# Patient Record
Sex: Female | Born: 1935 | Race: White | Hispanic: No | Marital: Married | Smoking: Never smoker
Health system: Southern US, Community
[De-identification: ages and names within clinical notes are randomized; demographics above are authoritative.]

## PROBLEM LIST (undated history)

## (undated) DIAGNOSIS — M545 Low back pain, unspecified: Secondary | ICD-10-CM

## (undated) DIAGNOSIS — I10 Essential (primary) hypertension: Secondary | ICD-10-CM

## (undated) DIAGNOSIS — I2510 Atherosclerotic heart disease of native coronary artery without angina pectoris: Secondary | ICD-10-CM

## (undated) DIAGNOSIS — E669 Obesity, unspecified: Secondary | ICD-10-CM

## (undated) HISTORY — DX: Low back pain, unspecified: M54.50

## (undated) HISTORY — DX: Obesity, unspecified: E66.9

## (undated) HISTORY — PX: CORONARY STENT PLACEMENT: SHX1402

## (undated) HISTORY — DX: Essential (primary) hypertension: I10

## (undated) HISTORY — DX: Atherosclerotic heart disease of native coronary artery without angina pectoris: I25.10

## (undated) HISTORY — PX: APPENDECTOMY: SHX54

## (undated) HISTORY — DX: Low back pain: M54.5

---

## 2001-08-25 ENCOUNTER — Other Ambulatory Visit: Admission: RE | Admit: 2001-08-25 | Discharge: 2001-08-25 | Payer: Self-pay | Admitting: Internal Medicine

## 2004-09-05 ENCOUNTER — Other Ambulatory Visit: Admission: RE | Admit: 2004-09-05 | Discharge: 2004-09-05 | Payer: Self-pay | Admitting: Internal Medicine

## 2007-09-08 ENCOUNTER — Other Ambulatory Visit: Admission: RE | Admit: 2007-09-08 | Discharge: 2007-09-08 | Payer: Self-pay | Admitting: Internal Medicine

## 2009-04-09 ENCOUNTER — Encounter: Payer: Self-pay | Admitting: Cardiovascular Disease

## 2009-06-04 ENCOUNTER — Ambulatory Visit: Payer: Self-pay | Admitting: Cardiovascular Disease

## 2009-06-04 ENCOUNTER — Encounter (INDEPENDENT_AMBULATORY_CARE_PROVIDER_SITE_OTHER): Payer: Self-pay

## 2009-06-04 DIAGNOSIS — I2510 Atherosclerotic heart disease of native coronary artery without angina pectoris: Secondary | ICD-10-CM | POA: Insufficient documentation

## 2009-06-04 DIAGNOSIS — R072 Precordial pain: Secondary | ICD-10-CM | POA: Insufficient documentation

## 2009-06-04 DIAGNOSIS — I10 Essential (primary) hypertension: Secondary | ICD-10-CM | POA: Insufficient documentation

## 2009-06-05 ENCOUNTER — Inpatient Hospital Stay (HOSPITAL_COMMUNITY): Admission: RE | Admit: 2009-06-05 | Discharge: 2009-06-11 | Payer: Self-pay | Admitting: Cardiovascular Disease

## 2009-06-05 ENCOUNTER — Ambulatory Visit: Payer: Self-pay | Admitting: Cardiovascular Disease

## 2009-06-06 ENCOUNTER — Encounter: Payer: Self-pay | Admitting: Cardiovascular Disease

## 2009-06-10 ENCOUNTER — Encounter: Payer: Self-pay | Admitting: Cardiovascular Disease

## 2009-06-18 ENCOUNTER — Encounter: Payer: Self-pay | Admitting: Cardiovascular Disease

## 2009-06-26 DIAGNOSIS — M109 Gout, unspecified: Secondary | ICD-10-CM | POA: Insufficient documentation

## 2009-06-26 DIAGNOSIS — E669 Obesity, unspecified: Secondary | ICD-10-CM | POA: Insufficient documentation

## 2009-06-26 DIAGNOSIS — E119 Type 2 diabetes mellitus without complications: Secondary | ICD-10-CM | POA: Insufficient documentation

## 2009-06-26 DIAGNOSIS — M545 Low back pain, unspecified: Secondary | ICD-10-CM | POA: Insufficient documentation

## 2009-07-01 ENCOUNTER — Ambulatory Visit: Payer: Self-pay | Admitting: Cardiovascular Disease

## 2009-07-15 ENCOUNTER — Encounter: Payer: Self-pay | Admitting: Cardiovascular Disease

## 2009-07-17 ENCOUNTER — Ambulatory Visit: Payer: Self-pay | Admitting: Cardiovascular Disease

## 2009-07-18 LAB — CONVERTED CEMR LAB
CO2: 27 meq/L (ref 19–32)
Chloride: 109 meq/L (ref 96–112)
Glucose, Bld: 130 mg/dL — ABNORMAL HIGH (ref 70–99)
Sodium: 144 meq/L (ref 135–145)

## 2009-08-05 ENCOUNTER — Encounter: Payer: Self-pay | Admitting: Cardiovascular Disease

## 2009-09-24 ENCOUNTER — Ambulatory Visit: Payer: Self-pay | Admitting: Cardiovascular Disease

## 2009-09-27 DIAGNOSIS — I2589 Other forms of chronic ischemic heart disease: Secondary | ICD-10-CM | POA: Insufficient documentation

## 2009-10-16 ENCOUNTER — Ambulatory Visit: Payer: Self-pay

## 2009-10-16 ENCOUNTER — Ambulatory Visit (HOSPITAL_COMMUNITY): Admission: RE | Admit: 2009-10-16 | Discharge: 2009-10-16 | Payer: Self-pay | Admitting: Cardiovascular Disease

## 2009-10-16 ENCOUNTER — Encounter: Payer: Self-pay | Admitting: Cardiovascular Disease

## 2009-10-16 ENCOUNTER — Ambulatory Visit: Payer: Self-pay | Admitting: Cardiology

## 2010-04-07 ENCOUNTER — Ambulatory Visit: Payer: Self-pay | Admitting: Cardiovascular Disease

## 2010-04-07 DIAGNOSIS — E785 Hyperlipidemia, unspecified: Secondary | ICD-10-CM | POA: Insufficient documentation

## 2010-08-14 NOTE — Miscellaneous (Signed)
Summary: [facility]  [facility]   Imported By: Roderic Ovens 08/09/2009 10:53:30  _____________________________________________________________________  External Attachment:    Type:   Image     Comment:   External Document

## 2010-08-14 NOTE — Miscellaneous (Signed)
Summary: [facility]  [facility]   Imported By: Kassie Mends 08/26/2009 08:32:42  _____________________________________________________________________  External Attachment:    Type:   Image     Comment:   External Document

## 2010-08-14 NOTE — Letter (Signed)
Summary: [facility]  [facility]   Imported By: Marylou Mccoy 07/31/2009 18:01:44  _____________________________________________________________________  External Attachment:    Type:   Image     Comment:   External Document

## 2010-08-14 NOTE — Assessment & Plan Note (Signed)
Summary: ROV   Visit Type:  Follow-up Primary Provider:  Nila Kelly  CC:  No complaints.  History of Present Illness: This is a 77 year-old woman with CAD presenting today for follow-up evaluation. The pt presented for outpatient eval after a prolonged episode of chest pain in November 2010. She was referred for cath, and was diagnosed with a NSTEMI. Cath showed severe LCx stenosis and she was treated with a DES. She was found to have moderate LV dysfunction with LVEF 41% by MRI at the time of her NSTEMI. Followup echo has shown normalization of LV function with an LVEF of 55%.  The patient feels well. She has lost another 13 pounds since her last visit. Remains limited by arthritis. She denies chest pain, dysnpea, or edema. No other complaints.    Current Medications (verified): 1)  Probenecid 500 Mg Tabs (Probenecid) .Marland Kitchen.. 1 Tab Once Daily 2)  Aspirin 81 Mg Tbec (Aspirin) .... Take One Tablet By Mouth Daily 3)  Carvedilol 6.25 Mg Tabs (Carvedilol) .... Take One Tablet By Mouth Twice A Day 4)  Plavix 75 Mg Tabs (Clopidogrel Bisulfate) .... Take One Tablet By Mouth Daily 5)  Lipitor 80 Mg Tabs (Atorvastatin Calcium) .... Take One Tablet By Mouth Daily. 6)  Diovan Hct 160-25 Mg Tabs (Valsartan-Hydrochlorothiazide) .... Take One Tablet By Mouth Daily 7)  Aspirin 81 Mg Tbec (Aspirin) .... Take One Tablet By Mouth Daily  Allergies: 1)  ! Allopurinol 2)  ! Sulfa  Past History:  Past medical history reviewed for relevance to current acute and chronic problems.  Past Medical History: Reviewed history from 07/01/2009 and no changes required. Current Problems:  CORONARY ATHEROSCLEROSIS NATIVE CORONARY ARTERY (ICD-414.01), NSTEMI 11/10, LCx stent HYPERTENSION, BENIGN (ICD-401.1) CHEST PAIN, PRECORDIAL (ICD-786.51) OBESITY (ICD-278.00) DIABETES MELLITUS, TYPE II (ICD-250.00) GOUT, UNSPECIFIED (ICD-274.9) LOW BACK PAIN, CHRONIC (ICD-724.2)  Review of Systems       Negative except as  per HPI   Vital Signs:  Patient profile:   77 year old female Height:      63 inches Weight:      194.50 pounds BMI:     34.58 Pulse rate:   68 / minute Pulse rhythm:   regular Resp:     18 per minute BP sitting:   130 / 78  (left arm) Cuff size:   large  Vitals Entered By: Vikki Ports (April 07, 2010 1:47 PM)  Physical Exam  General:  Pt is alert and oriented, elderly, obese, very pleasant woman, in no acute distress. HEENT: normal Neck: normal carotid upstrokes without bruits, JVP normal Lungs: CTA CV: RRR without murmur or gallop Abd: soft, NT, positive BS, no bruit, no organomegaly Ext: no clubbing, cyanosis, or edema. peripheral pulses 2+ and equal Skin: warm and dry without rash    Echocardiogram  Procedure date:  10/16/2009  Findings:      Study Conclusions            - Left ventricle: The cavity size was normal. Systolic function was       normal. The estimated ejection fraction was in the range of 50% to       55%. Wall motion was normal; there were no regional wall motion       abnormalities. Doppler parameters are consistent with abnormal       left ventricular relaxation (grade 1 diastolic dysfunction).     - Mitral valve: Mild regurgitation.     - Right ventricle: The cavity size was mildly dilated.  Systolic       function was moderately reduced.     - Right atrium: The atrium was mildly dilated.  EKG  Procedure date:  04/07/2010  Findings:      NSR, Low voltage QRS, HR 69 bpm.  Impression & Recommendations:  Problem # 1:  CORONARY ATHEROSCLEROSIS NATIVE CORONARY ARTERY (ICD-414.01) Pt stable without angina. Risk factors well-controlled. She has done a great job with weight loss, expecially considering her inability to exercise. She should continue ASA and plavix for a total of 12 months from her PCI procedure (stop date 06/08/2011).  Her updated medication list for this problem includes:    Aspirin 81 Mg Tbec (Aspirin) .Marland Kitchen... Take one  tablet by mouth daily    Carvedilol 6.25 Mg Tabs (Carvedilol) .Marland Kitchen... Take one tablet by mouth twice a day    Plavix 75 Mg Tabs (Clopidogrel bisulfate) .Marland Kitchen... Take one tablet by mouth daily  Orders: EKG w/ Interpretation (93000)  Problem # 2:  HYPERTENSION, BENIGN (ICD-401.1) Well-controlled.  Her updated medication list for this problem includes:    Aspirin 81 Mg Tbec (Aspirin) .Marland Kitchen... Take one tablet by mouth daily    Carvedilol 6.25 Mg Tabs (Carvedilol) .Marland Kitchen... Take one tablet by mouth twice a day    Diovan Hct 160-25 Mg Tabs (Valsartan-hydrochlorothiazide) .Marland Kitchen... Take one tablet by mouth daily  Orders: EKG w/ Interpretation (93000)  BP today: 130/78 Prior BP: 130/70 (09/24/2009)  Labs Reviewed: K+: 4.3 (07/17/2009) Creat: : 1.3 (07/17/2009)     Problem # 3:  HYPERLIPIDEMIA-MIXED (ICD-272.4) Lipids followed by Dr Chilton Si - he recently increased lipitor to 80 mg.  Her updated medication list for this problem includes:    Lipitor 80 Mg Tabs (Atorvastatin calcium) .Marland Kitchen... Take one tablet by mouth daily.  Patient Instructions: 1)  Your physician recommends that you continue on your current medications as directed. Please refer to the Current Medication list given to you today.  YOU CAN STOP PLAVIX June 07, 2010. 2)  Your physician wants you to follow-up in:  6 MONTHS.  You will receive a reminder letter in the mail two months in advance. If you don't receive a letter, please call our office to schedule the follow-up appointment.

## 2010-08-14 NOTE — Miscellaneous (Signed)
Summary: [facility]  [facility]   Imported By: Roderic Ovens 07/29/2009 12:25:21  _____________________________________________________________________  External Attachment:    Type:   Image     Comment:   External Document

## 2010-08-14 NOTE — Miscellaneous (Signed)
Summary: Home Health Certification/Care Plan   Home Health Certification/Care Plan   Imported By: Roderic Ovens 07/29/2009 12:27:36  _____________________________________________________________________  External Attachment:    Type:   Image     Comment:   External Document

## 2010-08-14 NOTE — Assessment & Plan Note (Signed)
Summary: 3 MONTH ROV   Visit Type:  3 months follow up Primary Provider:  Nila Nephew  CC:  No complaints.  History of Present Illness: This is a 77 year-old woman with CAD presenting today for follow-up evaluation. The pt presented for outpatient eval after a prolonged episode of chest pain in November 2010. She was referred for cath, and was diagnosed with a NSTEMI. Cath showed severe LCx stenosis and she was treated with a DES.  She was found to have moderate LV dysfunction with LVEF 41% by MRI at the time of her NSTEMI.  The pt is doing well at present. She denies chest pain, dyspnea, edema, orthopnea, or PND. She has followed a diet and has lost 18 pounds since her last visit. She's unable to exercise much because of arthritis.  Current Medications (verified): 1)  Probenecid 500 Mg Tabs (Probenecid) .Marland Kitchen.. 1 Tab Once Daily 2)  Amaryl 2 Mg Tabs (Glimepiride) .... 1/2 Tab Once Daily 3)  Aspirin Ec 325 Mg Tbec (Aspirin) .... Take One Tablet By Mouth Daily 4)  Carvedilol 6.25 Mg Tabs (Carvedilol) .... Take One Tablet By Mouth Twice A Day 5)  Plavix 75 Mg Tabs (Clopidogrel Bisulfate) .... Take One Tablet By Mouth Daily 6)  Lipitor 40 Mg Tabs (Atorvastatin Calcium) .... Take One Tablet By Mouth Daily. 7)  Tylenol Extra Strength 500 Mg Tabs (Acetaminophen) .... As Needed 8)  Diovan Hct 160-25 Mg Tabs (Valsartan-Hydrochlorothiazide) .... Take One Tablet By Mouth Daily  Allergies: 1)  ! Allopurinol 2)  ! Sulfa  Past History:  Past medical history reviewed for relevance to current acute and chronic problems.  Past Medical History: Reviewed history from 07/01/2009 and no changes required. Current Problems:  CORONARY ATHEROSCLEROSIS NATIVE CORONARY ARTERY (ICD-414.01), NSTEMI 11/10, LCx stent HYPERTENSION, BENIGN (ICD-401.1) CHEST PAIN, PRECORDIAL (ICD-786.51) OBESITY (ICD-278.00) DIABETES MELLITUS, TYPE II (ICD-250.00) GOUT, UNSPECIFIED (ICD-274.9) LOW BACK PAIN, CHRONIC  (ICD-724.2)  Review of Systems       Positive for joint pains, otherwise negative except as per HPI  Vital Signs:  Patient profile:   77 year old female Height:      63 inches Weight:      206.75 pounds BMI:     36.76 Pulse rate:   72 / minute Pulse rhythm:   regular Resp:     18 per minute BP sitting:   130 / 70  (left arm) Cuff size:   large  Vitals Entered By: Vikki Ports (September 24, 2009 10:27 AM)  Physical Exam  General:  Pt is alert and oriented, elderly, obese, very pleasant woman, in no acute distress. HEENT: normal Neck: normal carotid upstrokes without bruits, JVP normal Lungs: CTA CV: RRR without murmur or gallop Abd: soft, NT, positive BS, no bruit, no organomegaly Ext: no clubbing, cyanosis, or edema. peripheral pulses 2+ and equal Skin: warm and dry without rash    Impression & Recommendations:  Problem # 1:  CORONARY ATHEROSCLEROSIS NATIVE CORONARY ARTERY (ICD-414.01) Stable without angina. Continue current medical therapy. Encouraged continued diet and as much exercise as she can tolerate.  Her updated medication list for this problem includes:    Aspirin 81 Mg Tbec (Aspirin) .Marland Kitchen... Take one tablet by mouth daily    Carvedilol 6.25 Mg Tabs (Carvedilol) .Marland Kitchen... Take one tablet by mouth twice a day    Plavix 75 Mg Tabs (Clopidogrel bisulfate) .Marland Kitchen... Take one tablet by mouth daily  Orders: Echocardiogram (Echo)  Problem # 2:  HYPERTENSION, BENIGN (ICD-401.1) well-controlled.  Her  updated medication list for this problem includes:    Aspirin 81 Mg Tbec (Aspirin) .Marland Kitchen... Take one tablet by mouth daily    Carvedilol 6.25 Mg Tabs (Carvedilol) .Marland Kitchen... Take one tablet by mouth twice a day    Diovan Hct 160-25 Mg Tabs (Valsartan-hydrochlorothiazide) .Marland Kitchen... Take one tablet by mouth daily  Orders: Echocardiogram (Echo)  BP today: 130/70 Prior BP: 165/82 (07/01/2009)  Labs Reviewed: K+: 4.3 (07/17/2009) Creat: : 1.3 (07/17/2009)     Problem # 3:   CARDIOMYOPATHY, ISCHEMIC (ICD-414.8) LVEF about 40% at the time of her infarct. Will reassess with echo. Pt on appropriate Rx with Coreg and ARB. Her updated medication list for this problem includes:    Aspirin 81 Mg Tbec (Aspirin) .Marland Kitchen... Take one tablet by mouth daily    Carvedilol 6.25 Mg Tabs (Carvedilol) .Marland Kitchen... Take one tablet by mouth twice a day    Plavix 75 Mg Tabs (Clopidogrel bisulfate) .Marland Kitchen... Take one tablet by mouth daily    Diovan Hct 160-25 Mg Tabs (Valsartan-hydrochlorothiazide) .Marland Kitchen... Take one tablet by mouth daily  Patient Instructions: 1)  Your physician wants you to follow-up in:   6 MONTHS. You will receive a reminder letter in the mail two months in advance. If you don't receive a letter, please call our office to schedule the follow-up appointment. 2)  Your physician has requested that you have an echocardiogram.  Echocardiography is a painless test that uses sound waves to create images of your heart. It provides your doctor with information about the size and shape of your heart and how well your heart's chambers and valves are working.  This procedure takes approximately one hour. There are no restrictions for this procedure. 3)  Your physician has recommended you make the following change in your medication: DECREASE Aspirin to 81mg  once a day

## 2010-10-11 ENCOUNTER — Emergency Department (HOSPITAL_COMMUNITY): Payer: Medicare Other

## 2010-10-11 ENCOUNTER — Inpatient Hospital Stay (HOSPITAL_COMMUNITY)
Admission: EM | Admit: 2010-10-11 | Discharge: 2010-10-15 | DRG: 392 | Disposition: A | Discharge status: Home Health | Payer: Medicare Other | Attending: Internal Medicine | Admitting: Internal Medicine

## 2010-10-11 DIAGNOSIS — K2970 Gastritis, unspecified, without bleeding: Principal | ICD-10-CM | POA: Diagnosis present

## 2010-10-11 DIAGNOSIS — Z9849 Cataract extraction status, unspecified eye: Secondary | ICD-10-CM

## 2010-10-11 DIAGNOSIS — N289 Disorder of kidney and ureter, unspecified: Secondary | ICD-10-CM | POA: Diagnosis present

## 2010-10-11 DIAGNOSIS — R0789 Other chest pain: Secondary | ICD-10-CM | POA: Diagnosis present

## 2010-10-11 DIAGNOSIS — E669 Obesity, unspecified: Secondary | ICD-10-CM | POA: Diagnosis present

## 2010-10-11 DIAGNOSIS — N189 Chronic kidney disease, unspecified: Secondary | ICD-10-CM | POA: Diagnosis present

## 2010-10-11 DIAGNOSIS — D696 Thrombocytopenia, unspecified: Secondary | ICD-10-CM | POA: Diagnosis not present

## 2010-10-11 DIAGNOSIS — I129 Hypertensive chronic kidney disease with stage 1 through stage 4 chronic kidney disease, or unspecified chronic kidney disease: Secondary | ICD-10-CM | POA: Diagnosis present

## 2010-10-11 DIAGNOSIS — M545 Low back pain, unspecified: Secondary | ICD-10-CM | POA: Diagnosis present

## 2010-10-11 DIAGNOSIS — K219 Gastro-esophageal reflux disease without esophagitis: Secondary | ICD-10-CM | POA: Diagnosis present

## 2010-10-11 DIAGNOSIS — K811 Chronic cholecystitis: Secondary | ICD-10-CM | POA: Diagnosis present

## 2010-10-11 DIAGNOSIS — R072 Precordial pain: Secondary | ICD-10-CM

## 2010-10-11 DIAGNOSIS — I2510 Atherosclerotic heart disease of native coronary artery without angina pectoris: Secondary | ICD-10-CM | POA: Diagnosis present

## 2010-10-11 DIAGNOSIS — M109 Gout, unspecified: Secondary | ICD-10-CM | POA: Diagnosis present

## 2010-10-11 DIAGNOSIS — Z9861 Coronary angioplasty status: Secondary | ICD-10-CM

## 2010-10-11 DIAGNOSIS — R7309 Other abnormal glucose: Secondary | ICD-10-CM | POA: Diagnosis present

## 2010-10-11 DIAGNOSIS — D638 Anemia in other chronic diseases classified elsewhere: Secondary | ICD-10-CM | POA: Diagnosis present

## 2010-10-11 DIAGNOSIS — I252 Old myocardial infarction: Secondary | ICD-10-CM

## 2010-10-11 DIAGNOSIS — E538 Deficiency of other specified B group vitamins: Secondary | ICD-10-CM | POA: Diagnosis present

## 2010-10-11 DIAGNOSIS — G8929 Other chronic pain: Secondary | ICD-10-CM | POA: Diagnosis present

## 2010-10-11 LAB — COMPREHENSIVE METABOLIC PANEL
ALT: 17 U/L (ref 0–35)
Albumin: 3.6 g/dL (ref 3.5–5.2)
Alkaline Phosphatase: 110 U/L (ref 39–117)
BUN: 37 mg/dL — ABNORMAL HIGH (ref 6–23)
Chloride: 106 mEq/L (ref 96–112)
Glucose, Bld: 114 mg/dL — ABNORMAL HIGH (ref 70–99)
Potassium: 4.4 mEq/L (ref 3.5–5.1)
Sodium: 139 mEq/L (ref 135–145)
Total Bilirubin: 0.5 mg/dL (ref 0.3–1.2)

## 2010-10-11 LAB — CK TOTAL AND CKMB (NOT AT ARMC)
CK, MB: 1.4 ng/mL (ref 0.3–4.0)
Relative Index: INVALID (ref 0.0–2.5)
Total CK: 72 U/L (ref 7–177)

## 2010-10-11 LAB — BASIC METABOLIC PANEL
Calcium: 8.6 mg/dL (ref 8.4–10.5)
GFR calc Af Amer: 39 mL/min — ABNORMAL LOW (ref >60)
GFR calc non Af Amer: 32 mL/min — ABNORMAL LOW (ref >60)
Potassium: 4.5 mEq/L (ref 3.5–5.1)
Sodium: 143 mEq/L (ref 135–145)

## 2010-10-11 LAB — CARDIAC PANEL(CRET KIN+CKTOT+MB+TROPI)
CK, MB: 1.3 ng/mL (ref 0.3–4.0)
Total CK: 55 U/L (ref 7–177)

## 2010-10-11 LAB — CBC
HCT: 32.2 % — ABNORMAL LOW (ref 36.0–46.0)
Hemoglobin: 10.3 g/dL — ABNORMAL LOW (ref 12.0–15.0)
Platelets: 151 10*3/uL (ref 150–400)
RBC: 3.35 MIL/uL — ABNORMAL LOW (ref 3.87–5.11)
RDW: 13.2 % (ref 11.5–15.5)

## 2010-10-11 LAB — TROPONIN I: Troponin I: 0.01 ng/mL (ref 0.00–0.06)

## 2010-10-11 LAB — GLUCOSE, CAPILLARY
Glucose-Capillary: 121 mg/dL — ABNORMAL HIGH (ref 70–99)
Glucose-Capillary: 152 mg/dL — ABNORMAL HIGH (ref 70–99)

## 2010-10-12 DIAGNOSIS — I4891 Unspecified atrial fibrillation: Secondary | ICD-10-CM

## 2010-10-12 LAB — HEMOGLOBIN AND HEMATOCRIT, BLOOD: Hemoglobin: 8.5 g/dL — ABNORMAL LOW (ref 12.0–15.0)

## 2010-10-12 LAB — CBC
Hemoglobin: 8.7 g/dL — ABNORMAL LOW (ref 12.0–15.0)
MCH: 31.4 pg (ref 26.0–34.0)
Platelets: 125 10*3/uL — ABNORMAL LOW (ref 150–400)
RBC: 2.77 MIL/uL — ABNORMAL LOW (ref 3.87–5.11)
WBC: 11.2 10*3/uL — ABNORMAL HIGH (ref 4.0–10.5)

## 2010-10-12 LAB — GLUCOSE, CAPILLARY
Glucose-Capillary: 121 mg/dL — ABNORMAL HIGH (ref 70–99)
Glucose-Capillary: 90 mg/dL (ref 70–99)
Glucose-Capillary: 156 mg/dL — ABNORMAL HIGH (ref 70–99)

## 2010-10-13 ENCOUNTER — Other Ambulatory Visit: Payer: Self-pay | Admitting: Gastroenterology

## 2010-10-13 DIAGNOSIS — D649 Anemia, unspecified: Secondary | ICD-10-CM

## 2010-10-13 DIAGNOSIS — K2970 Gastritis, unspecified, without bleeding: Secondary | ICD-10-CM

## 2010-10-13 DIAGNOSIS — K2990 Gastroduodenitis, unspecified, without bleeding: Secondary | ICD-10-CM

## 2010-10-13 DIAGNOSIS — R1013 Epigastric pain: Secondary | ICD-10-CM

## 2010-10-13 DIAGNOSIS — R079 Chest pain, unspecified: Secondary | ICD-10-CM

## 2010-10-13 LAB — HEMOGLOBIN AND HEMATOCRIT, BLOOD: HCT: 24.7 % — ABNORMAL LOW (ref 36.0–46.0)

## 2010-10-13 LAB — CBC
Hemoglobin: 8 g/dL — ABNORMAL LOW (ref 12.0–15.0)
Platelets: 120 10*3/uL — ABNORMAL LOW (ref 150–400)
RBC: 2.58 MIL/uL — ABNORMAL LOW (ref 3.87–5.11)
WBC: 8.1 10*3/uL (ref 4.0–10.5)

## 2010-10-13 LAB — GLUCOSE, CAPILLARY: Glucose-Capillary: 132 mg/dL — ABNORMAL HIGH (ref 70–99)

## 2010-10-13 LAB — CARDIAC PANEL(CRET KIN+CKTOT+MB+TROPI)
CK, MB: 1.8 ng/mL (ref 0.3–4.0)
Relative Index: 1.6 (ref 0.0–2.5)
Total CK: 110 U/L (ref 7–177)
Troponin I: 0.05 ng/mL (ref 0.00–0.06)

## 2010-10-13 LAB — DIFFERENTIAL
Basophils Absolute: 0 10*3/uL (ref 0.0–0.1)
Basophils Relative: 0 % (ref 0–1)
Eosinophils Absolute: 0.2 10*3/uL (ref 0.0–0.7)
Monocytes Relative: 8 % (ref 3–12)
Neutro Abs: 6.4 10*3/uL (ref 1.7–7.7)
Neutrophils Relative %: 79 % — ABNORMAL HIGH (ref 43–77)

## 2010-10-14 ENCOUNTER — Inpatient Hospital Stay (HOSPITAL_COMMUNITY): Payer: Medicare Other

## 2010-10-14 DIAGNOSIS — K2990 Gastroduodenitis, unspecified, without bleeding: Secondary | ICD-10-CM

## 2010-10-14 DIAGNOSIS — K8000 Calculus of gallbladder with acute cholecystitis without obstruction: Secondary | ICD-10-CM

## 2010-10-14 DIAGNOSIS — K2970 Gastritis, unspecified, without bleeding: Secondary | ICD-10-CM

## 2010-10-14 LAB — CBC
Hemoglobin: 8.4 g/dL — ABNORMAL LOW (ref 12.0–15.0)
MCH: 31.1 pg (ref 26.0–34.0)
MCV: 95.9 fL (ref 78.0–100.0)
Platelets: 130 10*3/uL — ABNORMAL LOW (ref 150–400)
RBC: 2.7 MIL/uL — ABNORMAL LOW (ref 3.87–5.11)
WBC: 6.4 10*3/uL (ref 4.0–10.5)

## 2010-10-14 LAB — VITAMIN B12: Vitamin B-12: 178 pg/mL — ABNORMAL LOW (ref 211–911)

## 2010-10-14 LAB — BASIC METABOLIC PANEL
BUN: 42 mg/dL — ABNORMAL HIGH (ref 6–23)
CO2: 26 mEq/L (ref 19–32)
Chloride: 105 mEq/L (ref 96–112)
Potassium: 4.4 mEq/L (ref 3.5–5.1)

## 2010-10-14 LAB — FERRITIN: Ferritin: 264 ng/mL (ref 10–291)

## 2010-10-14 LAB — IRON AND TIBC
Iron: 32 ug/dL — ABNORMAL LOW (ref 42–135)
Saturation Ratios: 17 % — ABNORMAL LOW (ref 20–55)
UIBC: 151 ug/dL

## 2010-10-14 LAB — APTT: aPTT: 39 seconds — ABNORMAL HIGH (ref 24–37)

## 2010-10-15 DIAGNOSIS — R079 Chest pain, unspecified: Secondary | ICD-10-CM

## 2010-10-15 DIAGNOSIS — K8000 Calculus of gallbladder with acute cholecystitis without obstruction: Secondary | ICD-10-CM

## 2010-10-15 LAB — CBC
HCT: 25.5 % — ABNORMAL LOW (ref 36.0–46.0)
HCT: 26.2 % — ABNORMAL LOW (ref 36.0–46.0)
HCT: 27.1 % — ABNORMAL LOW (ref 36.0–46.0)
HCT: 28.1 % — ABNORMAL LOW (ref 36.0–46.0)
HCT: 30.1 % — ABNORMAL LOW (ref 36.0–46.0)
Hemoglobin: 10.3 g/dL — ABNORMAL LOW (ref 12.0–15.0)
Hemoglobin: 9 g/dL — ABNORMAL LOW (ref 12.0–15.0)
Hemoglobin: 9.6 g/dL — ABNORMAL LOW (ref 12.0–15.0)
MCHC: 32.5 g/dL (ref 30.0–36.0)
MCHC: 33.9 g/dL (ref 30.0–36.0)
MCHC: 34.2 g/dL (ref 30.0–36.0)
MCHC: 34.2 g/dL (ref 30.0–36.0)
MCV: 95.5 fL (ref 78.0–100.0)
MCV: 97.6 fL (ref 78.0–100.0)
MCV: 97.7 fL (ref 78.0–100.0)
MCV: 98.1 fL (ref 78.0–100.0)
MCV: 98.5 fL (ref 78.0–100.0)
MCV: 98.7 fL (ref 78.0–100.0)
Platelets: 132 10*3/uL — ABNORMAL LOW (ref 150–400)
Platelets: 168 10*3/uL (ref 150–400)
Platelets: 194 10*3/uL (ref 150–400)
Platelets: 195 10*3/uL (ref 150–400)
RBC: 2.88 MIL/uL — ABNORMAL LOW (ref 3.87–5.11)
RBC: 3.06 MIL/uL — ABNORMAL LOW (ref 3.87–5.11)
RDW: 13.6 % (ref 11.5–15.5)
RDW: 12.9 % (ref 11.5–15.5)
RDW: 13 % (ref 11.5–15.5)
RDW: 13.2 % (ref 11.5–15.5)
RDW: 13.4 % (ref 11.5–15.5)
RDW: 13.6 % (ref 11.5–15.5)
WBC: 6.9 10*3/uL (ref 4.0–10.5)
WBC: 7.1 10*3/uL (ref 4.0–10.5)
WBC: 8.2 10*3/uL (ref 4.0–10.5)

## 2010-10-15 LAB — COMPREHENSIVE METABOLIC PANEL
AST: 34 U/L (ref 0–37)
Albumin: 2.8 g/dL — ABNORMAL LOW (ref 3.5–5.2)
Alkaline Phosphatase: 77 U/L (ref 39–117)
BUN: 34 mg/dL — ABNORMAL HIGH (ref 6–23)
BUN: 38 mg/dL — ABNORMAL HIGH (ref 6–23)
CO2: 28 mEq/L (ref 19–32)
Calcium: 8.3 mg/dL — ABNORMAL LOW (ref 8.4–10.5)
Chloride: 105 mEq/L (ref 96–112)
GFR calc non Af Amer: 20 mL/min — ABNORMAL LOW (ref >60)
Glucose, Bld: 141 mg/dL — ABNORMAL HIGH (ref 70–99)
Potassium: 4.2 mEq/L (ref 3.5–5.1)
Total Bilirubin: 0.7 mg/dL (ref 0.3–1.2)
Total Protein: 5.7 g/dL — ABNORMAL LOW (ref 6.0–8.3)

## 2010-10-15 LAB — BASIC METABOLIC PANEL
BUN: 37 mg/dL — ABNORMAL HIGH (ref 6–23)
BUN: 21 mg/dL (ref 6–23)
BUN: 36 mg/dL — ABNORMAL HIGH (ref 6–23)
BUN: 38 mg/dL — ABNORMAL HIGH (ref 6–23)
BUN: 39 mg/dL — ABNORMAL HIGH (ref 6–23)
CO2: 22 mEq/L (ref 19–32)
Calcium: 8.3 mg/dL — ABNORMAL LOW (ref 8.4–10.5)
Calcium: 8.2 mg/dL — ABNORMAL LOW (ref 8.4–10.5)
Calcium: 8.4 mg/dL (ref 8.4–10.5)
Chloride: 101 mEq/L (ref 96–112)
Chloride: 104 mEq/L (ref 96–112)
Chloride: 108 mEq/L (ref 96–112)
Creatinine, Ser: 1.41 mg/dL — ABNORMAL HIGH (ref 0.4–1.2)
Creatinine, Ser: 1.43 mg/dL — ABNORMAL HIGH (ref 0.4–1.2)
Creatinine, Ser: 1.79 mg/dL — ABNORMAL HIGH (ref 0.4–1.2)
Creatinine, Ser: 2.28 mg/dL — ABNORMAL HIGH (ref 0.4–1.2)
GFR calc non Af Amer: 31 mL/min — ABNORMAL LOW (ref >60)
GFR calc non Af Amer: 21 mL/min — ABNORMAL LOW (ref >60)
GFR calc non Af Amer: 28 mL/min — ABNORMAL LOW (ref >60)
GFR calc non Af Amer: 32 mL/min — ABNORMAL LOW (ref >60)
Glucose, Bld: 142 mg/dL — ABNORMAL HIGH (ref 70–99)
Glucose, Bld: 156 mg/dL — ABNORMAL HIGH (ref 70–99)
Glucose, Bld: 163 mg/dL — ABNORMAL HIGH (ref 70–99)
Glucose, Bld: 175 mg/dL — ABNORMAL HIGH (ref 70–99)
Potassium: 5 mEq/L (ref 3.5–5.1)
Sodium: 140 mEq/L (ref 135–145)

## 2010-10-15 LAB — BRAIN NATRIURETIC PEPTIDE: Pro B Natriuretic peptide (BNP): 918 pg/mL — ABNORMAL HIGH (ref 0.0–100.0)

## 2010-10-15 LAB — APTT: aPTT: 27 seconds (ref 24–37)

## 2010-10-15 LAB — MAGNESIUM
Magnesium: 1.4 mg/dL — ABNORMAL LOW (ref 1.5–2.5)
Magnesium: 1.6 mg/dL (ref 1.5–2.5)

## 2010-10-15 LAB — GLUCOSE, CAPILLARY
Glucose-Capillary: 127 mg/dL — ABNORMAL HIGH (ref 70–99)
Glucose-Capillary: 129 mg/dL — ABNORMAL HIGH (ref 70–99)
Glucose-Capillary: 136 mg/dL — ABNORMAL HIGH (ref 70–99)
Glucose-Capillary: 141 mg/dL — ABNORMAL HIGH (ref 70–99)
Glucose-Capillary: 159 mg/dL — ABNORMAL HIGH (ref 70–99)
Glucose-Capillary: 165 mg/dL — ABNORMAL HIGH (ref 70–99)
Glucose-Capillary: 167 mg/dL — ABNORMAL HIGH (ref 70–99)
Glucose-Capillary: 95 mg/dL (ref 70–99)

## 2010-10-15 LAB — CARDIAC PANEL(CRET KIN+CKTOT+MB+TROPI)
CK, MB: 22.5 ng/mL — ABNORMAL HIGH (ref 0.3–4.0)
Relative Index: 6.2 — ABNORMAL HIGH (ref 0.0–2.5)
Relative Index: 7.9 — ABNORMAL HIGH (ref 0.0–2.5)
Relative Index: 7.9 — ABNORMAL HIGH (ref 0.0–2.5)
Total CK: 303 U/L — ABNORMAL HIGH (ref 7–177)
Troponin I: 12.63 ng/mL (ref 0.00–0.06)

## 2010-10-15 LAB — FERRITIN: Ferritin: 228 ng/mL (ref 10–291)

## 2010-10-15 LAB — DIFFERENTIAL
Basophils Absolute: 0 10*3/uL (ref 0.0–0.1)
Basophils Relative: 0 % (ref 0–1)
Eosinophils Absolute: 0.1 10*3/uL (ref 0.0–0.7)
Lymphs Abs: 1 10*3/uL (ref 0.7–4.0)
Neutrophils Relative %: 76 % (ref 43–77)

## 2010-10-15 LAB — PROTIME-INR
INR: 1.1 (ref 0.00–1.49)
Prothrombin Time: 14.1 seconds (ref 11.6–15.2)

## 2010-10-15 LAB — IRON AND TIBC: TIBC: 179 ug/dL — ABNORMAL LOW (ref 250–470)

## 2010-10-15 LAB — HEMOGLOBIN A1C
Hgb A1c MFr Bld: 6.7 % — ABNORMAL HIGH (ref 4.6–6.1)
Mean Plasma Glucose: 146 mg/dL

## 2010-10-15 LAB — TSH: TSH: 3.085 u[IU]/mL (ref 0.350–4.500)

## 2010-10-15 LAB — LIPID PANEL: VLDL: 33 mg/dL (ref 0–40)

## 2010-10-15 LAB — HEPARIN LEVEL (UNFRACTIONATED): Heparin Unfractionated: 0.37 IU/mL (ref 0.30–0.70)

## 2010-10-16 NOTE — Discharge Summary (Addendum)
NAMEJAYLIN, [name]                  ACCOUNT NO.:  [id number]  MEDICAL RECORD NO.:  [id number]           PATIENT TYPE:  I  LOCATION:  3735                         FACILITY:  [facility]  PHYSICIAN:  Jarquavious Fentress M. [place], M.D.  DATE OF BIRTH:  Mar 15, 1931  DATE OF ADMISSION:  10/11/2010 DATE OF DISCHARGE:  10/15/2010                              DISCHARGE SUMMARY   DISCHARGE DIAGNOSES: 1. Chest pain, felt secondary to gastritis by EGD October 13, 2010, with     negative pathology for Helicobacter pylori/malignancy. 2. Possible chronic cholecystitis, for referral to Surgery for     possible laparoscopic cholecystectomy. 3. Coronary artery disease, status post non-ST elevation myocardial     infarction on November 2010, status post percutaneous transluminal     coronary angioplasty/drug-eluting stent placement to the left     circumflex.     a.     Cardiac enzymes negative x2 with third set with a troponin      of 0.09 and fourth set completely negative, not felt to reflect      ischemia.     b.     Aspirin on hold, to be resumed when okay with GI. 4. Acute-on-chronic renal insufficiency with discharge creatinine of     1.61. 5. Anemia, with panel reflective of anemia of chronic disease with     possibly component of iron deficiency/B12 deficiency, for followup     with PCP. 6. Obesity. 7. Hypertension. 8. History of gout. 9. Prediabetes with possible conversion to diabetes with blood sugars     running from 90 to 180 at this admission, for outpatient followup     with PCP. 10.Chronic low back pain. 11.History of appendectomy.  HOSPITAL COURSE:  [name] is a 77 year old female with a prior history of CAD, hypertension, possible prediabetes, who presented to [facility], complaints of substernal chest pain compatible with symptoms of prior angina.  It was a steady discomfort, but not associated with any diaphoresis or shortness of breath.  She had an episode of GERD and water brash  that evening related to having a hamburger later on that day.  Her initial cardiac enzymes were negative and EKG showed sinus rhythm with low voltage without acute changes.  Her chest pain was felt somewhat atypical, possibly related to GERD symptoms.  Second set of cardiac markers came back negative and third set showed a troponin of 0.09, but this was felt not to reflect acute ischemia.  She was also found to be anemic with a hemoglobin of 8.7 and thrombocytopenia with the platelet count in the 120s to 130s.  The Lovenox that she had been initially started on for possible unstable angina was discontinued and aspirin was also discontinued.  GI was called, she was seen in consultation by [facility].  She was prescribed PPI therapy and felt she is a suitable candidate for EGD with compelling symptoms.  Rectal exam was negative by Laura Moccasin, [name].  EGD was on October 13, 2010, which showed moderate gastritis, spontaneously oozing of blood, felt to be able to explain a source of abdominal pain/chest pain.  Pathology showed ulcerated and reactive gastric antral mucosa with no evidence for H. pylori, metaplasia, dysplasia, or malignancy.  An abdominal ultrasound was also obtained, in the setting of normal LFTs, which demonstrated abnormal gallbladder with thickened wall and abundant internal sludge and small stones, suspicion for cholecystitis was elicited by the report, however, her findings were felt more consistent with chronic cholecystitis.  She was felt to benefit from a surgical referral for laparoscopic cholecystectomy.  Anemia panel was drawn which did show evidence for decreased iron, but decreased percent saturation and TIBC as well as a low B12 indicating possibly more so anemia of chronic disease.  The patient continued to do well without complaints of chest pain, nausea, vomiting, shortness of breath.  Her outpatient appointments were set up with GI and Surgery.  She  already has an appointment with Dr. Excell Seltzer.  Dr. [place] has seen and examined her today and feels she is stable for discharge.  DISCHARGE LABS:  WBC 5.7, hemoglobin 8.3, hematocrit 25.5, platelet count 132.  Sodium 142, potassium 4.1, chloride 106, CO2 27, glucose 142, BUN 37, creatinine 1.61.  LFTs were normal on October 11, 2010.  Iron 32, TIBC 183, percent saturation 17, UIBC 151, vitamin B12 178, folate 7.3, ferritin 264.  STUDIES: 1. Chest x-ray, October 11, 2010, showed low lung volumes without acute     findings. 2. Abdominal ultrasound, October 10, 2010, showed abnormal gallbladder     with thickened wall and abundant internal sludge and small  stones.     No sonographic Eulah Pont sign could be elicited.  Clinical correlation     recommended the appearance suspicious for acute cholecystitis,     renal atrophy. 3. EGD, February 13, 2011, see hospital course for summary.  DISCHARGE MEDICATIONS: 1. Ferrous sulfate 325 mg daily. 2. Nitroglycerin sublingual 0.4 mg every 5 minutes as needed up to 3     doses for chest pain. 3. Protonix 40 mg daily. 4. Atorvastatin 80 mg nightly. 5. Coreg 6.25 mg b.i.d. with meals. 6. Diovan HCT 160/25 mg daily. 7. Systane eye drops OTC 1 drop both eyes daily as needed.  Please note, the patient's sildenafil was discontinued secondary to creatinine clearance of less than 50, estimated at 30 at the time of discharge.  Aspirin was also discontinued and will be restarted at the discussion of when Gastroenterology says this is okay.  She had not been on Plavix for several months.  DISPOSITION:  [name] will be discharged in stable condition to home. She has been instructed to increase activity slowly and follow a low- sodium heart-healthy diabetic diet.  She will follow up with Dr. Magnus Ivan on October 27, 2010, at 9:30 a.m. for her surgical consult regarding possible laparoscopic cholecystectomy for chronic cholecystitis.  She will follow up with Dr. Melvia Heaps once she has recovered from surgery.  She will follow up with Dr. Excell Seltzer as previously scheduled on October 29, 2010, at 8:45 a.m.  She was also instructed to follow up with her primary care provider in 1-2 weeks to monitor and evaluation of her kidney disease as well as anemia and diabetes.  DURATION OF DISCHARGE ENCOUNTER:  Greater than 30 minutes including physician and PA time.     Ronie Spies, P.A.C.   ______________________________ Laurabeth Yip M. [place], M.D.    DD/MEDQ  D:  10/15/2010  T:  10/16/2010  Job:  [id number]  cc:   Veverly Fells. Excell Seltzer, MD Abigail Miyamoto, M.D. Barbette Hair. Arlyce Dice, MD,FACG Lenon Curt Chilton Si, M.D.  Electronically Signed by Ronie Spies  on 10/16/2010 01:31:06 PM Electronically Signed by Jamelle Noy [place] M.D. on 10/28/2010 03:14:33 PM

## 2010-10-22 ENCOUNTER — Encounter: Payer: Self-pay | Admitting: Cardiovascular Disease

## 2010-10-22 NOTE — H&P (Signed)
[name], [name]                  ACCOUNT NO.:  [id number]  MEDICAL RECORD NO.:  [id number]           PATIENT TYPE:  E  LOCATION:  [facility]                         FACILITY:  [facility]  PHYSICIAN:  Colleen Can. Deborah Chalk, M.D.DATE OF BIRTH:  [birth year]  DATE OF ADMISSION:  10/11/2010 DATE OF DISCHARGE:                             HISTORY & PHYSICAL   REASON FOR ADMISSION:  Substernal chest pain.  HISTORY:  [name] is a 77 year old white female who presented with substernal chest pain that woke her at 4:50 a.m.  It is compatible with the symptoms of previous angina.  A substernal discomfort that was steady and radiated to her back and was associated with nausea.  She did not have diaphoresis or really significant shortness of breath.  She had an episode of gastroesophageal reflux and water brash that evening before she went to bed related to having a hamburger late in the day. She does have a history of drug-eluting stent to the left circumflex in November 2010.  It was a 2.5 x 15 mm Xience stent.  She has had renal insufficiency and a history of diabetes.  She does have some decreased mobility but she has lost 30 pounds since her heart attack and has lost her glucose intolerance.  PAST MEDICAL HISTORY:  She has pre diabetes.  Her blood sugars normalized with weight loss over the last 2 years.  She has had mild hypertension, yesterday blood pressure was 140/80 at a physical exam that she had.  She did have a physical exam with Dr. Murray Hodgkins yesterday.  She does have a history of gout.  PREVIOUS SURGERIES:  Stent in the left circumflex in 2010.  ALLERGIES:  ALLOPURINOL and SULFA.  FAMILY HISTORY:  Father died at age 56 of myocardial infarction.  Mother had a stroke at age 25 and died.  She has four brothers and one sister who are alive and well.  SOCIAL HISTORY:  She is married.  She has three children.  She previously worked at a lab at [employer], there are no cigarettes  or tobacco.  REVIEW OF SYSTEMS:  Review of systems are as discussed above.  All other findings are negative.  PHYSICAL EXAMINATION:  VITAL SIGNS:  Weight is not checked.  Blood pressure is 150/70, heart rates 78. HEENT:  Negative.  Pupils equal, round, and reactive to light.  Sclerae are nonicteric.  Oropharynx is clear. NECK:  Supple without bruits. LUNGS:  Reasonably clear with few crackles at the bases. HEART:  A regular rate and rhythm without gallop.  There is no murmur. ABDOMEN:  Obese. EXTREMITIES:  Without edema. NEUROLOGIC:  She is intact.  EKG shows sinus with low-voltage  Chest x-ray is unremarkable.  OVERALL IMPRESSION: 1. Chest pain, question of recurrent angina versus gastroesophageal     reflux. 2. Obesity. 3. Hypertension. 4. History of gout. 5. Pre diabetes.  PLAN:  We will admit to rule out myocardial infarction.  If it will be negative rule out for myocardial infarction, no recurrence of chest pain, then discharge tomorrow with plans for an outpatient Myoview.  She does have  a routine followup visit with Dr. Excell Seltzer on October 29, 2010, which would make a good post Myoview followup.  Our office visit already scheduled.  I think she is at low risk for this being recurrent ischemia.     Colleen Can. Deborah Chalk, M.D.     SNT/MEDQ  D:  10/11/2010  T:  10/12/2010  Job:  [id number]  cc:   Veverly Fells. Excell Seltzer, MD  Electronically Signed by Roger Shelter M.D. on 10/22/2010 11:16:34 AM

## 2010-10-29 ENCOUNTER — Encounter: Payer: Self-pay | Admitting: Cardiovascular Disease

## 2010-10-29 ENCOUNTER — Ambulatory Visit (INDEPENDENT_AMBULATORY_CARE_PROVIDER_SITE_OTHER): Payer: Medicare Other | Admitting: Cardiovascular Disease

## 2010-10-29 VITALS — BP 138/78 | HR 68 | Resp 18 | Ht 63.0 in | Wt 190.0 lb

## 2010-10-29 DIAGNOSIS — E785 Hyperlipidemia, unspecified: Secondary | ICD-10-CM

## 2010-10-29 DIAGNOSIS — I10 Essential (primary) hypertension: Secondary | ICD-10-CM

## 2010-10-29 DIAGNOSIS — I2510 Atherosclerotic heart disease of native coronary artery without angina pectoris: Secondary | ICD-10-CM

## 2010-10-29 NOTE — Assessment & Plan Note (Signed)
Lipids are followed by Dr. Chilton Si. Goal LDL is less than 100 and ideally less than 70.

## 2010-10-29 NOTE — Assessment & Plan Note (Signed)
The patient is stable without angina. Her EKG is unremarkable. She has had normalization of LV function. She has tolerated being off of both aspirin and Plavix without recurrent ischemic events. I think she can proceed with cholecystectomy without further cardiac evaluation. She reportedly has had an EGD for investigation of anemia but still require a colonoscopy. Once this investigation is complete I think she should resume aspirin 81 mg daily. Otherwise we'll continue her current medical program without changes.

## 2010-10-29 NOTE — Patient Instructions (Signed)
Your physician recommends that you continue on your current medications as directed. Please refer to the Current Medication list given to you today.  Your physician wants you to follow-up in: 1 YEAR.  You will receive a reminder letter in the mail two months in advance. If you don't receive a letter, please call our office to schedule the follow-up appointment.  You are cleared from a cardiac standpoint for upcoming Gallbladder surgery.

## 2010-10-29 NOTE — Progress Notes (Signed)
HPI:  This is a 77 year old woman presenting for preoperative cardiovascular evaluation. She has been followed here since 2010.  The patient presented with non-ST elevation infarction and was found to have severe proximal left circumflex stenosis, treated with a drug-eluting stent platform in November 2010.  From a cardiovascular standpoint she has done well since her interventional procedure and has had no recurrent ischemia.  The patient had 2 episodes of abdominal pain radiating to the back. She's had some pain into the right chest as well. These episodes and associated with nausea, vomiting, and diarrhea. A gallbladder ultrasound showed changes consistent with chronic cholecystitis and she has been referred for laparoscopic cholecystectomy.  The patient denies chest pain or pressure with activity. She denies dyspnea, edema, orthopnea, PND, or palpitations.   Outpatient Encounter Prescriptions as of 10/29/2010  Medication Sig Dispense Refill  . atorvastatin (LIPITOR) 80 MG tablet Take 80 mg by mouth daily.        . carvedilol (COREG) 6.25 MG tablet Take 6.25 mg by mouth 2 (two) times daily with a meal.        . ferrous sulfate 325 (65 FE) MG tablet Take 325 mg by mouth daily with breakfast.        . pantoprazole (PROTONIX) 40 MG tablet Take 40 mg by mouth daily.        . valsartan-hydrochlorothiazide (DIOVAN-HCT) 160-25 MG per tablet Take 1 tablet by mouth daily.        . probenecid (BENEMID) 500 MG tablet Take 500 mg by mouth daily.        Marland Kitchen DISCONTD: aspirin 81 MG tablet Take 81 mg by mouth daily.        Marland Kitchen DISCONTD: clopidogrel (PLAVIX) 75 MG tablet Take 75 mg by mouth daily.        Marland Kitchen DISCONTD: valsartan-hydrochlorothiazide (DIOVAN-HCT) 160-25 MG per tablet Take 1 tablet by mouth daily.         Allergies  Allergen Reactions  . Allopurinol   . Sulfonamide Derivatives     Past Medical History  Diagnosis Date  . Coronary atherosclerosis of native coronary artery     NSTEMI 11/10, LCx  stent  . Hypertension   . Chest pain   . Obesity   . Diabetes mellitus     type II  . Gout   . Low back pain     chronic    ROS: Negative except as per HPI  BP 138/78  Pulse 68  Resp 18  Ht 5\' 3"  (1.6 m)  Wt 190 lb (86.183 kg)  BMI 33.66 kg/m2  PHYSICAL EXAM: Pt is alert and oriented, obese woman, in NAD HEENT: normal Neck: JVP - normal, carotids 2+= without bruits Lungs: CTA bilaterally CV: RRR without murmur or gallop Abd: soft, NT, Positive BS, no hepatomegaly Ext: no C/C/E, distal pulses intact and equal Skin: warm/dry no rash  EKG:  Normal sinus rhythm 68 beats per minute, low voltage QRS, otherwise within normal limits.  ASSESSMENT AND PLAN:

## 2010-10-29 NOTE — Assessment & Plan Note (Signed)
The patient's blood pressure is well controlled. Will continue current medical program.

## 2010-11-18 ENCOUNTER — Encounter (HOSPITAL_COMMUNITY)
Admission: RE | Admit: 2010-11-18 | Discharge: 2010-11-18 | Disposition: A | Discharge status: Home / Self Care | Payer: Medicare Other | Source: Ambulatory Visit | Attending: Surgery | Admitting: Surgery

## 2010-11-18 ENCOUNTER — Ambulatory Visit (HOSPITAL_COMMUNITY)
Admission: RE | Admit: 2010-11-18 | Discharge: 2010-11-18 | Disposition: A | Discharge status: Home / Self Care | Payer: Medicare Other | Source: Ambulatory Visit | Attending: Surgery | Admitting: Surgery

## 2010-11-18 ENCOUNTER — Other Ambulatory Visit (HOSPITAL_COMMUNITY): Payer: Self-pay | Admitting: Surgery

## 2010-11-18 DIAGNOSIS — Z01812 Encounter for preprocedural laboratory examination: Secondary | ICD-10-CM | POA: Insufficient documentation

## 2010-11-18 DIAGNOSIS — K8020 Calculus of gallbladder without cholecystitis without obstruction: Secondary | ICD-10-CM | POA: Insufficient documentation

## 2010-11-18 DIAGNOSIS — Z01810 Encounter for preprocedural cardiovascular examination: Secondary | ICD-10-CM | POA: Insufficient documentation

## 2010-11-18 DIAGNOSIS — Z01818 Encounter for other preprocedural examination: Secondary | ICD-10-CM | POA: Insufficient documentation

## 2010-11-18 LAB — CBC
MCH: 30.9 pg (ref 26.0–34.0)
MCHC: 32.3 g/dL (ref 30.0–36.0)
Platelets: 142 10*3/uL — ABNORMAL LOW (ref 150–400)
RDW: 13.9 % (ref 11.5–15.5)

## 2010-11-18 LAB — DIFFERENTIAL
Basophils Absolute: 0 10*3/uL (ref 0.0–0.1)
Basophils Relative: 0 % (ref 0–1)
Eosinophils Absolute: 0.2 10*3/uL (ref 0.0–0.7)
Monocytes Absolute: 0.5 10*3/uL (ref 0.1–1.0)
Monocytes Relative: 8 % (ref 3–12)

## 2010-11-18 LAB — COMPREHENSIVE METABOLIC PANEL
AST: 18 U/L (ref 0–37)
Albumin: 3.6 g/dL (ref 3.5–5.2)
Calcium: 8.9 mg/dL (ref 8.4–10.5)
Creatinine, Ser: 1.31 mg/dL — ABNORMAL HIGH (ref 0.4–1.2)
GFR calc Af Amer: 48 mL/min — ABNORMAL LOW (ref >60)
GFR calc non Af Amer: 39 mL/min — ABNORMAL LOW (ref >60)
Total Protein: 6.3 g/dL (ref 6.0–8.3)

## 2010-11-19 ENCOUNTER — Observation Stay (HOSPITAL_COMMUNITY)
Admission: RE | Admit: 2010-11-19 | Discharge: 2010-11-20 | Disposition: A | Discharge status: Home / Self Care | Payer: Medicare Other | Source: Ambulatory Visit | Attending: Surgery | Admitting: Surgery

## 2010-11-19 ENCOUNTER — Other Ambulatory Visit: Payer: Self-pay | Admitting: Surgery

## 2010-11-19 ENCOUNTER — Encounter: Payer: Self-pay | Admitting: Internal Medicine

## 2010-11-19 DIAGNOSIS — Z9861 Coronary angioplasty status: Secondary | ICD-10-CM | POA: Insufficient documentation

## 2010-11-19 DIAGNOSIS — I2510 Atherosclerotic heart disease of native coronary artery without angina pectoris: Secondary | ICD-10-CM | POA: Insufficient documentation

## 2010-11-19 DIAGNOSIS — E119 Type 2 diabetes mellitus without complications: Secondary | ICD-10-CM | POA: Insufficient documentation

## 2010-11-19 DIAGNOSIS — K8020 Calculus of gallbladder without cholecystitis without obstruction: Principal | ICD-10-CM | POA: Insufficient documentation

## 2010-11-19 DIAGNOSIS — K219 Gastro-esophageal reflux disease without esophagitis: Secondary | ICD-10-CM | POA: Insufficient documentation

## 2010-11-19 LAB — GLUCOSE, CAPILLARY: Glucose-Capillary: 140 mg/dL — ABNORMAL HIGH (ref 70–99)

## 2010-11-26 NOTE — Op Note (Signed)
  [name], [name]                  ACCOUNT NO.:  [id number]  MEDICAL RECORD NO.:  [id number]           PATIENT TYPE:  O  LOCATION:  5118                         FACILITY:  [facility]  PHYSICIAN:  Abigail Miyamoto, M.D. DATE OF BIRTH:  [birth year]  DATE OF PROCEDURE:  11/19/2010 DATE OF DISCHARGE:                              OPERATIVE REPORT   PREOPERATIVE DIAGNOSIS:  Symptomatic cholelithiasis.  POSTOPERATIVE DIAGNOSIS:  Symptomatic cholelithiasis.  PROCEDURE:  Laparoscopic cholecystectomy.  SURGEON:  Abigail Miyamoto, MD  ANESTHESIA:  General and 0.5% Marcaine.  ESTIMATED BLOOD LOSS:  Minimal.  FINDINGS:  The patient was found to have a chronic scarred-appearing gallbladder with gallstones and sludge.  PROCEDURE IN DETAIL:  The patient was brought to the operating room, identified as [name].  She was placed supine on the operating table and general anesthesia was induced.  Her abdomen was then prepped and draped in usual sterile fashion.  Using a #15-blade, a small vertical incision was made above the umbilicus.  This was carried down to fascia, which was then opened with a scalpel.  A hemostat was then used to pass the peritoneal cavity under direct vision.  Next 0-Vicryl purse-string suture was placed around the fascial opening.  The [facility] port was placed through the opening and insufflation of the abdomen was begun.  A 5-mm port was then placed in the patient's epigastrium and two in the right upper quadrant; all under direct vision.  The gallbladder was grasped and retracted above the liver bed.  The cystic duct was easily dissected out and a critical window was achieved around it.  It was clipped three times proximally, once distally, and transected.  The cystic arteries were then identified and clipped three times proximally, once distally, and transected as well.  Gallbladder was then slowly dissected free from the liver bed with the electrocautery.  Once it  was free from the liver bed, it was placed in an endosac and removed through the incision at the umbilicus.  I then thoroughly irrigated the right upper quadrant with normal saline.  Again hemostasis appeared to be achieved.  0-Vicryl at the umbilicus was tied closing the fascial defect.  All incisions were anesthetized with Marcaine and closed with 4- 0 Monocryl subcuticular sutures.  Steri- Strips and Band-Aids were then applied.  The patient tolerated the procedure well.  All counts were correct at the end of the procedure. The patient was then extubated in operating room and taken in a stable condition to the recovery room.     Abigail Miyamoto, M.D.     DB/MEDQ  D:  11/19/2010  T:  11/20/2010  Job:  [id number]  Electronically Signed by Abigail Miyamoto M.D. on 11/26/2010 05:39:26 PM

## 2010-12-05 NOTE — Discharge Summary (Signed)
  [name], [name]                  ACCOUNT NO.:  [id number]  MEDICAL RECORD NO.:  [id number]           PATIENT TYPE:  O  LOCATION:  5118                         FACILITY:  [facility]  PHYSICIAN:  Abigail Miyamoto, M.D. DATE OF BIRTH:  [birth year]  DATE OF ADMISSION:  11/19/2010 DATE OF DISCHARGE:  11/20/2010                              DISCHARGE SUMMARY   DISCHARGE DIAGNOSIS:  Symptomatic cholelithiasis, status post laparoscopic cholecystectomy.  SUMMARY OF HISTORY:  This is a 77 year old female admitted for laparoscopic cholecystectomy.  SUMMARY OF HOSPITAL COURSE:  The patient was admitted and taken to the operating room where she underwent an uneventful laparoscopic cholecystectomy.  She tolerated the procedure well and was taken in a normal condition to regular surgical floor.  On postop day 1, she was doing quite well.  She had no complaints.  Her abdomen was soft.  Her incision was healing well.  Decision was made to discharge the patient to home.  DISCHARGE DIET:  Low fat.  DISCHARGE ACTIVITY:  As tolerated.  She will do no heavy lifting for 2 weeks.  Please see medical reconciliation form for her discharge medications.  DISCHARGE FOLLOWUP:  She will follow up with [facility] in 1-2 weeks postdischarge.     Abigail Miyamoto, M.D.     DB/MEDQ  D:  11/26/2010  T:  11/27/2010  Job:  [id number]  Electronically Signed by Abigail Miyamoto M.D. on 12/05/2010 08:57:24 PM

## 2010-12-24 ENCOUNTER — Encounter: Payer: Self-pay | Admitting: Internal Medicine

## 2011-04-10 ENCOUNTER — Other Ambulatory Visit: Payer: Self-pay | Admitting: Internal Medicine

## 2011-04-21 ENCOUNTER — Ambulatory Visit
Admission: RE | Admit: 2011-04-21 | Discharge: 2011-04-21 | Disposition: A | Discharge status: Home / Self Care | Payer: Medicare Other | Source: Ambulatory Visit | Attending: Internal Medicine | Admitting: Internal Medicine

## 2011-04-21 ENCOUNTER — Other Ambulatory Visit: Payer: Self-pay | Admitting: Internal Medicine

## 2011-04-21 DIAGNOSIS — M25511 Pain in right shoulder: Secondary | ICD-10-CM

## 2011-05-25 ENCOUNTER — Other Ambulatory Visit: Payer: Self-pay | Admitting: Internal Medicine

## 2011-07-08 ENCOUNTER — Other Ambulatory Visit: Payer: Self-pay | Admitting: Cardiovascular Disease

## 2011-10-07 ENCOUNTER — Other Ambulatory Visit: Payer: Self-pay | Admitting: Internal Medicine

## 2011-11-06 ENCOUNTER — Encounter: Payer: Self-pay | Admitting: Cardiovascular Disease

## 2011-11-06 ENCOUNTER — Ambulatory Visit (INDEPENDENT_AMBULATORY_CARE_PROVIDER_SITE_OTHER): Payer: Medicare Other | Admitting: Cardiovascular Disease

## 2011-11-06 VITALS — BP 132/68 | HR 69 | Ht 63.0 in | Wt 195.0 lb

## 2011-11-06 DIAGNOSIS — E785 Hyperlipidemia, unspecified: Secondary | ICD-10-CM

## 2011-11-06 DIAGNOSIS — I2510 Atherosclerotic heart disease of native coronary artery without angina pectoris: Secondary | ICD-10-CM

## 2011-11-06 DIAGNOSIS — I10 Essential (primary) hypertension: Secondary | ICD-10-CM

## 2011-11-06 NOTE — Patient Instructions (Signed)
Your physician wants you to follow-up in: 1 YEAR with Dr Cooper.  You will receive a reminder letter in the mail two months in advance. If you don't receive a letter, please call our office to schedule the follow-up appointment.  Your physician recommends that you continue on your current medications as directed. Please refer to the Current Medication list given to you today.  

## 2011-11-06 NOTE — Assessment & Plan Note (Signed)
Lipids are followed by Dr. Chilton Si. She is on atorvastatin 80 mg. I discussed consideration of the ACCELERATE Trial and she will review the protocol and follow-up with our research staff if interested. This is a randomized controlled trial evaluating the efficacy and safety of HDL-raising therapy with Evacetrapib.

## 2011-11-06 NOTE — Assessment & Plan Note (Signed)
Blood pressure is well controlled on current medical program. 

## 2011-11-06 NOTE — Assessment & Plan Note (Signed)
The patient is stable without anginal symptoms. She is on an appropriate medical program which includes aspirin for antiplatelet therapy, a statin drug, a beta blocker, and an ARB. She will continue on her current medicines and followup in 1 year.

## 2011-11-06 NOTE — Progress Notes (Signed)
   HPI:  78 year old woman presenting for followup evaluation. The patient is followed for coronary artery disease after presenting with non-ST elevation infarction in 2010. At that time, she underwent cardiac catheterization demonstrating critical stenosis of left circumflex and she was treated with a drug-eluting stent. She has had no recurrent ischemic events. The patient had laparoscopic cholecystectomy last year and had no cardiovascular problems around surgery.  She is physically inactive because of limitations related to osteoarthritis. She denies chest pain, chest pressure, dyspnea, edema, or palpitations. She is on a stable program of medications and relates no adverse effects.  Outpatient Encounter Prescriptions as of 11/06/2011  Medication Sig Dispense Refill  . aspirin 81 MG tablet Take 81 mg by mouth daily.      Marland Kitchen atorvastatin (LIPITOR) 80 MG tablet Take 80 mg by mouth daily.        . carvedilol (COREG) 6.25 MG tablet take 1 tablet by mouth twice a day  60 tablet  11  . ferrous sulfate 325 (65 FE) MG tablet Take 325 mg by mouth daily with breakfast.        . furosemide (LASIX) 40 MG tablet Take 40 mg by mouth daily. 1/2 tablet daily      . pantoprazole (PROTONIX) 40 MG tablet take 1 tablet by mouth once daily  30 tablet  5  . probenecid (BENEMID) 500 MG tablet Take 500 mg by mouth 2 (two) times daily.       . valsartan-hydrochlorothiazide (DIOVAN-HCT) 160-25 MG per tablet Take 1 tablet by mouth daily.          Allergies  Allergen Reactions  . Allopurinol   . Sulfonamide Derivatives     Past Medical History  Diagnosis Date  . Coronary atherosclerosis of native coronary artery     NSTEMI 11/10, LCx stent  . Hypertension   . Chest pain   . Obesity   . Diabetes mellitus     type II  . Gout   . Low back pain     chronic    ROS: Negative except as per HPI  BP 132/68  Pulse 69  Ht 5\' 3"  (1.6 m)  Wt 88.451 kg (195 lb)  BMI 34.54 kg/m2  PHYSICAL EXAM: Pt is alert and  oriented, obese woman in NAD HEENT: normal Neck: JVP - normal, carotids 2+= without bruits Lungs: CTA bilaterally CV: RRR without murmur or gallop Abd: soft, NT, Positive BS, no hepatomegaly Ext: no C/C/E, distal pulses intact and equal Skin: warm/dry no rash  EKG:  Normal sinus rhythm 69 beats per minute, low voltage QRS, cannot rule out age-indeterminate anterior infarct.  ASSESSMENT AND PLAN:

## 2012-05-09 IMAGING — CR DG CHEST 1V PORT
1 series · 1 of 1 positions shown · non-contrast
Comparison: 06/05/2009

CLINICAL DATA: Chest pain, back pain

PORTABLE CHEST - 1 VIEW

[AP]
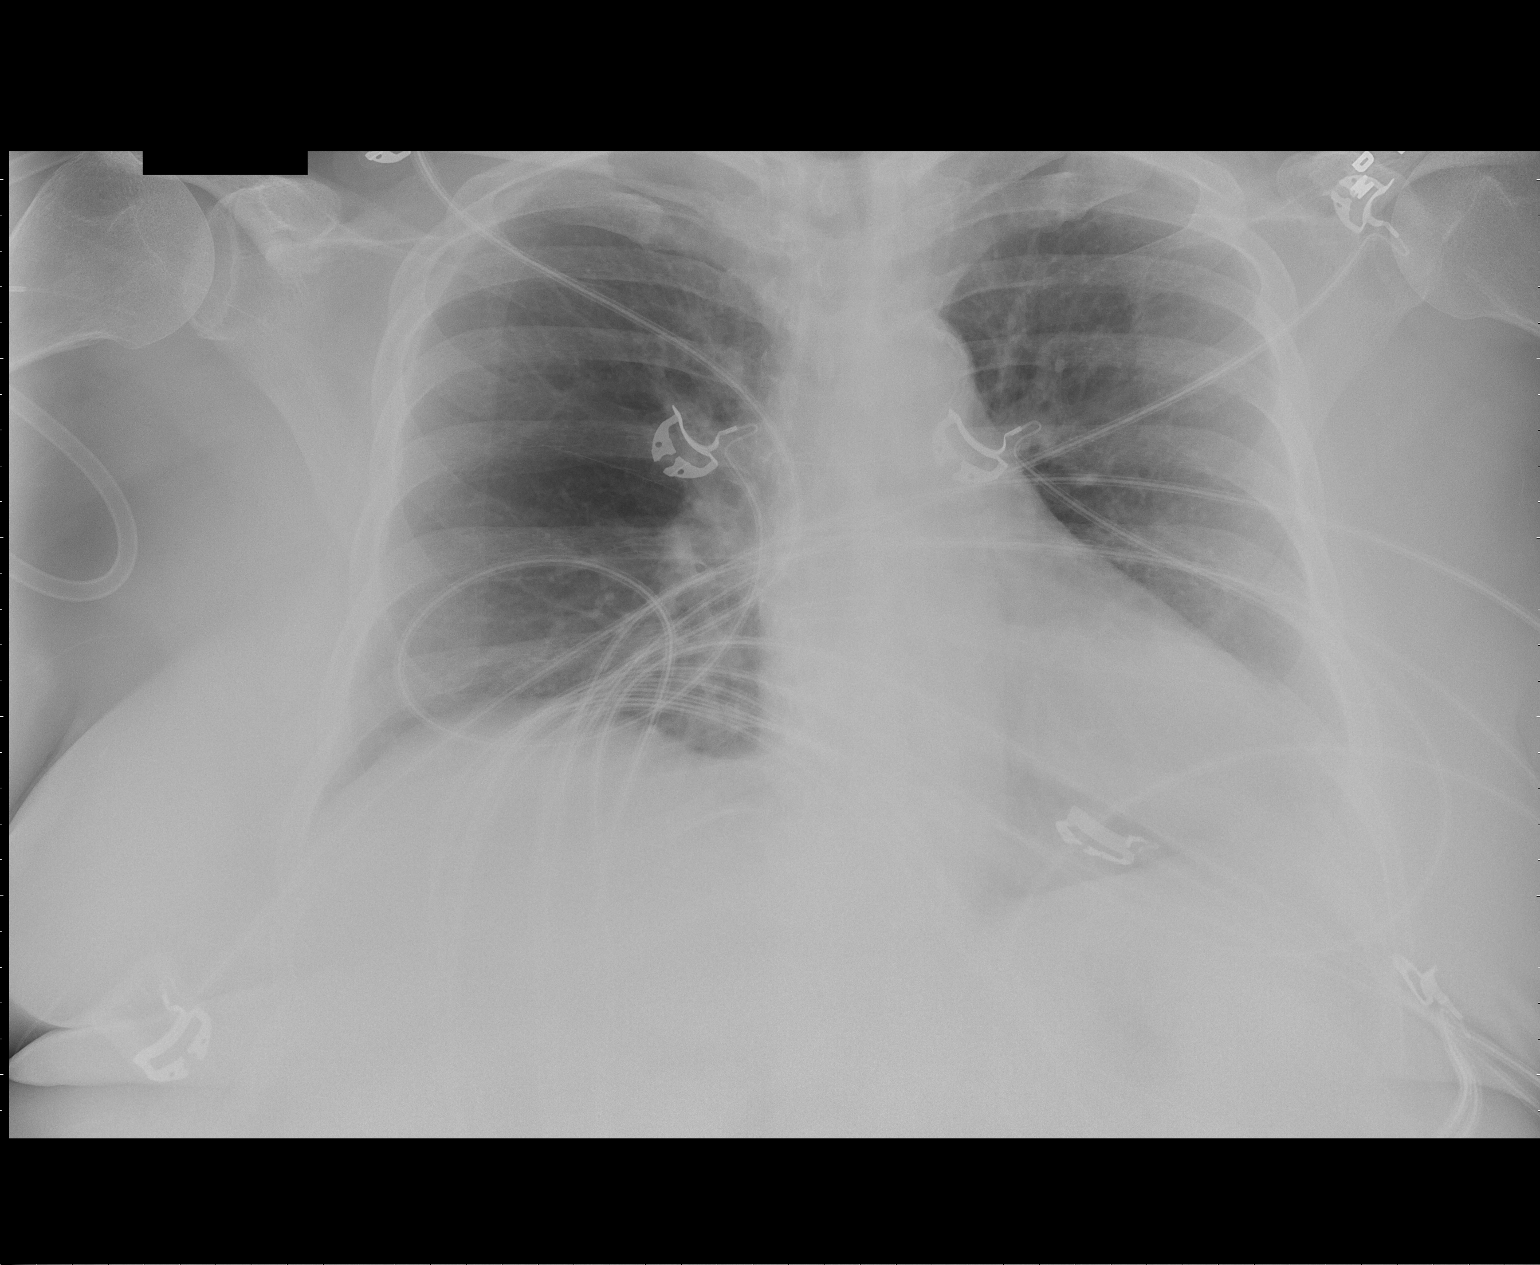

[1 of 1 positions shown; findings below may reference images not displayed]

FINDINGS: The lungs are low volume but clear.  No confluent
airspace opacities, edema or effusions are seen.  The heart is at
the upper limits of normal in size although, accentuated by the low
lung volumes.  The upper abdomen and osseous structures are normal.
IMPRESSION: Low lung volumes without acute findings.

## 2012-07-04 ENCOUNTER — Other Ambulatory Visit: Payer: Self-pay | Admitting: *Deleted

## 2012-07-04 ENCOUNTER — Other Ambulatory Visit: Payer: Self-pay | Admitting: Cardiovascular Disease

## 2012-11-08 ENCOUNTER — Encounter: Payer: Self-pay | Admitting: Cardiovascular Disease

## 2012-11-08 ENCOUNTER — Ambulatory Visit (INDEPENDENT_AMBULATORY_CARE_PROVIDER_SITE_OTHER): Payer: Medicare Other | Admitting: Cardiovascular Disease

## 2012-11-08 VITALS — BP 132/78 | HR 76 | Ht 63.0 in | Wt 193.4 lb

## 2012-11-08 DIAGNOSIS — I2510 Atherosclerotic heart disease of native coronary artery without angina pectoris: Secondary | ICD-10-CM

## 2012-11-08 NOTE — Patient Instructions (Addendum)
Your physician wants you to follow-up in: 1 YEAR with Dr Cooper.  You will receive a reminder letter in the mail two months in advance. If you don't receive a letter, please call our office to schedule the follow-up appointment.  Your physician recommends that you continue on your current medications as directed. Please refer to the Current Medication list given to you today.  

## 2012-11-08 NOTE — Progress Notes (Signed)
   HPI:  79 year old woman presenting for followup evaluation. The patient is followed for coronary artery disease after presenting with non-ST elevation infarction in 2010. At that time, she underwent cardiac catheterization demonstrating critical stenosis of left circumflex and she was treated with a drug-eluting stent.  She is doing well from a cardiac perspective. No CP, dyspnea, edema, or palpitations. She complains of fatigue. Limited by hip, knee, and hand arthritis. Not very active.  Outpatient Encounter Prescriptions as of 11/08/2012  Medication Sig Dispense Refill  . aspirin 81 MG tablet Take 81 mg by mouth daily.      Marland Kitchen atorvastatin (LIPITOR) 80 MG tablet Take 80 mg by mouth daily.        . carvedilol (COREG) 6.25 MG tablet take 1 tablet by mouth twice a day  60 tablet  11  . ferrous sulfate 325 (65 FE) MG tablet Take 325 mg by mouth daily with breakfast.        . furosemide (LASIX) 40 MG tablet Take 40 mg by mouth daily. 1/2 tablet daily      . pantoprazole (PROTONIX) 40 MG tablet take 1 tablet by mouth once daily  30 tablet  5  . probenecid (BENEMID) 500 MG tablet Take 500 mg by mouth 2 (two) times daily.       . valsartan-hydrochlorothiazide (DIOVAN-HCT) 160-12.5 MG per tablet Take 1 tablet by mouth daily.      . [DISCONTINUED] valsartan-hydrochlorothiazide (DIOVAN-HCT) 160-25 MG per tablet Take 1 tablet by mouth daily.         No facility-administered encounter medications on file as of 11/08/2012.    Allergies  Allergen Reactions  . Allopurinol   . Sulfonamide Derivatives     Past Medical History  Diagnosis Date  . Coronary atherosclerosis of native coronary artery     NSTEMI 11/10, LCx stent  . Hypertension   . Chest pain   . Obesity   . Diabetes mellitus     type II  . Gout   . Low back pain     chronic    ROS: Negative except as per HPI  BP 132/78  Pulse 76  Ht 5\' 3"  (1.6 m)  Wt 87.726 kg (193 lb 6.4 oz)  BMI 34.27 kg/m2  PHYSICAL EXAM: Pt is alert  and oriented, pleasant, obese, elderly woman in NAD HEENT: normal Neck: JVP - normal, carotids 2+= without bruits Lungs: CTA bilaterally CV: RRR without murmur or gallop Abd: soft, NT, Positive BS, no hepatomegaly Ext: no C/C/E, distal pulses intact and equal Skin: warm/dry no rash  EKG:  NSR 76 bpm, low valtage QRS, leftward axis  ASSESSMENT AND PLAN: 1. CAD, native vessel with hx of NSTEMI. LVEF has been preserved at 50-55% last echo 2011. No symptoms of heart failure. Continue current med Rx. Meds reviewed and appropriate.  2. HTN - noted BP was elevated recently when she saw Dr Chilton Si, but OK today. Will continue same program.  3. Hyperlipidemia - followed by Dr Chilton Si, pt on high-intensity statin drug.  Tonny Bollman 11/08/2012 2:01 PM

## 2013-12-06 ENCOUNTER — Ambulatory Visit: Payer: Medicare Other | Admitting: Cardiovascular Disease

## 2013-12-07 ENCOUNTER — Ambulatory Visit: Payer: Medicare Other | Admitting: Cardiovascular Disease

## 2013-12-12 ENCOUNTER — Encounter: Payer: Self-pay | Admitting: Cardiovascular Disease

## 2013-12-12 ENCOUNTER — Ambulatory Visit (INDEPENDENT_AMBULATORY_CARE_PROVIDER_SITE_OTHER): Payer: Medicare Other | Admitting: Cardiovascular Disease

## 2013-12-12 VITALS — BP 138/70 | HR 68 | Ht 63.0 in | Wt 203.1 lb

## 2013-12-12 DIAGNOSIS — I10 Essential (primary) hypertension: Secondary | ICD-10-CM

## 2013-12-12 DIAGNOSIS — I2510 Atherosclerotic heart disease of native coronary artery without angina pectoris: Secondary | ICD-10-CM

## 2013-12-12 NOTE — Progress Notes (Signed)
    HPI:  80 year old woman presenting for followup evaluation. The patient is followed for coronary artery disease after presenting with non-ST elevation infarction in 2010. At that time, she underwent cardiac catheterization demonstrating critical stenosis of left circumflex and she was treated with a drug-eluting stent.  The patient continues to do well from a cardiac perspective. She is physically limited from problems related to arthritis. She has not experienced any chest pain, shortness of breath, leg swelling, or palpitations since I have last seen her. She is compliant with her medications. She follows closely with Dr. Chilton Si.   Outpatient Encounter Prescriptions as of 12/12/2013  Medication Sig  . aspirin 81 MG tablet Take 81 mg by mouth daily.  Marland Kitchen atorvastatin (LIPITOR) 80 MG tablet Take 80 mg by mouth daily.    . carvedilol (COREG) 6.25 MG tablet take 1 tablet by mouth twice a day  . ferrous sulfate 325 (65 FE) MG tablet Take 325 mg by mouth daily with breakfast.    . furosemide (LASIX) 40 MG tablet Take 40 mg by mouth daily. 1/2 tablet daily  . pantoprazole (PROTONIX) 40 MG tablet take 1 tablet by mouth once daily  . probenecid (BENEMID) 500 MG tablet Take 500 mg by mouth 2 (two) times daily.   . valsartan-hydrochlorothiazide (DIOVAN-HCT) 160-12.5 MG per tablet Take 1 tablet by mouth daily.    Allergies  Allergen Reactions  . Allopurinol   . Sulfonamide Derivatives     Past Medical History  Diagnosis Date  . Coronary atherosclerosis of native coronary artery     NSTEMI 11/10, LCx stent  . Hypertension   . Chest pain   . Obesity   . Diabetes mellitus     type II  . Gout   . Low back pain     chronic    ROS: Negative except as per HPI  BP 138/70  Pulse 68  Ht 5\' 3"  (1.6 m)  Wt 92.135 kg (203 lb 1.9 oz)  BMI 35.99 kg/m2  PHYSICAL EXAM: Pt is alert and oriented, pleasant elderly woman in NAD HEENT: normal Neck: JVP - normal, carotids 2+= without bruits Lungs:  CTA bilaterally CV: RRR without murmur or gallop Abd: soft, NT Ext: Trace pretibial edema bilaterally, distal pulses intact and equal Skin: warm/dry no rash  EKG:  Normal sinus rhythm 68 beats per minute, low-voltage QRS, otherwise within normal limits.  ASSESSMENT AND PLAN: 1. CAD, native vessel. The patient is stable without symptoms of angina. She is on appropriate medical therapy with aspirin, a high intensity statin drug, a beta blocker, and an ARB. I will see her back in one year.  2. Hypertension. Blood pressure is well controlled on valsartan/hydrochlorothiazide and carvedilol.  #3. Hyperlipidemia. Treated with high-dose atorvastatin. Lipids followed by Dr. Chilton Si.  For followup I will see her back in one year.  Tonny Bollman 12/12/2013 11:19 AM

## 2013-12-12 NOTE — Patient Instructions (Signed)
Your physician wants you to follow-up in: 1 YEAR with Dr Cooper.  You will receive a reminder letter in the mail two months in advance. If you don't receive a letter, please call our office to schedule the follow-up appointment.  Your physician recommends that you continue on your current medications as directed. Please refer to the Current Medication list given to you today.  

## 2014-07-04 ENCOUNTER — Encounter (HOSPITAL_COMMUNITY): Payer: Self-pay

## 2014-07-04 ENCOUNTER — Emergency Department (HOSPITAL_COMMUNITY)
Admission: EM | Admit: 2014-07-04 | Discharge: 2014-07-04 | Disposition: A | Discharge status: Home / Self Care | Payer: Medicare Other | Source: Home / Self Care | Attending: Emergency Medicine | Admitting: Emergency Medicine

## 2014-07-04 DIAGNOSIS — J069 Acute upper respiratory infection, unspecified: Secondary | ICD-10-CM

## 2014-07-04 MED ORDER — BENZONATATE 200 MG PO CAPS: 200.0000 mg | ORAL_CAPSULE | Freq: Three times a day (TID) | ORAL | Status: DC | PRN

## 2014-07-04 MED ORDER — PREDNISONE 20 MG PO TABS: 20.0000 mg | ORAL_TABLET | Freq: Two times a day (BID) | ORAL | Status: DC

## 2014-07-04 MED ORDER — ALBUTEROL SULFATE HFA 108 (90 BASE) MCG/ACT IN AERS
2.0000 | INHALATION_SPRAY | Freq: Four times a day (QID) | RESPIRATORY_TRACT | Status: DC
Start: 2014-07-04 — End: 2016-06-15

## 2014-07-04 MED ORDER — IPRATROPIUM BROMIDE 0.06 % NA SOLN: 2.0000 | Freq: Four times a day (QID) | NASAL | Status: DC

## 2014-07-04 NOTE — ED Notes (Signed)
Sunday AM woke w cough , hoarse, congested

## 2014-07-04 NOTE — Discharge Instructions (Signed)

## 2014-07-04 NOTE — ED Provider Notes (Signed)
   Chief Complaint   Cough   History of Present Illness   [name] is a 80 year old female who has had a four-day history of sore throat, hoarseness, cough productive green sputum, and nasal congestion with pink, watery drainage. She denies fever, chills, headache, ear congestion, difficulty breathing, chest pain, or GI symptoms.  Review of Systems   Other than as noted above, the patient denies any of the following symptoms: Systemic:  No fevers, chills, sweats, or myalgias. Eye:  No redness or discharge. ENT:  No ear pain, headache, nasal congestion, drainage, sinus pressure, or sore throat. Neck:  No neck pain, stiffness, or swollen glands. Lungs:  No cough, sputum production, hemoptysis, wheezing, chest tightness, shortness of breath or chest pain. GI:  No abdominal pain, nausea, vomiting or diarrhea.  PMFSH   Past medical history, family history, social history, meds, and allergies were reviewed. She is allergic to allopurinol and sulfa. She's had a history of ischemic heart disease and MI in the past, hyperlipidemia, hypertension, gout, and GERD. Current meds include aspirin, Lipitor, carvedilol, ferrous sulfate, furosemide, Protonix, probenecid, and Diovan/HCTZ.  Physical exam   Vital signs:  BP 135/71 mmHg  Pulse 73  Temp(Src) 97.8 F (36.6 C) (Oral)  SpO2 95% General:  Alert and oriented.  In no distress.  Skin warm and dry. Eye:  No conjunctival injection or drainage. Lids were normal. ENT:  TMs and canals were normal, without erythema or inflammation.  Nasal mucosa was clear and uncongested, without drainage.  Mucous membranes were moist.  Pharynx was clear with no exudate or drainage.  There were no oral ulcerations or lesions. Neck:  Supple, no adenopathy, tenderness or mass. Lungs:  No respiratory distress.  Lungs were clear to auscultation, without wheezes, rales or rhonchi.  Breath sounds were clear and equal bilaterally.  Heart:  Regular rhythm, without  gallops, murmers or rubs. Skin:  Clear, warm, and dry, without rash or lesions.  Assessment     The encounter diagnosis was Viral URI.  There is no evidence of pneumonia, strep throat, sinusitis, otitis media.    Plan    1.  Meds:  The following meds were prescribed:   Discharge Medication List as of 07/04/2014  9:56 AM    START taking these medications   Details  albuterol (PROVENTIL HFA;VENTOLIN HFA) 108 (90 BASE) MCG/ACT inhaler Inhale 2 puffs into the lungs 4 (four) times daily., Starting 07/04/2014, Until Discontinued, Normal    benzonatate (TESSALON) 200 MG capsule Take 1 capsule (200 mg total) by mouth 3 (three) times daily as needed for cough., Starting 07/04/2014, Until Discontinued, Normal    ipratropium (ATROVENT) 0.06 % nasal spray Place 2 sprays into both nostrils 4 (four) times daily., Starting 07/04/2014, Until Discontinued, Normal    predniSONE (DELTASONE) 20 MG tablet Take 1 tablet (20 mg total) by mouth 2 (two) times daily., Starting 07/04/2014, Until Discontinued, Normal        2.  Patient Education/Counseling:  The patient was given appropriate handouts, self care instructions, and instructed in symptomatic relief.  Instructed to get extra fluids and extra rest.    3.  Follow up:  The patient was told to follow up here if no better in 3 to 4 days, or sooner if becoming worse in any way, and given some red flag symptoms such as increasing fever, difficulty breathing, chest pain, or persistent vomiting which would prompt immediate return.       Reuben Likesavid C Algie Westry, MD 07/04/14 [phone]

## 2014-08-17 ENCOUNTER — Encounter (HOSPITAL_COMMUNITY): Payer: Self-pay

## 2014-08-17 ENCOUNTER — Emergency Department (HOSPITAL_COMMUNITY): Payer: Medicare Other

## 2014-08-17 ENCOUNTER — Inpatient Hospital Stay (HOSPITAL_COMMUNITY)
Admission: EM | Admit: 2014-08-17 | Discharge: 2014-08-21 | DRG: 372 | Disposition: A | Discharge status: Home / Self Care | Payer: Medicare Other | Attending: Internal Medicine | Admitting: Internal Medicine

## 2014-08-17 ENCOUNTER — Emergency Department (HOSPITAL_COMMUNITY)
Admission: EM | Admit: 2014-08-17 | Discharge: 2014-08-17 | Disposition: A | Discharge status: Other Acute Inpatient Hospital | Payer: Medicare Other | Source: Home / Self Care | Attending: Emergency Medicine | Admitting: Emergency Medicine

## 2014-08-17 DIAGNOSIS — E1165 Type 2 diabetes mellitus with hyperglycemia: Secondary | ICD-10-CM | POA: Diagnosis present

## 2014-08-17 DIAGNOSIS — D696 Thrombocytopenia, unspecified: Secondary | ICD-10-CM | POA: Diagnosis present

## 2014-08-17 DIAGNOSIS — M109 Gout, unspecified: Secondary | ICD-10-CM | POA: Diagnosis present

## 2014-08-17 DIAGNOSIS — Z7982 Long term (current) use of aspirin: Secondary | ICD-10-CM

## 2014-08-17 DIAGNOSIS — N179 Acute kidney failure, unspecified: Secondary | ICD-10-CM | POA: Diagnosis present

## 2014-08-17 DIAGNOSIS — I252 Old myocardial infarction: Secondary | ICD-10-CM | POA: Diagnosis not present

## 2014-08-17 DIAGNOSIS — D649 Anemia, unspecified: Secondary | ICD-10-CM | POA: Diagnosis present

## 2014-08-17 DIAGNOSIS — I2510 Atherosclerotic heart disease of native coronary artery without angina pectoris: Secondary | ICD-10-CM | POA: Diagnosis present

## 2014-08-17 DIAGNOSIS — I129 Hypertensive chronic kidney disease with stage 1 through stage 4 chronic kidney disease, or unspecified chronic kidney disease: Secondary | ICD-10-CM | POA: Diagnosis present

## 2014-08-17 DIAGNOSIS — E860 Dehydration: Secondary | ICD-10-CM | POA: Diagnosis present

## 2014-08-17 DIAGNOSIS — R197 Diarrhea, unspecified: Secondary | ICD-10-CM

## 2014-08-17 DIAGNOSIS — A047 Enterocolitis due to Clostridium difficile: Secondary | ICD-10-CM | POA: Diagnosis present

## 2014-08-17 DIAGNOSIS — Z6835 Body mass index (BMI) 35.0-35.9, adult: Secondary | ICD-10-CM | POA: Diagnosis not present

## 2014-08-17 DIAGNOSIS — Z7952 Long term (current) use of systemic steroids: Secondary | ICD-10-CM | POA: Diagnosis not present

## 2014-08-17 DIAGNOSIS — Z955 Presence of coronary angioplasty implant and graft: Secondary | ICD-10-CM

## 2014-08-17 DIAGNOSIS — E669 Obesity, unspecified: Secondary | ICD-10-CM | POA: Diagnosis present

## 2014-08-17 DIAGNOSIS — I959 Hypotension, unspecified: Secondary | ICD-10-CM

## 2014-08-17 DIAGNOSIS — I10 Essential (primary) hypertension: Secondary | ICD-10-CM | POA: Diagnosis present

## 2014-08-17 DIAGNOSIS — N189 Chronic kidney disease, unspecified: Secondary | ICD-10-CM | POA: Diagnosis present

## 2014-08-17 DIAGNOSIS — A0472 Enterocolitis due to Clostridium difficile, not specified as recurrent: Secondary | ICD-10-CM

## 2014-08-17 LAB — CBC WITH DIFFERENTIAL/PLATELET
BASOS PCT: 0 % (ref 0–1)
Basophils Absolute: 0 10*3/uL (ref 0.0–0.1)
Basophils Absolute: 0 10*3/uL (ref 0.0–0.1)
Basophils Relative: 0 % (ref 0–1)
EOS ABS: 0 10*3/uL (ref 0.0–0.7)
EOS ABS: 0 10*3/uL (ref 0.0–0.7)
EOS PCT: 0 % (ref 0–5)
Eosinophils Relative: 0 % (ref 0–5)
HCT: 33.3 % — ABNORMAL LOW (ref 36.0–46.0)
HCT: 32.6 % — ABNORMAL LOW (ref 36.0–46.0)
HEMOGLOBIN: 10.3 g/dL — AB (ref 12.0–15.0)
Hemoglobin: 10.6 g/dL — ABNORMAL LOW (ref 12.0–15.0)
LYMPHS PCT: 6 % — AB (ref 12–46)
Lymphocytes Relative: 6 % — ABNORMAL LOW (ref 12–46)
Lymphs Abs: 0.6 10*3/uL — ABNORMAL LOW (ref 0.7–4.0)
Lymphs Abs: 0.4 10*3/uL — ABNORMAL LOW (ref 0.7–4.0)
MCH: 31.8 pg (ref 26.0–34.0)
MCH: 32 pg (ref 26.0–34.0)
MCHC: 31.8 g/dL (ref 30.0–36.0)
MCHC: 31.6 g/dL (ref 30.0–36.0)
MCV: 100 fL (ref 78.0–100.0)
MCV: 101.2 fL — ABNORMAL HIGH (ref 78.0–100.0)
MONO ABS: 0.4 10*3/uL (ref 0.1–1.0)
MONO ABS: 0.2 10*3/uL (ref 0.1–1.0)
MONOS PCT: 2 % — AB (ref 3–12)
Monocytes Relative: 4 % (ref 3–12)
NEUTROS ABS: 8.8 10*3/uL — AB (ref 1.7–7.7)
NEUTROS ABS: 6.9 10*3/uL (ref 1.7–7.7)
NEUTROS PCT: 92 % — AB (ref 43–77)
Neutrophils Relative %: 90 % — ABNORMAL HIGH (ref 43–77)
PLATELETS: 116 10*3/uL — AB (ref 150–400)
Platelets: 101 10*3/uL — ABNORMAL LOW (ref 150–400)
RBC: 3.33 MIL/uL — ABNORMAL LOW (ref 3.87–5.11)
RBC: 3.22 MIL/uL — ABNORMAL LOW (ref 3.87–5.11)
RDW: 13.9 % (ref 11.5–15.5)
RDW: 14 % (ref 11.5–15.5)
WBC: 9.8 10*3/uL (ref 4.0–10.5)
WBC: 7.5 10*3/uL (ref 4.0–10.5)

## 2014-08-17 LAB — COMPREHENSIVE METABOLIC PANEL
ALBUMIN: 2.7 g/dL — AB (ref 3.5–5.2)
ALK PHOS: 90 U/L (ref 39–117)
ALT: 15 U/L (ref 0–35)
AST: 13 U/L (ref 0–37)
Anion gap: 11 (ref 5–15)
BUN: 37 mg/dL — ABNORMAL HIGH (ref 6–23)
CHLORIDE: 103 mmol/L (ref 96–112)
CO2: 26 mmol/L (ref 19–32)
Calcium: 6.6 mg/dL — ABNORMAL LOW (ref 8.4–10.5)
Creatinine, Ser: 1.83 mg/dL — ABNORMAL HIGH (ref 0.50–1.10)
GFR calc Af Amer: 29 mL/min — ABNORMAL LOW (ref >90)
GFR calc non Af Amer: 25 mL/min — ABNORMAL LOW (ref >90)
GLUCOSE: 303 mg/dL — AB (ref 70–99)
Potassium: 4.5 mmol/L (ref 3.5–5.1)
SODIUM: 140 mmol/L (ref 135–145)
Total Bilirubin: 0.4 mg/dL (ref 0.3–1.2)
Total Protein: 5.9 g/dL — ABNORMAL LOW (ref 6.0–8.3)

## 2014-08-17 LAB — GLUCOSE, CAPILLARY: Glucose-Capillary: 273 mg/dL — ABNORMAL HIGH (ref 70–99)

## 2014-08-17 LAB — I-STAT CG4 LACTIC ACID, ED: Lactic Acid, Venous: 1.03 mmol/L (ref 0.5–2.0)

## 2014-08-17 MED ORDER — ONDANSETRON HCL 4 MG PO TABS: 4.0000 mg | ORAL_TABLET | Freq: Four times a day (QID) | ORAL | Status: DC | PRN

## 2014-08-17 MED ORDER — INSULIN ASPART 100 UNIT/ML ~~LOC~~ SOLN
0.0000 [IU] | Freq: Three times a day (TID) | SUBCUTANEOUS | Status: DC
  Administered 2014-08-18: 5 [IU] via SUBCUTANEOUS
  Administered 2014-08-18: 2 [IU] via SUBCUTANEOUS
  Administered 2014-08-18: 7 [IU] via SUBCUTANEOUS
  Administered 2014-08-19: 1 [IU] via SUBCUTANEOUS
  Administered 2014-08-19: 2 [IU] via SUBCUTANEOUS
  Administered 2014-08-19: 1 [IU] via SUBCUTANEOUS
  Administered 2014-08-20 – 2014-08-21 (×3): 2 [IU] via SUBCUTANEOUS

## 2014-08-17 MED ORDER — SODIUM CHLORIDE 0.9 % IV SOLN: INTRAVENOUS | Status: DC

## 2014-08-17 MED ORDER — HYDROCODONE-ACETAMINOPHEN 5-325 MG PO TABS: 1.0000 | ORAL_TABLET | ORAL | Status: DC | PRN

## 2014-08-17 MED ORDER — VANCOMYCIN 50 MG/ML ORAL SOLUTION
250.0000 mg | Freq: Four times a day (QID) | ORAL | Status: DC
  Administered 2014-08-17 – 2014-08-20 (×11): 250 mg via ORAL
  Filled 2014-08-17 (×17): qty 5

## 2014-08-17 MED ORDER — BENZONATATE 100 MG PO CAPS
200.0000 mg | ORAL_CAPSULE | Freq: Three times a day (TID) | ORAL | Status: DC | PRN
  Filled 2014-08-17: qty 2

## 2014-08-17 MED ORDER — ONDANSETRON HCL 4 MG/2ML IJ SOLN: 4.0000 mg | Freq: Four times a day (QID) | INTRAMUSCULAR | Status: DC | PRN

## 2014-08-17 MED ORDER — ATORVASTATIN CALCIUM 80 MG PO TABS
80.0000 mg | ORAL_TABLET | Freq: Every day | ORAL | Status: DC
  Administered 2014-08-17 – 2014-08-21 (×5): 80 mg via ORAL
  Filled 2014-08-17 (×5): qty 1

## 2014-08-17 MED ORDER — CARVEDILOL 6.25 MG PO TABS
6.2500 mg | ORAL_TABLET | Freq: Two times a day (BID) | ORAL | Status: DC
  Administered 2014-08-18 – 2014-08-20 (×5): 6.25 mg via ORAL
  Filled 2014-08-17 (×6): qty 1

## 2014-08-17 MED ORDER — SODIUM CHLORIDE 0.9 % IV SOLN
INTRAVENOUS | Status: DC
  Administered 2014-08-17 – 2014-08-18 (×3): via INTRAVENOUS

## 2014-08-17 MED ORDER — ALUM & MAG HYDROXIDE-SIMETH 200-200-20 MG/5ML PO SUSP: 30.0000 mL | Freq: Four times a day (QID) | ORAL | Status: DC | PRN

## 2014-08-17 MED ORDER — GUAIFENESIN-DM 100-10 MG/5ML PO SYRP
5.0000 mL | ORAL_SOLUTION | ORAL | Status: DC | PRN
  Filled 2014-08-17: qty 5

## 2014-08-17 MED ORDER — ASPIRIN 81 MG PO CHEW
81.0000 mg | CHEWABLE_TABLET | Freq: Every day | ORAL | Status: DC
Start: 2014-08-17 — End: 2014-08-21
  Administered 2014-08-18 – 2014-08-21 (×4): 81 mg via ORAL
  Filled 2014-08-17 (×5): qty 1

## 2014-08-17 MED ORDER — SODIUM CHLORIDE 0.9 % IV SOLN
1.0000 g | Freq: Once | INTRAVENOUS | Status: AC
  Administered 2014-08-17: 1 g via INTRAVENOUS
  Filled 2014-08-17: qty 10

## 2014-08-17 MED ORDER — HEPARIN SODIUM (PORCINE) 5000 UNIT/ML IJ SOLN
5000.0000 [IU] | Freq: Three times a day (TID) | INTRAMUSCULAR | Status: DC
  Administered 2014-08-17 – 2014-08-21 (×11): 5000 [IU] via SUBCUTANEOUS
  Filled 2014-08-17 (×14): qty 1

## 2014-08-17 MED ORDER — SODIUM CHLORIDE 0.9 % IV SOLN
Freq: Once | INTRAVENOUS | Status: AC
  Administered 2014-08-17: 13:00:00 via INTRAVENOUS

## 2014-08-17 MED ORDER — SODIUM CHLORIDE 0.9 % IV BOLUS (SEPSIS): 1000.0000 mL | Freq: Once | INTRAVENOUS | Status: DC

## 2014-08-17 MED ORDER — PREDNISONE 20 MG PO TABS
20.0000 mg | ORAL_TABLET | Freq: Every day | ORAL | Status: DC
  Administered 2014-08-18: 20 mg via ORAL
  Filled 2014-08-17 (×2): qty 1

## 2014-08-17 MED ORDER — INSULIN ASPART 100 UNIT/ML ~~LOC~~ SOLN
0.0000 [IU] | Freq: Every day | SUBCUTANEOUS | Status: DC
  Administered 2014-08-17: 3 [IU] via SUBCUTANEOUS
  Administered 2014-08-18: 2 [IU] via SUBCUTANEOUS

## 2014-08-17 NOTE — ED Provider Notes (Signed)
CSN: [id number]     Arrival date & time 08/17/14  1044 History   First MD Initiated Contact with Patient 08/17/14 1119     Chief Complaint  Patient presents with  . Diarrhea   (Consider location/radiation/quality/duration/timing/severity/associated sxs/prior Treatment) HPI  She is an 81 year old woman here for evaluation of diarrhea. She states she has recently received 2 antibiotics for bronchitis. She thinks one of them was amoxicillin and the second one started with a "c."  She developed watery diarrhea during the second course of antibiotics. She finished antibiotics 3 days ago, but the diarrhea has persisted. She describes 6-8 watery stools a day. No nausea or vomiting. She does have crampy abdominal pain, worse in the left lower quadrant. She reports seeing some blood in the stool, states it drips after the bowel movement. She does have a history of hemorrhoids. No fevers or chills.  She does report getting a little dizzy sometimes when she stands up.  Past Medical History  Diagnosis Date  . Coronary atherosclerosis of native coronary artery     NSTEMI 11/10, LCx stent  . Hypertension   . Chest pain   . Obesity   . Diabetes mellitus     type II  . Gout   . Low back pain     chronic   Past Surgical History  Procedure Laterality Date  . Appendectomy    . Coronary stent placement     Family History  Problem Relation Age of Onset  . Heart attack Father 58    deceased  . Stroke Mother 35    deceased  . Other      no CAD in siblings (3 brothers and 1 sister)   History  Substance Use Topics  . Smoking status: Never Smoker   . Smokeless tobacco: Not on file  . Alcohol Use: No   OB History    No data available     Review of Systems  Constitutional: Negative for fever and chills.  Respiratory: Negative for cough and shortness of breath.   Cardiovascular: Negative for chest pain.  Gastrointestinal: Positive for abdominal pain and diarrhea. Negative for nausea and  vomiting.  Neurological: Positive for dizziness.    Allergies  Allopurinol and Sulfonamide derivatives  Home Medications   Prior to Admission medications   Medication Sig Start Date End Date Taking? Authorizing Provider  albuterol (PROVENTIL HFA;VENTOLIN HFA) 108 (90 BASE) MCG/ACT inhaler Inhale 2 puffs into the lungs 4 (four) times daily. 07/04/14   Reuben Likes, MD  aspirin 81 MG tablet Take 81 mg by mouth daily.    Historical Provider, MD  atorvastatin (LIPITOR) 80 MG tablet Take 80 mg by mouth daily.      Historical Provider, MD  benzonatate (TESSALON) 200 MG capsule Take 1 capsule (200 mg total) by mouth 3 (three) times daily as needed for cough. 07/04/14   Reuben Likes, MD  carvedilol (COREG) 6.25 MG tablet take 1 tablet by mouth twice a day 07/04/12   Micheline Chapman, MD  ferrous sulfate 325 (65 FE) MG tablet Take 325 mg by mouth daily with breakfast.      Historical Provider, MD  furosemide (LASIX) 40 MG tablet Take 40 mg by mouth daily. 1/2 tablet daily 10/07/11   Historical Provider, MD  ipratropium (ATROVENT) 0.06 % nasal spray Place 2 sprays into both nostrils 4 (four) times daily. 07/04/14   Reuben Likes, MD  pantoprazole (PROTONIX) 40 MG tablet take 1 tablet by mouth once  daily 10/07/11   Micheline ChapmanMichael D Cooper, MD  predniSONE (DELTASONE) 20 MG tablet Take 1 tablet (20 mg total) by mouth 2 (two) times daily. 07/04/14   Reuben Likesavid C Keller, MD  probenecid (BENEMID) 500 MG tablet Take 500 mg by mouth 2 (two) times daily.     Historical Provider, MD  valsartan-hydrochlorothiazide (DIOVAN-HCT) 160-12.5 MG per tablet Take 1 tablet by mouth daily.    Historical Provider, MD   BP 101/67 mmHg  Pulse 80  Temp(Src) 97.5 F (36.4 C) (Oral)  Resp 22  SpO2 98% Physical Exam  Constitutional: She is oriented to person, place, and time. She appears well-developed and well-nourished. No distress.  Neck: Neck supple.  Cardiovascular: Normal rate, regular rhythm and normal heart sounds.   No  murmur heard. Pulmonary/Chest: Effort normal.  Abdominal: Soft. Bowel sounds are increased. There is tenderness in the left lower quadrant. There is no rigidity, no rebound and no guarding.  Neurological: She is alert and oriented to person, place, and time.    ED Course  Procedures (including critical care time) Labs Review Labs Reviewed  CBC WITH DIFFERENTIAL/PLATELET    Imaging Review No results found.   MDM   1. Diarrhea   2. Hypotension, unspecified hypotension type    Spoke with her PCPs office and her pharmacy. Her upper respiratory infection was treated with Augmentin the end of December. She has just completed a course of Flagyl 250 mg 3 times a day.  [facility] of likely C. Difficile. We were not able to collect a stool specimen. However, her PCP did put her on a 10 day course of Flagyl 250 mg 3 times a day starting January 21. She did not respond to the antibiotic. She also has relative hypotension with blood pressures around 100/60. Baseline is more like 140/80. IV placed and fluids started.    Charm RingsErin J Leva Baine, MD 08/17/14 1229

## 2014-08-17 NOTE — ED Notes (Signed)
MD at bedside. 

## 2014-08-17 NOTE — ED Notes (Signed)
Seen by her PCP last week for cough, started on new antibiotics (finished) but has had frequent stools. No appointment available in her PCP office today

## 2014-08-17 NOTE — Progress Notes (Signed)
Gave report to night nurse. Pt. Is stable with no signs of distress noted.

## 2014-08-17 NOTE — ED Notes (Signed)
Called and reported to CullodenMelissa, CN, to advise of pending transfer via [facility]. Called [facility] for transfer. Attempted IV start x 1 w/o success

## 2014-08-17 NOTE — ED Provider Notes (Signed)
CSN: [id number]     Arrival date & time 08/17/14  1346 History   First MD Initiated Contact with Patient 08/17/14 1350     Chief Complaint  Patient presents with  . Diarrhea      HPI Patient presents to emergency room after transfer from urgent care center for evaluation of profuse diarrhea after antibiotics.  She has 6-8 watery stools per day and some blood noted.  She denies fever or chills.  She had 2 rounds of antibiotics.  Diarrhea started after the second round.  She had some relative hypotension at the urgent care. Past Medical History  Diagnosis Date  . Coronary atherosclerosis of native coronary artery     NSTEMI 11/10, LCx stent  . Hypertension   . Chest pain   . Obesity   . Diabetes mellitus     type II  . Gout   . Low back pain     chronic   Past Surgical History  Procedure Laterality Date  . Appendectomy    . Coronary stent placement     Family History  Problem Relation Age of Onset  . Heart attack Father 43    deceased  . Stroke Mother 81    deceased  . Other      no CAD in siblings (3 brothers and 1 sister)   History  Substance Use Topics  . Smoking status: Never Smoker   . Smokeless tobacco: Not on file  . Alcohol Use: No   OB History    No data available     Review of Systems  All other systems reviewed and are negative  Allergies  Allopurinol and Sulfonamide derivatives  Home Medications   Prior to Admission medications   Medication Sig Start Date End Date Taking? Authorizing Provider  albuterol (PROVENTIL HFA;VENTOLIN HFA) 108 (90 BASE) MCG/ACT inhaler Inhale 2 puffs into the lungs 4 (four) times daily. 07/04/14  Yes Reuben Likes, MD  aspirin 81 MG tablet Take 81 mg by mouth daily.   Yes Historical Provider, MD  atorvastatin (LIPITOR) 80 MG tablet Take 80 mg by mouth at bedtime.    Yes Historical Provider, MD  carvedilol (COREG) 12.5 MG tablet Take 12.5 mg by mouth 2 (two) times daily. 08/14/14  Yes Historical Provider, MD  ferrous  sulfate 325 (65 FE) MG tablet Take 325 mg by mouth at bedtime.    Yes Historical Provider, MD  ipratropium (ATROVENT) 0.06 % nasal spray Place 2 sprays into both nostrils 4 (four) times daily. 07/04/14  Yes Reuben Likes, MD  colchicine 0.6 MG tablet Take 1 tablet (0.6 mg total) by mouth daily. 08/21/14   Belkys A Regalado, MD  furosemide (LASIX) 20 MG tablet Take 1 tablet (20 mg total) by mouth daily. 08/21/14   Belkys A Regalado, MD  predniSONE (DELTASONE) 20 MG tablet Take 1 tablet for 3 days then half tablet for 2 days then stop. 08/21/14   Belkys A Regalado, MD  vancomycin (VANCOCIN) 50 mg/mL oral solution Take 2.5 mLs (125 mg total) by mouth every 6 (six) hours. 08/20/14   Belkys A Regalado, MD   BP 123/44 mmHg  Pulse 69  Temp(Src) 98.3 F (36.8 C) (Oral)  Resp 19  Ht  (1.6 m)  Wt 200 lb 14.4 oz (91.128 kg)  BMI 35.60 kg/m2  SpO2 94% Physical Exam Physical Exam  Nursing note and vitals reviewed. Constitutional: She is oriented to person, place, and time. She appears well-developed and well-nourished. No distress.  HENT:  Head: Normocephalic and atraumatic.  Eyes: Pupils are equal, round, and reactive to light.  Neck: Normal range of motion.  Cardiovascular: Normal rate and intact distal pulses.   Pulmonary/Chest: No respiratory distress.  Abdominal: Normal appearance. She exhibits no distension.  mild tenderness to palpation all quadrants.  No rebound no guarding.  No peritoneal signs or involuntary guarding. Musculoskeletal: Normal range of motion.  Neurological: She is alert and oriented to person, place, and time. No cranial nerve deficit.  Skin: Skin is warm and dry. No rash noted.  Psychiatric: She has a normal mood and affect. Her behavior is normal.   ED Course  Procedures (including critical care time) Medications  calcium gluconate 1 g in sodium chloride 0.9 % 100 mL IVPB (0 g Intravenous Stopped 08/17/14 1821)  calcium gluconate 1 g in sodium chloride 0.9 % 100 mL IVPB  (1 g Intravenous Given 08/18/14 0648)  magnesium sulfate IVPB 2 g 50 mL (2 g Intravenous Given 08/18/14 1849)  carvedilol (COREG) tablet 6.25 mg (6.25 mg Oral Given 08/20/14 1301)  magnesium sulfate IVPB 2 g 50 mL (2 g Intravenous Given 08/20/14 1750)    Labs Review Labs Reviewed  CLOSTRIDIUM DIFFICILE BY PCR - Abnormal; Notable for the following:    C difficile by pcr POSITIVE (*)    All other components within normal limits  COMPREHENSIVE METABOLIC PANEL - Abnormal; Notable for the following:    Glucose, Bld 303 (*)    BUN 37 (*)    Creatinine, Ser 1.83 (*)    Calcium 6.6 (*)    Total Protein 5.9 (*)    Albumin 2.7 (*)    GFR calc non Af Amer 25 (*)    GFR calc Af Amer 29 (*)    All other components within normal limits  CBC WITH DIFFERENTIAL/PLATELET - Abnormal; Notable for the following:    RBC 3.22 (*)    Hemoglobin 10.3 (*)    HCT 32.6 (*)    MCV 101.2 (*)    Platelets 101 (*)    Neutrophils Relative % 92 (*)    Lymphocytes Relative 6 (*)    Lymphs Abs 0.4 (*)    Monocytes Relative 2 (*)    All other components within normal limits  HEMOGLOBIN A1C - Abnormal; Notable for the following:    Hgb A1c MFr Bld 8.0 (*)    All other components within normal limits  CBC - Abnormal; Notable for the following:    RBC 2.91 (*)    Hemoglobin 9.2 (*)    HCT 29.0 (*)    Platelets 100 (*)    All other components within normal limits  COMPREHENSIVE METABOLIC PANEL - Abnormal; Notable for the following:    Glucose, Bld 194 (*)    BUN 36 (*)    Creatinine, Ser 1.71 (*)    Calcium 6.4 (*)    Total Protein 4.8 (*)    Albumin 2.2 (*)    GFR calc non Af Amer 27 (*)    GFR calc Af Amer 31 (*)    All other components within normal limits  MAGNESIUM - Abnormal; Notable for the following:    Magnesium 1.1 (*)    All other components within normal limits  GLUCOSE, CAPILLARY - Abnormal; Notable for the following:    Glucose-Capillary 273 (*)    All other components within normal limits   GLUCOSE, CAPILLARY - Abnormal; Notable for the following:    Glucose-Capillary 167 (*)    All  other components within normal limits  GLUCOSE, CAPILLARY - Abnormal; Notable for the following:    Glucose-Capillary 326 (*)    All other components within normal limits  GLUCOSE, CAPILLARY - Abnormal; Notable for the following:    Glucose-Capillary 261 (*)    All other components within normal limits  CBC - Abnormal; Notable for the following:    RBC 2.77 (*)    Hemoglobin 8.8 (*)    HCT 27.7 (*)    Platelets 100 (*)    All other components within normal limits  BASIC METABOLIC PANEL - Abnormal; Notable for the following:    Glucose, Bld 144 (*)    BUN 26 (*)    Creatinine, Ser 1.24 (*)    Calcium 6.9 (*)    GFR calc non Af Amer 40 (*)    GFR calc Af Amer 46 (*)    All other components within normal limits  GLUCOSE, CAPILLARY - Abnormal; Notable for the following:    Glucose-Capillary 213 (*)    All other components within normal limits  GLUCOSE, CAPILLARY - Abnormal; Notable for the following:    Glucose-Capillary 136 (*)    All other components within normal limits  GLUCOSE, CAPILLARY - Abnormal; Notable for the following:    Glucose-Capillary 186 (*)    All other components within normal limits  IRON AND TIBC - Abnormal; Notable for the following:    Iron 30 (*)    TIBC 120 (*)    UIBC 90 (*)    All other components within normal limits  FERRITIN - Abnormal; Notable for the following:    Ferritin 327 (*)    All other components within normal limits  RETICULOCYTES - Abnormal; Notable for the following:    RBC. 2.92 (*)    All other components within normal limits  MAGNESIUM - Abnormal; Notable for the following:    Magnesium 1.2 (*)    All other components within normal limits  CBC - Abnormal; Notable for the following:    RBC 2.92 (*)    Hemoglobin 9.3 (*)    HCT 29.2 (*)    Platelets 94 (*)    All other components within normal limits  BASIC METABOLIC PANEL -  Abnormal; Notable for the following:    Glucose, Bld 176 (*)    Creatinine, Ser 1.22 (*)    Calcium 6.9 (*)    GFR calc non Af Amer 40 (*)    GFR calc Af Amer 47 (*)    All other components within normal limits  GLUCOSE, CAPILLARY - Abnormal; Notable for the following:    Glucose-Capillary 167 (*)    All other components within normal limits  GLUCOSE, CAPILLARY - Abnormal; Notable for the following:    Glucose-Capillary 182 (*)    All other components within normal limits  LACTIC ACID, PLASMA - Abnormal; Notable for the following:    Lactic Acid, Venous 2.3 (*)    All other components within normal limits  GLUCOSE, CAPILLARY - Abnormal; Notable for the following:    Glucose-Capillary 153 (*)    All other components within normal limits  GLUCOSE, CAPILLARY - Abnormal; Notable for the following:    Glucose-Capillary 225 (*)    All other components within normal limits  BASIC METABOLIC PANEL - Abnormal; Notable for the following:    Glucose, Bld 217 (*)    Creatinine, Ser 1.17 (*)    Calcium 7.3 (*)    GFR calc non Af Amer 43 (*)  GFR calc Af Amer 49 (*)    All other components within normal limits  GLUCOSE, CAPILLARY - Abnormal; Notable for the following:    Glucose-Capillary 294 (*)    All other components within normal limits  GLUCOSE, CAPILLARY - Abnormal; Notable for the following:    Glucose-Capillary 293 (*)    All other components within normal limits  GLUCOSE, CAPILLARY - Abnormal; Notable for the following:    Glucose-Capillary 152 (*)    All other components within normal limits  GLUCOSE, CAPILLARY - Abnormal; Notable for the following:    Glucose-Capillary 159 (*)    All other components within normal limits  VITAMIN B12  FOLATE  LACTIC ACID, PLASMA  MAGNESIUM  I-STAT CG4 LACTIC ACID, ED    Imaging Review No results found.    DG Abd Acute W/Chest (Final result) Result time: 08/17/14 15:21:56   Final result by Rad Results In Interface (08/17/14  15:21:56)   Narrative:   CLINICAL DATA: Diarrhea for 3 days. Recent antibiotics for bronchitis. Hypertension. Smoker.  EXAM: ACUTE ABDOMEN SERIES (ABDOMEN 2 VIEW & CHEST 1 VIEW)  COMPARISON: Chest radiograph 11/18/2010  FINDINGS: Frontal view of the chest demonstrates midline trachea. Mild cardiomegaly. Right hemidiaphragm elevation. No pleural effusion or pneumothorax. Diffuse peribronchial thickening. Patchy opacity at the left lung base laterally is similar and favored to represent an area of scarring. Low lung volumes with resultant pulmonary interstitial prominence.  Abdominal films demonstrate convex left thoracolumbar spine curvature. No free intraperitoneal air. No significant air fluid levels. Cholecystectomy. No bowel distension on supine imaging. Distal gas and stool.  IMPRESSION: No acute findings.  Mild cardiomegaly and chronic interstitial thickening, likely due to smoking.  Left base opacity is similar and favored to represent scarring.   Electronically Signed By: Jeronimo Greaves M.D. On: 08/17/2014 15:21      MDM   Final diagnoses:  Diarrhea        Nelia Shi, MD 08/24/14 1153

## 2014-08-17 NOTE — ED Notes (Signed)
Per [facility], patient was taking antibiotics for bronchitis and has had 6-8 loose stools per day x3 days. Alert and oriented x4

## 2014-08-17 NOTE — H&P (Signed)
Patient Demographics  [name], is a 81 y.o. female  MRN: [id number]   DOB - March 12, 1931  Admit Date - 08/17/2014  Outpatient Primary MD for the patient is [name], Lorenda IshiharaEDWIN JAY, MD   With History of -  Past Medical History  Diagnosis Date  . Coronary atherosclerosis of native coronary artery     NSTEMI 11/10, LCx stent  . Hypertension   . Chest pain   . Obesity   . Diabetes mellitus     type II  . Gout   . Low back pain     chronic      Past Surgical History  Procedure Laterality Date  . Appendectomy    . Coronary stent placement      in for   Chief Complaint  Patient presents with  . Diarrhea     HPI  Laura SitesRuby Kelly  is a 81 y.o. female,  with history of CAD stent in 2010 follows with Dr. Excell Seltzerooper, essential hypertension, obesity, type 2 diabetes mellitus not on any medications, chronic low back pain, who was given azithromycin for 10 days about 2 weeks ago for bronchitis along with 20 mg of prednisone twice a day, she subsequently developed diarrhea and was given oral Flagyl for a few days without much benefit, diarrhea has been present for about 7-8 days, for the last 3 days her diarrhea has become progressively worse, she is going to the bathroom 7-8 times a day, initially presented to the urgent care [facility]  [facility] her workup was suggestive of dehydration, hypotension and acute renal failure. I was called to admit the patient for possible C. difficile infection with previously mentioned problems. Patient currently denies any fever or chills, no headache, no chest pain, she never had any abdominal pain or nausea, she does have some intermittent red blood in stool secondary to hemorrhoids but no change in that pattern, no melena, no new joint pains or aches, no  skin rashes or bruises. No focal weakness.    Review of Systems    In addition to the HPI above,   No Fever-chills, No Headache, No changes with Vision or hearing, No problems swallowing food or Liquids, No Chest pain, Cough or Shortness of Breath, No Abdominal pain, No Nausea or Vommitting, positive diarrhea  No Blood in stool or Urine, No dysuria, No new skin rashes or bruises, No new joints pains-aches,  No new weakness, tingling, numbness in any extremity, positive generalized weakness and fatigue No recent weight gain or loss, No polyuria, polydypsia or polyphagia, No significant Mental Stressors.  A full 10 point Review of Systems was done, except as stated above, all other Review of Systems were negative.   Social History History  Substance Use Topics  . Smoking status: Never Smoker   . Smokeless tobacco: Not on file  . Alcohol Use: No  Family History Family History  Problem Relation Age of Onset  . Heart attack Father 36    deceased  . Stroke Mother 19    deceased  . Other      no CAD in siblings (3 brothers and 1 sister)      Prior to Admission medications   Medication Sig Start Date End Date Taking? Authorizing Provider  albuterol (PROVENTIL HFA;VENTOLIN HFA) 108 (90 BASE) MCG/ACT inhaler Inhale 2 puffs into the lungs 4 (four) times daily. 07/04/14   Reuben Likes, MD  aspirin 81 MG tablet Take 81 mg by mouth daily.    Historical Provider, MD  atorvastatin (LIPITOR) 80 MG tablet Take 80 mg by mouth daily.      Historical Provider, MD  benzonatate (TESSALON) 200 MG capsule Take 1 capsule (200 mg total) by mouth 3 (three) times daily as needed for cough. 07/04/14   Reuben Likes, MD  carvedilol (COREG) 6.25 MG tablet take 1 tablet by mouth twice a day 07/04/12   Micheline Chapman, MD  ferrous sulfate 325 (65 FE) MG tablet Take 325 mg by mouth daily with breakfast.      Historical Provider, MD  furosemide (LASIX) 40 MG tablet Take 40 mg by mouth daily.  1/2 tablet daily 10/07/11   Historical Provider, MD  ipratropium (ATROVENT) 0.06 % nasal spray Place 2 sprays into both nostrils 4 (four) times daily. 07/04/14   Reuben Likes, MD  pantoprazole (PROTONIX) 40 MG tablet take 1 tablet by mouth once daily 10/07/11   Micheline Chapman, MD  predniSONE (DELTASONE) 20 MG tablet Take 1 tablet (20 mg total) by mouth 2 (two) times daily. 07/04/14   Reuben Likes, MD  probenecid (BENEMID) 500 MG tablet Take 500 mg by mouth 2 (two) times daily.     Historical Provider, MD  valsartan-hydrochlorothiazide (DIOVAN-HCT) 160-12.5 MG per tablet Take 1 tablet by mouth daily.    Historical Provider, MD    Allergies  Allergen Reactions  . Allopurinol   . Sulfonamide Derivatives     Physical Exam  Vitals  Blood pressure 109/54, pulse 79, temperature 97.8 F (36.6 C), resp. rate 17, height 5\' 3"  (1.6 m), weight 87.998 kg (194 lb), SpO2 96 %.   1. General elderly obese white female lying in bed in NAD,    2. Normal affect and insight, Not Suicidal or Homicidal, Awake Alert, Oriented X 3.  3. No F.N deficits, ALL C.Nerves Intact, Strength 5/5 all 4 extremities, Sensation intact all 4 extremities, Plantars down going.  4. Ears and Eyes appear Normal, Conjunctivae clear, PERRLA. Moist Oral Mucosa.  5. Supple Neck, No JVD, No cervical lymphadenopathy appriciated, No Carotid Bruits.  6. Symmetrical Chest wall movement, Good air movement bilaterally, CTAB.  7. RRR, No Gallops, Rubs or Murmurs, No Parasternal Heave.  8. Positive Bowel Sounds, Abdomen Soft, No tenderness, No organomegaly appriciated,No rebound -guarding or rigidity.  9.  No Cyanosis, Normal Skin Turgor, No Skin Rash or Bruise.  10. Good muscle tone,  joints appear normal , no effusions, Normal ROM.  11. No Palpable Lymph Nodes in Neck or Axillae     Data Review  CBC  Recent Labs Lab 08/17/14 1205 08/17/14 1530  WBC 9.8 7.5  HGB 10.6* 10.3*  HCT 33.3* 32.6*  PLT 116* 101*  MCV  100.0 101.2*  MCH 31.8 32.0  MCHC 31.8 31.6  RDW 13.9 14.0  LYMPHSABS 0.6* 0.4*  MONOABS 0.4 0.2  EOSABS 0.0 0.0  BASOSABS 0.0 0.0   ------------------------------------------------------------------------------------------------------------------  Chemistries   Recent Labs Lab 08/17/14 1530  NA 140  K 4.5  CL 103  CO2 26  GLUCOSE 303*  BUN 37*  CREATININE 1.83*  CALCIUM 6.6*  AST 13  ALT 15  ALKPHOS 90  BILITOT 0.4   ------------------------------------------------------------------------------------------------------------------ estimated creatinine clearance is 25.3 mL/min (by C-G formula based on Cr of 1.83). ------------------------------------------------------------------------------------------------------------------ No results for input(s): TSH, T4TOTAL, T3FREE, THYROIDAB in the last 72 hours.  Invalid input(s): FREET3   Coagulation profile No results for input(s): INR, PROTIME in the last 168 hours. ------------------------------------------------------------------------------------------------------------------- No results for input(s): DDIMER in the last 72 hours. -------------------------------------------------------------------------------------------------------------------  Cardiac Enzymes No results for input(s): CKMB, TROPONINI, MYOGLOBIN in the last 168 hours.  Invalid input(s): CK ------------------------------------------------------------------------------------------------------------------ Invalid input(s): POCBNP   ---------------------------------------------------------------------------------------------------------------  Urinalysis No results found for: COLORURINE, APPEARANCEUR, LABSPEC, PHURINE, GLUCOSEU, HGBUR, BILIRUBINUR, KETONESUR, PROTEINUR, UROBILINOGEN, NITRITE, LEUKOCYTESUR  ----------------------------------------------------------------------------------------------------------------  Imaging results:   Dg Abd  Acute W/chest  08/17/2014   CLINICAL DATA:  Diarrhea for 3 days. Recent antibiotics for bronchitis. Hypertension. Smoker.  EXAM: ACUTE ABDOMEN SERIES (ABDOMEN 2 VIEW & CHEST 1 VIEW)  COMPARISON:  Chest radiograph 11/18/2010  FINDINGS: Frontal view of the chest demonstrates midline trachea. Mild cardiomegaly. Right hemidiaphragm elevation. No pleural effusion or pneumothorax. Diffuse peribronchial thickening. Patchy opacity at the left lung base laterally is similar and favored to represent an area of scarring. Low lung volumes with resultant pulmonary interstitial prominence.  Abdominal films demonstrate convex left thoracolumbar spine curvature. No free intraperitoneal air. No significant air fluid levels. Cholecystectomy. No bowel distension on supine imaging. Distal gas and stool.  IMPRESSION: No acute findings.  Mild cardiomegaly and chronic interstitial thickening, likely due to smoking.  Left base opacity is similar and favored to represent scarring.   Electronically Signed   By: Jeronimo Greaves M.D.   On: 08/17/2014 15:21         Assessment & Plan   1. Diarrhea. Suspicious for C. difficile infection. Will be admitted to MedSurg bed, C. difficile PCR checked, place on oral vancomycin as she has failed Flagyl outpatient.    2. Dehydration, hypotension and ARF. Due to #1 above. IV fluid bolus and maintenance, hold Lasix and ARB. Monitor BMP. Blood pressure has improved after 500 mL bolus in the ER. Currently stable.   3. DM type II. Was on diet control. Check A1c, sugars high likely due to her being on steroids. Sliding scale for now.   4. Recent steroid use. Was using 20 mg of prednisone twice a day. We will cut it down to once a day. If blood pressure falls despite IV fluids will stop oral steroids and place her on stress dose IV. Blood pressures improved after IV fluids and lactate is normal.   5. CAD. No acute issues. Continue aspirin and statin for secondary prevention, commence Coreg  from tomorrow morning.   6. Hypocalcemia. Replace. Recheck in the morning.     DVT Prophylaxis Heparin    AM Labs Ordered, also please review Full Orders  Family Communication: Admission, patients condition and plan of care including tests being ordered have been discussed with the patient and family who indicate understanding and agree with the plan and Code Status.  Code Status Full  Likely DC to  Home  Condition Fair  Time spent in minutes : 35    Dechelle Attaway K M.D on 08/17/2014 at 4:22 PM  Between 7am to 7pm - Pager - [phone]  After  7pm go to [email or web address] - password [facility]  [facility] Office  [phone]

## 2014-08-17 NOTE — ED Notes (Signed)
Delay in labs r/t already having same tests done [facility] Dr. Radford PaxBeaton made aware.

## 2014-08-17 NOTE — Progress Notes (Signed)
Laura SitesRuby Kelly [id number] Code Status: Full   Admission Data: 08/17/2014 7:00 PM Attending Provider:  Thedore MinsSingh  WUJ:[id number]:[name], Laura IshiharaEDWIN JAY, MD Consults/ Treatment Team:    Laura SitesRuby Kelly is a 81 y.o. female patient admitted from ED awake, alert - oriented  X 4 - no acute distress noted.  VSS - Blood pressure 145/62, pulse 77, temperature 98.6 F (37 C), temperature source Oral, resp. rate 16, height 5\' 3"  (1.6 m), weight 88.2 kg (194 lb 7.1 oz), SpO2 97 %.    IV in place, occlusive dsg intact without redness.  Orientation to room, and floor completed with information packet given to patient/family.   SR up x 2, fall assessment complete, with patient and family able to verbalize understanding of risk associated with falls, and verbalized understanding to call nsg before up out of bed.  Call light within reach, patient able to voice, and demonstrate understanding.     Will cont to eval and treat per MD orders.  Al DecantFlores, Aaliyana Fredericks F, [place]RN 08/17/2014 7:00 PM

## 2014-08-18 DIAGNOSIS — A0472 Enterocolitis due to Clostridium difficile, not specified as recurrent: Secondary | ICD-10-CM

## 2014-08-18 LAB — COMPREHENSIVE METABOLIC PANEL
ALBUMIN: 2.2 g/dL — AB (ref 3.5–5.2)
ALK PHOS: 75 U/L (ref 39–117)
ALT: 11 U/L (ref 0–35)
AST: 11 U/L (ref 0–37)
Anion gap: 9 (ref 5–15)
BILIRUBIN TOTAL: 0.4 mg/dL (ref 0.3–1.2)
BUN: 36 mg/dL — AB (ref 6–23)
CHLORIDE: 109 mmol/L (ref 96–112)
CO2: 24 mmol/L (ref 19–32)
CREATININE: 1.71 mg/dL — AB (ref 0.50–1.10)
Calcium: 6.4 mg/dL — CL (ref 8.4–10.5)
GFR calc Af Amer: 31 mL/min — ABNORMAL LOW (ref >90)
GFR, EST NON AFRICAN AMERICAN: 27 mL/min — AB (ref >90)
Glucose, Bld: 194 mg/dL — ABNORMAL HIGH (ref 70–99)
Potassium: 4 mmol/L (ref 3.5–5.1)
SODIUM: 142 mmol/L (ref 135–145)
Total Protein: 4.8 g/dL — ABNORMAL LOW (ref 6.0–8.3)

## 2014-08-18 LAB — GLUCOSE, CAPILLARY
Glucose-Capillary: 167 mg/dL — ABNORMAL HIGH (ref 70–99)
Glucose-Capillary: 326 mg/dL — ABNORMAL HIGH (ref 70–99)
Glucose-Capillary: 261 mg/dL — ABNORMAL HIGH (ref 70–99)
Glucose-Capillary: 213 mg/dL — ABNORMAL HIGH (ref 70–99)

## 2014-08-18 LAB — MAGNESIUM: Magnesium: 1.1 mg/dL — ABNORMAL LOW (ref 1.5–2.5)

## 2014-08-18 LAB — CBC
HEMATOCRIT: 29 % — AB (ref 36.0–46.0)
Hemoglobin: 9.2 g/dL — ABNORMAL LOW (ref 12.0–15.0)
MCH: 31.6 pg (ref 26.0–34.0)
MCHC: 31.7 g/dL (ref 30.0–36.0)
MCV: 99.7 fL (ref 78.0–100.0)
Platelets: 100 10*3/uL — ABNORMAL LOW (ref 150–400)
RBC: 2.91 MIL/uL — AB (ref 3.87–5.11)
RDW: 13.9 % (ref 11.5–15.5)
WBC: 7.6 10*3/uL (ref 4.0–10.5)

## 2014-08-18 LAB — HEMOGLOBIN A1C
Hgb A1c MFr Bld: 8 % — ABNORMAL HIGH (ref 4.8–5.6)
Mean Plasma Glucose: 183 mg/dL

## 2014-08-18 LAB — CLOSTRIDIUM DIFFICILE BY PCR: Toxigenic C. Difficile by PCR: POSITIVE — AB

## 2014-08-18 MED ORDER — SODIUM CHLORIDE 0.9 % IV SOLN
INTRAVENOUS | Status: DC
  Administered 2014-08-19: 01:00:00 via INTRAVENOUS

## 2014-08-18 MED ORDER — MAGNESIUM SULFATE 2 GM/50ML IV SOLN
2.0000 g | Freq: Once | INTRAVENOUS | Status: AC
  Administered 2014-08-18: 2 g via INTRAVENOUS
  Filled 2014-08-18 (×2): qty 50

## 2014-08-18 MED ORDER — SODIUM CHLORIDE 0.9 % IV SOLN
1.0000 g | Freq: Once | INTRAVENOUS | Status: AC
  Administered 2014-08-18: 1 g via INTRAVENOUS
  Filled 2014-08-18: qty 10

## 2014-08-18 NOTE — Significant Event (Signed)
CRITICAL VALUE ALERT  Critical value received:  Calcium level -6.4  Date of notification:  08/18/2014  Time of notification:  06:05  Critical value read back:Yes.    Nurse who received alert:  Thelma CompNorlen Cynthea Zachman RN  MD notified (1st page):  Mr. Benedetto Coons. Callahan NP  Time of first page:  06:08  MD notified (2nd page):  Time of second page:  Responding MD:  Benedetto Coons. Callahan NP  Time MD responded:  06:15

## 2014-08-18 NOTE — Progress Notes (Signed)
[facility] PROGRESS NOTE  [name] UJW:[id number] DOB: [birth year] DOA: 08/17/2014 PCP: Enrique SackGREEN, EDWIN JAY, MD  Assessment/Plan: 1-C diff colitis;  Improving on oral vancomycin. She only received 7 days of flagyl outpatient.  Day 2 vancomycin. Will need 14 days treatment.  C diff positive.   2-Acute on chronic Renal Failure; in setting of decrease volume, hypotension.  Cr per records 1.3 to 1.6 Improving with IV fluids.  Cr trend: 1.8---1.7  3-Diabetes; uncontrolled.  Stop prednisone.  SSI. HBA1 c at 8.8. She will need to be started on medication or insulin at discharge.   Thrombocytopenia;  Probably in setting in infection. Also has chronic thrombocytopenia.   Anemia; hb stable.   Hypomagnesemia; IV magnesium.  Hypocalcemia; received IV calcium. Calcium corrected by albumin: 7.8   Code Status: full code.  Family Communication: care discussed with daughter who was at bedside.  Disposition Plan: home when diarrhea  And renal function better.    Consultants:  none  Procedures:  none  Antibiotics:  Vancomycin 2-5  HPI/Subjective: Feeling better. Diarrhea less frequent. Had 2 episode overnight, and 2 episode this morning. She received 7 days of flagyl.   Objective: Filed Vitals:   08/18/14 1333  BP: 138/71  Pulse: 82  Temp: 98.4 F (36.9 C)  Resp: 18    Intake/Output Summary (Last 24 hours) at 08/18/14 1509 Last data filed at 08/18/14 1411  Gross per 24 hour  Intake    822 ml  Output    950 ml  Net   -128 ml   Filed Weights   08/17/14 1352 08/17/14 1845 08/18/14 0615  Weight: 87.998 kg (194 lb) 88.2 kg (194 lb 7.1 oz) 90.6 kg (199 lb 11.8 oz)    Exam:   General:  Alert in no distress.   Cardiovascular: S 1, S 2 RRR  Respiratory: CTA  Abdomen: BS present, obese, mild tenderness.   Musculoskeletal: no edema.   Data Reviewed: Basic Metabolic Panel:  Recent Labs Lab 08/17/14 1530 08/18/14 0432  NA 140 142  K 4.5 4.0  CL 103 109   CO2 26 24  GLUCOSE 303* 194*  BUN 37* 36*  CREATININE 1.83* 1.71*  CALCIUM 6.6* 6.4*  MG  --  1.1*   Liver Function Tests:  Recent Labs Lab 08/17/14 1530 08/18/14 0432  AST 13 11  ALT 15 11  ALKPHOS 90 75  BILITOT 0.4 0.4  PROT 5.9* 4.8*  ALBUMIN 2.7* 2.2*   No results for input(s): LIPASE, AMYLASE in the last 168 hours. No results for input(s): AMMONIA in the last 168 hours. CBC:  Recent Labs Lab 08/17/14 1205 08/17/14 1530 08/18/14 0432  WBC 9.8 7.5 7.6  NEUTROABS 8.8* 6.9  --   HGB 10.6* 10.3* 9.2*  HCT 33.3* 32.6* 29.0*  MCV 100.0 101.2* 99.7  PLT 116* 101* 100*   Cardiac Enzymes: No results for input(s): CKTOTAL, CKMB, CKMBINDEX, TROPONINI in the last 168 hours. BNP (last 3 results) No results for input(s): BNP in the last 8760 hours.  ProBNP (last 3 results) No results for input(s): PROBNP in the last 8760 hours.  CBG:  Recent Labs Lab 08/17/14 2124 08/18/14 0751 08/18/14 1146  GLUCAP 273* 167* 326*    Recent Results (from the past 240 hour(s))  Clostridium Difficile by PCR     Status: Abnormal   Collection Time: 08/18/14  2:03 AM  Result Value Ref Range Status   C difficile by pcr POSITIVE (A) NEGATIVE Final    Comment: CRITICAL RESULT  CALLED TO, READ BACK BY AND VERIFIED WITH: ZHU,J RN 08/18/14 1118 WOOTEN,K      Studies: Dg Abd Acute W/chest  08/17/2014   CLINICAL DATA:  Diarrhea for 3 days. Recent antibiotics for bronchitis. Hypertension. Smoker.  EXAM: ACUTE ABDOMEN SERIES (ABDOMEN 2 VIEW & CHEST 1 VIEW)  COMPARISON:  Chest radiograph 11/18/2010  FINDINGS: Frontal view of the chest demonstrates midline trachea. Mild cardiomegaly. Right hemidiaphragm elevation. No pleural effusion or pneumothorax. Diffuse peribronchial thickening. Patchy opacity at the left lung base laterally is similar and favored to represent an area of scarring. Low lung volumes with resultant pulmonary interstitial prominence.  Abdominal films demonstrate convex left  thoracolumbar spine curvature. No free intraperitoneal air. No significant air fluid levels. Cholecystectomy. No bowel distension on supine imaging. Distal gas and stool.  IMPRESSION: No acute findings.  Mild cardiomegaly and chronic interstitial thickening, likely due to smoking.  Left base opacity is similar and favored to represent scarring.   Electronically Signed   By: Jeronimo Greaves M.D.   On: 08/17/2014 15:21    Scheduled Meds: . aspirin  81 mg Oral Daily  . atorvastatin  80 mg Oral Daily  . carvedilol  6.25 mg Oral BID  . heparin  5,000 Units Subcutaneous 3 times per day  . insulin aspart  0-5 Units Subcutaneous QHS  . insulin aspart  0-9 Units Subcutaneous TID WC  . vancomycin  250 mg Oral 4 times per day   Continuous Infusions: . sodium chloride 100 mL/hr at 08/18/14 1247    Principal Problem:   Diarrhea Active Problems:   Obesity   HYPERTENSION, BENIGN   CORONARY ATHEROSCLEROSIS NATIVE CORONARY ARTERY   Dehydration    Time spent: 35 minutes.     Hartley Barefoot A  [facility] Pager [phone]. If 7PM-7AM, please contact night-coverage at [email or web address], password [facility] 08/18/2014, 3:09 PM  LOS: 1 day

## 2014-08-19 DIAGNOSIS — A047 Enterocolitis due to Clostridium difficile: Principal | ICD-10-CM

## 2014-08-19 LAB — BASIC METABOLIC PANEL
Anion gap: 6 (ref 5–15)
BUN: 26 mg/dL — AB (ref 6–23)
CHLORIDE: 110 mmol/L (ref 96–112)
CO2: 25 mmol/L (ref 19–32)
CREATININE: 1.24 mg/dL — AB (ref 0.50–1.10)
Calcium: 6.9 mg/dL — ABNORMAL LOW (ref 8.4–10.5)
GFR calc Af Amer: 46 mL/min — ABNORMAL LOW (ref >90)
GFR calc non Af Amer: 40 mL/min — ABNORMAL LOW (ref >90)
Glucose, Bld: 144 mg/dL — ABNORMAL HIGH (ref 70–99)
POTASSIUM: 3.8 mmol/L (ref 3.5–5.1)
SODIUM: 141 mmol/L (ref 135–145)

## 2014-08-19 LAB — CBC
HEMATOCRIT: 27.7 % — AB (ref 36.0–46.0)
Hemoglobin: 8.8 g/dL — ABNORMAL LOW (ref 12.0–15.0)
MCH: 31.8 pg (ref 26.0–34.0)
MCHC: 31.8 g/dL (ref 30.0–36.0)
MCV: 100 fL (ref 78.0–100.0)
Platelets: 100 10*3/uL — ABNORMAL LOW (ref 150–400)
RBC: 2.77 MIL/uL — ABNORMAL LOW (ref 3.87–5.11)
RDW: 13.9 % (ref 11.5–15.5)
WBC: 6.2 10*3/uL (ref 4.0–10.5)

## 2014-08-19 LAB — GLUCOSE, CAPILLARY
GLUCOSE-CAPILLARY: 136 mg/dL — AB (ref 70–99)
GLUCOSE-CAPILLARY: 186 mg/dL — AB (ref 70–99)
GLUCOSE-CAPILLARY: 182 mg/dL — AB (ref 70–99)
Glucose-Capillary: 167 mg/dL — ABNORMAL HIGH (ref 70–99)

## 2014-08-19 NOTE — Progress Notes (Signed)
[facility] PROGRESS NOTE  [name] [id number]:[id number] DOB: May 05, 1931 DOA: 08/17/2014 PCP: Laura Sack, MD  Assessment/Plan: 1-C diff colitis;  Improving on oral vancomycin. She only received 7 days of flagyl outpatient.  Day 3 vancomycin. Will need 14 days treatment.  C diff positive.  She report 5 BM yesterday. 2 BM today so far.   2-Acute on chronic Renal Failure; in setting of decrease volume, hypotension.  Cr per records 1.3 to 1.6 Improving with IV fluids.  Cr trend: 1.8---1.7---1.2  3-Diabetes; uncontrolled.  Stop prednisone.  SSI. HBA1 c at 8.8. She will need to be started on medication or insulin at discharge.   Thrombocytopenia;  Probably in setting in infection. Also has chronic thrombocytopenia.   Anemia; hb trending down. Denies blood in the stool.  Check anemia panel.   Hypomagnesemia; IV magnesium. Repeat mg level in am/   Hypocalcemia; received IV calcium. Calcium corrected by albumin: 7.8 Will order calcium supplement.   Code Status: full code.  Family Communication: care discussed with husband  Disposition Plan: home when diarrhea  And renal function better.    Consultants:  none  Procedures:  none  Antibiotics:  Vancomycin 2-5  HPI/Subjective: Feeling well, had 5 BM yesterday. 2 Bm so far today.  No abdominal pain.  No blood in the stool   Objective: Filed Vitals:   08/19/14 1343  BP: 144/82  Pulse: 80  Temp: 97.9 F (36.6 C)  Resp: 18    Intake/Output Summary (Last 24 hours) at 08/19/14 1539 Last data filed at 08/19/14 1427  Gross per 24 hour  Intake 2406.67 ml  Output    800 ml  Net 1606.67 ml   Filed Weights   08/17/14 1845 08/18/14 0615 08/19/14 0520  Weight: 88.2 kg (194 lb 7.1 oz) 90.6 kg (199 lb 11.8 oz) 92.9 kg (204 lb 12.9 oz)    Exam:   General:  Alert in no distress.   Cardiovascular: S 1, S 2 RRR  Respiratory: CTA  Abdomen: BS present, obese, mild tenderness.   Musculoskeletal: no edema.    Data Reviewed: Basic Metabolic Panel:  Recent Labs Lab 08/17/14 1530 08/18/14 0432 08/19/14 0610  NA 140 142 141  K 4.5 4.0 3.8  CL 103 109 110  CO2 GLUCOSE 303* 194* 144*  BUN 37* 36* 26*  CREATININE 1.83* 1.71* 1.24*  CALCIUM 6.6* 6.4* 6.9*  MG  --  1.1*  --    Liver Function Tests:  Recent Labs Lab 08/17/14 1530 08/18/14 0432  AST 13 11  ALT 15 11  ALKPHOS 90 75  BILITOT 0.4 0.4  PROT 5.9* 4.8*  ALBUMIN 2.7* 2.2*   No results for input(s): LIPASE, AMYLASE in the last 168 hours. No results for input(s): AMMONIA in the last 168 hours. CBC:  Recent Labs Lab 08/17/14 1205 08/17/14 1530 08/18/14 0432 08/19/14 0610  WBC 9.8 7.5 7.6 6.2  NEUTROABS 8.8* 6.9  --   --   HGB 10.6* 10.3* 9.2* 8.8*  HCT 33.3* 32.6* 29.0* 27.7*  MCV 100.0 101.2* 99.7 100.0  PLT 116* 101* 100* 100*   Cardiac Enzymes: No results for input(s): CKTOTAL, CKMB, CKMBINDEX, TROPONINI in the last 168 hours. BNP (last 3 results) No results for input(s): BNP in the last 8760 hours.  ProBNP (last 3 results) No results for input(s): PROBNP in the last 8760 hours.  CBG:  Recent Labs Lab 08/18/14 1146 08/18/14 1646 08/18/14 2220 08/19/14 0744 08/19/14 1140  GLUCAP 326* 261*  213* 136* 186*    Recent Results (from the past 240 hour(s))  Clostridium Difficile by PCR     Status: Abnormal   Collection Time: 08/18/14  2:03 AM  Result Value Ref Range Status   C difficile by pcr POSITIVE (Kelly) NEGATIVE Final    Comment: CRITICAL RESULT CALLED TO, READ BACK BY AND VERIFIED WITH: ZHU,J RN 08/18/14 1118 WOOTEN,K      Studies: No results found.  Scheduled Meds: . aspirin  81 mg Oral Daily  . atorvastatin  80 mg Oral Daily  . carvedilol  6.25 mg Oral BID  . heparin  5,000 Units Subcutaneous 3 times per day  . insulin aspart  0-5 Units Subcutaneous QHS  . insulin aspart  0-9 Units Subcutaneous TID WC  . vancomycin  250 mg Oral 4 times per day   Continuous Infusions: .  sodium chloride 100 mL/hr at 08/19/14 0126    Principal Problem:   Enteritis due to Clostridium difficile Active Problems:   Obesity   HYPERTENSION, BENIGN   CORONARY ATHEROSCLEROSIS NATIVE CORONARY ARTERY   Diarrhea   Dehydration   Hypomagnesemia    Time spent: 35 minutes.     [name], [name]  [facility] Pager [phone]. If 7PM-7AM, please contact night-coverage at [email or web address], password [email or web address] 08/19/2014, 3:39 PM  LOS: 2 days

## 2014-08-20 LAB — IRON AND TIBC
Iron: 30 ug/dL — ABNORMAL LOW (ref 42–145)
SATURATION RATIOS: 25 % (ref 20–55)
TIBC: 120 ug/dL — ABNORMAL LOW (ref 250–470)
UIBC: 90 ug/dL — ABNORMAL LOW (ref 125–400)

## 2014-08-20 LAB — RETICULOCYTES
RBC.: 2.92 MIL/uL — AB (ref 3.87–5.11)
RETIC COUNT ABSOLUTE: 49.6 10*3/uL (ref 19.0–186.0)
Retic Ct Pct: 1.7 % (ref 0.4–3.1)

## 2014-08-20 LAB — CBC
HCT: 29.2 % — ABNORMAL LOW (ref 36.0–46.0)
HEMOGLOBIN: 9.3 g/dL — AB (ref 12.0–15.0)
MCH: 31.8 pg (ref 26.0–34.0)
MCHC: 31.8 g/dL (ref 30.0–36.0)
MCV: 100 fL (ref 78.0–100.0)
Platelets: 94 10*3/uL — ABNORMAL LOW (ref 150–400)
RBC: 2.92 MIL/uL — AB (ref 3.87–5.11)
RDW: 13.8 % (ref 11.5–15.5)
WBC: 4.8 10*3/uL (ref 4.0–10.5)

## 2014-08-20 LAB — BASIC METABOLIC PANEL
Anion gap: 6 (ref 5–15)
BUN: 18 mg/dL (ref 6–23)
CO2: 26 mmol/L (ref 19–32)
Calcium: 6.9 mg/dL — ABNORMAL LOW (ref 8.4–10.5)
Chloride: 108 mmol/L (ref 96–112)
Creatinine, Ser: 1.22 mg/dL — ABNORMAL HIGH (ref 0.50–1.10)
GFR calc Af Amer: 47 mL/min — ABNORMAL LOW (ref >90)
GFR, EST NON AFRICAN AMERICAN: 40 mL/min — AB (ref >90)
GLUCOSE: 176 mg/dL — AB (ref 70–99)
Potassium: 4.1 mmol/L (ref 3.5–5.1)
Sodium: 140 mmol/L (ref 135–145)

## 2014-08-20 LAB — FOLATE: FOLATE: 5.9 ng/mL

## 2014-08-20 LAB — VITAMIN B12: VITAMIN B 12: 261 pg/mL (ref 211–911)

## 2014-08-20 LAB — FERRITIN: Ferritin: 327 ng/mL — ABNORMAL HIGH (ref 10–291)

## 2014-08-20 LAB — LACTIC ACID, PLASMA
LACTIC ACID, VENOUS: 2.3 mmol/L — AB (ref 0.5–2.0)
Lactic Acid, Venous: 0.8 mmol/L (ref 0.5–2.0)

## 2014-08-20 LAB — GLUCOSE, CAPILLARY: Glucose-Capillary: 153 mg/dL — ABNORMAL HIGH (ref 70–99)

## 2014-08-20 LAB — MAGNESIUM: MAGNESIUM: 1.2 mg/dL — AB (ref 1.5–2.5)

## 2014-08-20 MED ORDER — VANCOMYCIN 50 MG/ML ORAL SOLUTION
125.0000 mg | Freq: Four times a day (QID) | ORAL | Status: DC
Start: 2014-08-20 — End: 2020-12-04

## 2014-08-20 MED ORDER — MAGNESIUM SULFATE 2 GM/50ML IV SOLN
2.0000 g | Freq: Once | INTRAVENOUS | Status: AC
  Administered 2014-08-20: 2 g via INTRAVENOUS
  Filled 2014-08-20: qty 50

## 2014-08-20 MED ORDER — VITAMIN B-12 100 MCG PO TABS
100.0000 ug | ORAL_TABLET | Freq: Every day | ORAL | Status: DC
  Administered 2014-08-20 – 2014-08-21 (×2): 100 ug via ORAL
  Filled 2014-08-20 (×2): qty 1

## 2014-08-20 MED ORDER — CARVEDILOL 6.25 MG PO TABS
6.2500 mg | ORAL_TABLET | Freq: Once | ORAL | Status: AC
  Administered 2014-08-20: 6.25 mg via ORAL
  Filled 2014-08-20: qty 1

## 2014-08-20 MED ORDER — CARVEDILOL 6.25 MG PO TABS
6.2500 mg | ORAL_TABLET | Freq: Two times a day (BID) | ORAL | Status: DC
Start: 2014-08-20 — End: 2014-08-20

## 2014-08-20 MED ORDER — FERROUS SULFATE 325 (65 FE) MG PO TABS
325.0000 mg | ORAL_TABLET | Freq: Two times a day (BID) | ORAL | Status: DC
  Administered 2014-08-20 – 2014-08-21 (×2): 325 mg via ORAL
  Filled 2014-08-20 (×4): qty 1

## 2014-08-20 MED ORDER — SODIUM CHLORIDE 0.9 % IV SOLN
INTRAVENOUS | Status: DC
  Administered 2014-08-20: 16:00:00 via INTRAVENOUS

## 2014-08-20 MED ORDER — COLCHICINE 0.6 MG PO TABS
0.6000 mg | ORAL_TABLET | Freq: Every day | ORAL | Status: DC
  Administered 2014-08-20 – 2014-08-21 (×2): 0.6 mg via ORAL
  Filled 2014-08-20 (×2): qty 1

## 2014-08-20 MED ORDER — PREDNISONE 20 MG PO TABS: 20.0000 mg | ORAL_TABLET | Freq: Every day | ORAL | Status: DC

## 2014-08-20 MED ORDER — CALCIUM GLUCONATE 500 MG PO TABS
1.0000 | ORAL_TABLET | Freq: Three times a day (TID) | ORAL | Status: DC
  Administered 2014-08-20 – 2014-08-21 (×4): 500 mg via ORAL
  Filled 2014-08-20 (×6): qty 1

## 2014-08-20 MED ORDER — PREDNISONE 20 MG PO TABS
20.0000 mg | ORAL_TABLET | Freq: Every day | ORAL | Status: DC
  Administered 2014-08-20 – 2014-08-21 (×2): 20 mg via ORAL
  Filled 2014-08-20 (×3): qty 1

## 2014-08-20 MED ORDER — CARVEDILOL 12.5 MG PO TABS
12.5000 mg | ORAL_TABLET | Freq: Two times a day (BID) | ORAL | Status: DC
  Administered 2014-08-20 – 2014-08-21 (×2): 12.5 mg via ORAL
  Filled 2014-08-20 (×3): qty 1

## 2014-08-20 MED ORDER — VANCOMYCIN 50 MG/ML ORAL SOLUTION
125.0000 mg | Freq: Four times a day (QID) | ORAL | Status: DC
  Administered 2014-08-20 – 2014-08-21 (×5): 125 mg via ORAL
  Filled 2014-08-20 (×8): qty 2.5

## 2014-08-20 NOTE — Progress Notes (Signed)
Medicare Important Message given?  YES (If response is "NO", the following Medicare IM given date fields will be blank) Date Medicare IM given:  08/20/14 Medicare IM given by:  Jonte Wollam 

## 2014-08-20 NOTE — Progress Notes (Signed)
Charted on wrong patient

## 2014-08-20 NOTE — Progress Notes (Signed)
Pt's current PIV is not working , unable to flush the line. IV team attempted to get PIV x3 in  this shift . On-call provider Benedetto Coons Callahan, NP notified via textpage. No new orders received at this time. Pt has been drinking well and encouraged to drink more oral fluids.

## 2014-08-20 NOTE — Progress Notes (Signed)
Utilization review completed. Mckinnon Glick, RN, BSN. 

## 2014-08-20 NOTE — Progress Notes (Addendum)
MD notified due to acute change in patient status(MEWS fired). Vital signs and labs are listed below.  MD paged time : [id number] Responding MD: Benedetto Coonst callahan, NP Time MD responded: [email or web address] MD response: order received for serum lactic acid check  Vital Signs Filed Vitals:   08/19/14 1050 08/19/14 1343 08/19/14 2205 08/20/14 0529  BP: 129/60 144/82 152/69 129/75  Pulse:  80 93 105  Temp:  97.9 F (36.6 C) 97.6 F (36.4 C) 98.4 F (36.9 C)  TempSrc:  Oral Oral Oral  Resp:  18 17 26   Height:      Weight:      SpO2:  98% 98% 94%     Lab Results WBC  Date/Time Value Ref Range Status  08/19/2014 06:10 AM 6.2 4.0 - 10.5 K/uL Final  08/18/2014 04:32 AM 7.6 4.0 - 10.5 K/uL Final  08/17/2014 03:30 PM 7.5 4.0 - 10.5 K/uL Final   NEUTROPHILS RELATIVE %  Date/Time Value Ref Range Status  08/17/2014 03:30 PM 92* 43 - 77 % Final  08/17/2014 12:05 PM 90* 43 - 77 % Final  11/18/2010 01:16 PM 70 43 - 77 % Final   No results found for: PCO2ART LACTIC ACID, VENOUS  Date/Time Value Ref Range Status  08/17/2014 03:41 PM 1.03 0.5 - 2.0 mmol/L Final   No results found for: Jearld ShinesCO2VEN   Everet Flagg, RN 08/20/2014, 5:48 AM

## 2014-08-20 NOTE — Progress Notes (Signed)
[facility] PROGRESS NOTE  [name] ZOX:[id number] DOB: Apr 26, 1931 DOA: 08/17/2014 PCP: Enrique SackGREEN, EDWIN JAY, MD  Assessment/Plan: 1-C diff colitis;  Improving on oral vancomycin. She only received 7 days of flagyl outpatient.  Day 4 vancomycin. Will need 14 days treatment.  C diff positive.  Diarrhea improving. Report nausea this morning.   2-Acute on chronic Renal Failure; in setting of decrease volume, hypotension.  Cr per records 1.3 to 1.6 Improving with IV fluids.  Cr trend: 1.8---1.7---1.2  3-Diabetes; uncontrolled.  Stop prednisone.  SSI. HBA1 c at 8.8. She will need to be started on medication or insulin at discharge.   4-Thrombocytopenia;  Probably in setting in infection. Also has chronic thrombocytopenia.   5-Anemia; hb trending down. Denies blood in the stool.  anemia panel.  Will start iron and B 12 supplement.   6-Hypomagnesemia; replete IV.   7-Hypocalcemia; received IV calcium. Calcium corrected by albumin: 7.8 Will order calcium supplement.  8-Tachycardia; increase coreg to home dose.    Code Status: full code.  Family Communication: care discussed with husband  Disposition Plan: home when diarrhea  And renal function better.    Consultants:  none  Procedures:  none  Antibiotics:  Vancomycin 2-5  HPI/Subjective: Had some nausea this morning.  Diarrhea better, had only 3 episode yesterday./   Objective: Filed Vitals:   08/20/14 1301  BP: 118/80  Pulse:   Temp:   Resp:     Intake/Output Summary (Last 24 hours) at 08/20/14 1611 Last data filed at 08/20/14 1513  Gross per 24 hour  Intake    930 ml  Output    650 ml  Net    280 ml   Filed Weights   08/18/14 0615 08/19/14 0520 08/20/14 0529  Weight: 90.6 kg (199 lb 11.8 oz) 92.9 kg (204 lb 12.9 oz) 92.851 kg (204 lb 11.2 oz)    Exam:   General:  Alert in no distress.   Cardiovascular: S 1, S 2 RRR  Respiratory: CTA  Abdomen: BS present, obese, mild tenderness.    Musculoskeletal: no edema.   Data Reviewed: Basic Metabolic Panel:  Recent Labs Lab 08/17/14 1530 08/18/14 0432 08/19/14 0610 08/20/14 0620  NA 140 142 141 140  K 4.5 4.0 3.8 4.1  CL 103 109 110 108  CO2 26 24 25 26   GLUCOSE 303* 194* 144* 176*  BUN 37* 36* 26* 18  CREATININE 1.83* 1.71* 1.24* 1.22*  CALCIUM 6.6* 6.4* 6.9* 6.9*  MG  --  1.1*  --  1.2*   Liver Function Tests:  Recent Labs Lab 08/17/14 1530 08/18/14 0432  AST 13 11  ALT 15 11  ALKPHOS 90 75  BILITOT 0.4 0.4  PROT 5.9* 4.8*  ALBUMIN 2.7* 2.2*   No results for input(s): LIPASE, AMYLASE in the last 168 hours. No results for input(s): AMMONIA in the last 168 hours. CBC:  Recent Labs Lab 08/17/14 1205 08/17/14 1530 08/18/14 0432 08/19/14 0610 08/20/14 0620  WBC 9.8 7.5 7.6 6.2 4.8  NEUTROABS 8.8* 6.9  --   --   --   HGB 10.6* 10.3* 9.2* 8.8* 9.3*  HCT 33.3* 32.6* 29.0* 27.7* 29.2*  MCV 100.0 101.2* 99.7 100.0 100.0  PLT 116* 101* 100* 100* 94*   Cardiac Enzymes: No results for input(s): CKTOTAL, CKMB, CKMBINDEX, TROPONINI in the last 168 hours. BNP (last 3 results) No results for input(s): BNP in the last 8760 hours.  ProBNP (last 3 results) No results for input(s): PROBNP in the last  8760 hours.  CBG:  Recent Labs Lab 08/19/14 0744 08/19/14 1140 08/19/14 1653 08/19/14 2251 08/20/14 0811  GLUCAP 136* 186* 167* 182* 153*    Recent Results (from the past 240 hour(s))  Clostridium Difficile by PCR     Status: Abnormal   Collection Time: 08/18/14  2:03 AM  Result Value Ref Range Status   C difficile by pcr POSITIVE (A) NEGATIVE Final    Comment: CRITICAL RESULT CALLED TO, READ BACK BY AND VERIFIED WITH: ZHU,J RN 08/18/14 1118 WOOTEN,K      Studies: No results found.  Scheduled Meds: . aspirin  81 mg Oral Daily  . atorvastatin  80 mg Oral Daily  . calcium gluconate  1 tablet Oral TID  . carvedilol  12.5 mg Oral BID  . colchicine  0.6 mg Oral Daily  . heparin  5,000  Units Subcutaneous 3 times per day  . insulin aspart  0-5 Units Subcutaneous QHS  . insulin aspart  0-9 Units Subcutaneous TID WC  . predniSONE  20 mg Oral Q breakfast  . vancomycin  125 mg Oral 4 times per day   Continuous Infusions: . sodium chloride      Principal Problem:   Enteritis due to Clostridium difficile Active Problems:   Obesity   HYPERTENSION, BENIGN   CORONARY ATHEROSCLEROSIS NATIVE CORONARY ARTERY   Diarrhea   Dehydration   Hypomagnesemia    Time spent: 35 minutes.     Hartley Barefoot A  [facility] Pager [phone]. If 7PM-7AM, please contact night-coverage at [email or web address], password [facility] 08/20/2014, 4:11 PM  LOS: 3 days

## 2014-08-20 NOTE — Progress Notes (Signed)
CRITICAL VALUE ALERT  Critical value received:  Lactic acid 2.3  Date of notification:  08/20/14  Time of notification:  1125  Critical value read back:Yes.    Nurse who received alert:  Tita Terhaar   MD notified (1st page):  Regalado  Time of first page:  1125  MD notified (2nd page):  Time of second page:  Responding MD:  Regalado   Time MD responded:  1130

## 2014-08-21 LAB — BASIC METABOLIC PANEL
Anion gap: 5 (ref 5–15)
BUN: 16 mg/dL (ref 6–23)
CO2: 27 mmol/L (ref 19–32)
Calcium: 7.3 mg/dL — ABNORMAL LOW (ref 8.4–10.5)
Chloride: 109 mmol/L (ref 96–112)
Creatinine, Ser: 1.17 mg/dL — ABNORMAL HIGH (ref 0.50–1.10)
GFR calc non Af Amer: 43 mL/min — ABNORMAL LOW (ref >90)
GFR, EST AFRICAN AMERICAN: 49 mL/min — AB (ref >90)
Glucose, Bld: 217 mg/dL — ABNORMAL HIGH (ref 70–99)
POTASSIUM: 4 mmol/L (ref 3.5–5.1)
SODIUM: 141 mmol/L (ref 135–145)

## 2014-08-21 LAB — GLUCOSE, CAPILLARY
GLUCOSE-CAPILLARY: 225 mg/dL — AB (ref 70–99)
GLUCOSE-CAPILLARY: 294 mg/dL — AB (ref 70–99)
Glucose-Capillary: 159 mg/dL — ABNORMAL HIGH (ref 70–99)

## 2014-08-21 LAB — MAGNESIUM: Magnesium: 1.7 mg/dL (ref 1.5–2.5)

## 2014-08-21 MED ORDER — FUROSEMIDE 20 MG PO TABS: 20.0000 mg | ORAL_TABLET | Freq: Every day | ORAL | Status: DC

## 2014-08-21 MED ORDER — ALBUTEROL SULFATE (2.5 MG/3ML) 0.083% IN NEBU: 2.5000 mg | INHALATION_SOLUTION | Freq: Four times a day (QID) | RESPIRATORY_TRACT | Status: DC

## 2014-08-21 MED ORDER — PREDNISONE 20 MG PO TABS: ORAL_TABLET | ORAL | Status: DC

## 2014-08-21 MED ORDER — FUROSEMIDE 40 MG PO TABS: 40.0000 mg | ORAL_TABLET | Freq: Every day | ORAL | Status: DC

## 2014-08-21 MED ORDER — COLCHICINE 0.6 MG PO TABS: 0.6000 mg | ORAL_TABLET | Freq: Every day | ORAL | Status: DC

## 2014-08-21 MED ORDER — FUROSEMIDE 20 MG PO TABS
20.0000 mg | ORAL_TABLET | Freq: Every day | ORAL | Status: DC
  Administered 2014-08-21: 20 mg via ORAL
  Filled 2014-08-21: qty 1

## 2014-08-21 MED ORDER — IPRATROPIUM-ALBUTEROL 0.5-2.5 (3) MG/3ML IN SOLN
3.0000 mL | RESPIRATORY_TRACT | Status: DC
  Administered 2014-08-21: 3 mL via RESPIRATORY_TRACT
  Filled 2014-08-21: qty 3

## 2014-08-21 NOTE — Progress Notes (Signed)
Physical Therapy Evaluation Patient Details Name: Laura SitesRuby Kelly MRN: [id number] DOB: Apr 02, 1931 Today's Date: 08/21/2014   History of Present Illness  Laura SitesRuby Sebring  is a 81 y.o. female,  with history of CAD stent in 2010 follows with Dr. Excell Seltzerooper, essential hypertension, obesity, type 2 diabetes mellitus not on any medications, chronic low back pain, who was given azithromycin for 10 days about 2 weeks ago for bronchitis along with 20 mg of prednisone twice a day, she subsequently developed diarrhea and was given oral Flagyl for a few days without much benefit, diarrhea has been present for about 7-8 days, for the last 3 days her diarrhea has become progressively worse, she is going to the bathroom 7-8 times a day, initially presented to the urgent care and [facility]  Clinical Impression  Pt at supervision level and expected to quickly move to an independent level in a homelike environment.  Pt will be safe with husband's assist.  Sign off from PT.    Follow Up Recommendations No PT follow up    Equipment Recommendations  None recommended by PT    Recommendations for Other Services       Precautions / Restrictions Precautions Precautions: Fall      Mobility  Bed Mobility                  Transfers Overall transfer level: Needs assistance   Transfers: Sit to/from Stand Sit to Stand: Supervision         General transfer comment: safe standing technique  Ambulation/Gait Ambulation/Gait assistance: Supervision Ambulation Distance (Feet): 240 Feet Assistive device: None Gait Pattern/deviations: Step-through pattern Gait velocity: able to speed up minimally, still prefers slower   General Gait Details: generally steady with episodes of mild unsteadiness  Stairs Stairs: Yes Stairs assistance: Supervision Stair Management: One rail Right;Step to pattern;Sideways;Forwards Number of Stairs: 3 General stair comments: steady on stairs with  rail  Wheelchair Mobility    Modified Rankin (Stroke Patients Only)       Balance                                             Pertinent Vitals/Pain Pain Assessment: No/denies pain    Home Living Family/patient expects to be discharged to:: Private residence Living Arrangements: Spouse/significant other Available Help at Discharge: Family;Available 24 hours/day Type of Home: House Home Access: Stairs to enter Entrance Stairs-Rails: Doctor, general practiceight;Left Entrance Stairs-Number of Steps: 1/4 Home Layout: One level Home Equipment: None      Prior Function Level of Independence: Independent               Hand Dominance        Extremity/Trunk Assessment   Upper Extremity Assessment: Generalized weakness;Overall WFL for tasks assessed           Lower Extremity Assessment: Overall WFL for tasks assessed         Communication   Communication: No difficulties  Cognition Arousal/Alertness: Awake/alert Behavior During Therapy: WFL for tasks assessed/performed Overall Cognitive Status: Within Functional Limits for tasks assessed                      General Comments      Exercises        Assessment/Plan    PT Assessment Patent does not need any further PT services  PT  Diagnosis     PT Problem List    PT Treatment Interventions     PT Goals (Current goals can be found in the Care Plan section) Acute Rehab PT Goals PT Goal Formulation: All assessment and education complete, DC therapy    Frequency     Barriers to discharge        Co-evaluation               End of Session   Activity Tolerance: Patient tolerated treatment well Patient left: in chair;with call bell/phone within reach;with family/visitor present Nurse Communication: Mobility status         Time: 1610-9604 PT Time Calculation (min) (ACUTE ONLY): 23 min   Charges:   PT Evaluation $Initial PT Evaluation Tier I: 1 Procedure PT Treatments $Gait  Training: 8-22 mins   PT G Codes:        Safaa Stingley, Eliseo Gum 08/21/2014, 11:38 AM 08/21/2014  Humphrey Bing, PT [phone]  (pager)

## 2014-08-21 NOTE — Progress Notes (Signed)
NURSING PROGRESS NOTE  Laura SitesRuby Kelly [id number] Discharge Data: 08/21/2014 3:26 PM Attending Provider: Alba CoryBelkys A Regalado, MD WUJ:[id number]:GREEN, Lorenda IshiharaEDWIN JAY, MD     Laura Sitesuby Dunkerson to be D/C'd Home per MD order.  Discussed with the patient the After Visit Summary and all questions fully answered. All IV's discontinued with no bleeding noted. All belongings returned to patient for patient to take home.   Last Vital Signs:  Blood pressure 123/44, pulse 69, temperature 98.3 F (36.8 C), temperature source Oral, resp. rate 19, height 5\' 3"  (1.6 m), weight 91.128 kg (200 lb 14.4 oz), SpO2 94 %.  Discharge Medication List   Medication List    STOP taking these medications        pantoprazole 40 MG tablet  Commonly known as:  PROTONIX     probenecid 500 MG tablet  Commonly known as:  BENEMID     valsartan-hydrochlorothiazide 160-12.5 MG per tablet  Commonly known as:  DIOVAN-HCT      TAKE these medications        albuterol 108 (90 BASE) MCG/ACT inhaler  Commonly known as:  PROVENTIL HFA;VENTOLIN HFA  Inhale 2 puffs into the lungs 4 (four) times daily.     aspirin 81 MG tablet  Take 81 mg by mouth daily.     atorvastatin 80 MG tablet  Commonly known as:  LIPITOR  Take 80 mg by mouth at bedtime.     carvedilol 12.5 MG tablet  Commonly known as:  COREG  Take 12.5 mg by mouth 2 (two) times daily.     colchicine 0.6 MG tablet  Take 1 tablet (0.6 mg total) by mouth daily.     ferrous sulfate 325 (65 FE) MG tablet  Take 325 mg by mouth at bedtime.     furosemide 20 MG tablet  Commonly known as:  LASIX  Take 1 tablet (20 mg total) by mouth daily.     ipratropium 0.06 % nasal spray  Commonly known as:  ATROVENT  Place 2 sprays into both nostrils 4 (four) times daily.     predniSONE 20 MG tablet  Commonly known as:  DELTASONE  Take 1 tablet for 3 days then half tablet for 2 days then stop.     vancomycin 50 mg/mL oral solution  Commonly known as:  VANCOCIN  Take 2.5 mLs (125 mg total) by  mouth every 6 (six) hours.         Cathlyn Parsonsattha Alvin Rubano, RN

## 2014-08-21 NOTE — Progress Notes (Signed)
MD Regalado verbal order to give albuterol neb now. RT notified

## 2014-08-21 NOTE — Discharge Summary (Signed)
Physician Discharge Summary  [name] EVO:[id number] DOB: 25-Jun-1931 DOA: 08/17/2014  PCP: Criselda Peaches, MD  Admit date: 08/17/2014 Discharge date: 08/21/2014  Time spent: 35  minutes  Recommendations for Outpatient Follow-up:  1. Need B-met to follow renal function 2. Follow up on resolution of c diff.  3. Need follow up for Diabetes.  4. Needs repeat B 12. Mg level  Discharge Diagnoses:    Enteritis due to Clostridium difficile   Obesity   HYPERTENSION, BENIGN   CORONARY ATHEROSCLEROSIS NATIVE CORONARY ARTERY   Diarrhea   Dehydration   Hypomagnesemia   Discharge Condition: Stable.   Diet recommendation: Carb modified.   Filed Weights   08/19/14 0520 08/20/14 0529 08/21/14 0500  Weight: 92.9 kg (204 lb 12.9 oz) 92.851 kg (204 lb 11.2 oz) 91.128 kg (200 lb 14.4 oz)    History of present illness:  [name] is a 81 y.o. female, with history of CAD stent in 2010 follows with Dr. Burt Knack, essential hypertension, obesity, type 2 diabetes mellitus not on any medications, chronic low back pain, who was given azithromycin for 10 days about 2 weeks ago for bronchitis along with 20 mg of prednisone twice a day, she subsequently developed diarrhea and was given oral Flagyl for a few days without much benefit, diarrhea has been present for about 7-8 days, for the last 3 days her diarrhea has become progressively worse, she is going to the bathroom 7-8 times a day, initially presented to the urgent care and then was sent to [facility] ER.  In the [facility] ER her workup was suggestive of dehydration, hypotension and acute renal failure. I was called to admit the patient for possible C. difficile infection with previously mentioned problems. Patient currently denies any fever or chills, no headache, no chest pain, she never had any abdominal pain or nausea, she does have some intermittent red blood in stool secondary to hemorrhoids but no change in that pattern, no melena, no new joint  pains or aches, no skin rashes or bruises. No focal weakness.  Hospital Course:  1-C diff colitis;  Improving on oral vancomycin. She only received 7 days of flagyl outpatient.  Day 5 vancomycin. Will need 14 days treatment.  C diff positive.  Diarrhea resolved.   2-Acute on chronic Renal Failure; in setting of decrease volume, hypotension.  Cr per records 1.3 to 1.6 Improving with IV fluids.  Cr trend: 1.8---1.7---1.2  3-Diabetes; uncontrolled.  Stop prednisone.  SSI. HBA1 c at 8.8.  I have called Glipizide to the pharmacy.   4-Thrombocytopenia;  Probably in setting in infection. Also has chronic thrombocytopenia.   5-Anemia; hb trending down. Denies blood in the stool.  anemia panel.  Will start iron and B 12 supplement.   6-Hypomagnesemia; replaced.   7-Hypocalcemia; received IV calcium. Calcium corrected by albumin: 7.8 Will order calcium supplement.  8-Tachycardia; increase coreg to home dose.  9-Gout; will be discharge on colchicine.  10-Wheezing; will resume home albuterol and lasix.  Procedures:  none  Consultations:  none  Discharge Exam: Filed Vitals:   08/21/14 0547  BP: 130/64  Pulse: 72  Temp: 97.8 F (36.6 C)  Resp: 16    General: Alert in no distress.  Cardiovascular: S 1, S 2 RRR Respiratory: CTA  Discharge Instructions   Discharge Instructions    Diet - low sodium heart healthy    Complete by:  As directed      Increase activity slowly    Complete by:  As directed           Current Discharge Medication List    START taking these medications   Details  colchicine 0.6 MG tablet Take 1 tablet (0.6 mg total) by mouth daily. Qty: 7 tablet, Refills: 0    predniSONE (DELTASONE) 20 MG tablet Take 1 tablet for 3 days then half tablet for 2 days then stop. Qty: 4 tablet, Refills: 0    vancomycin (VANCOCIN) 50 mg/mL oral solution Take 2.5 mLs (125 mg total) by mouth every 6 (six) hours. Qty: 100 mL, Refills: 0      CONTINUE  these medications which have NOT CHANGED   Details  albuterol (PROVENTIL HFA;VENTOLIN HFA) 108 (90 BASE) MCG/ACT inhaler Inhale 2 puffs into the lungs 4 (four) times daily. Qty: 1 Inhaler, Refills: 0    aspirin 81 MG tablet Take 81 mg by mouth daily.    atorvastatin (LIPITOR) 80 MG tablet Take 80 mg by mouth at bedtime.     carvedilol (COREG) 12.5 MG tablet Take 12.5 mg by mouth 2 (two) times daily. Refills: 0    ferrous sulfate 325 (65 FE) MG tablet Take 325 mg by mouth at bedtime.     furosemide (LASIX) 40 MG tablet Take 40 mg by mouth daily. 1/2 tablet daily    ipratropium (ATROVENT) 0.06 % nasal spray Place 2 sprays into both nostrils 4 (four) times daily. Qty: 15 mL, Refills: 12      STOP taking these medications     pantoprazole (PROTONIX) 40 MG tablet      probenecid (BENEMID) 500 MG tablet      valsartan-hydrochlorothiazide (DIOVAN-HCT) 160-12.5 MG per tablet      benzonatate (TESSALON) 200 MG capsule        Allergies  Allergen Reactions  . Allopurinol Hives  . Sulfonamide Derivatives Hives   Follow-up Information    Follow up with GREEN, EDWIN JAY, MD In 1 week.   Specialty:  Internal Medicine   Contact information:   [address] [place] [address] [phone]        The results of significant diagnostics from this hospitalization (including imaging, microbiology, ancillary and laboratory) are listed below for reference.    Significant Diagnostic Studies: Dg Abd Acute W/chest  08/17/2014   CLINICAL DATA:  Diarrhea for 3 days. Recent antibiotics for bronchitis. Hypertension. Smoker.  EXAM: ACUTE ABDOMEN SERIES (ABDOMEN 2 VIEW & CHEST 1 VIEW)  COMPARISON:  Chest radiograph 11/18/2010  FINDINGS: Frontal view of the chest demonstrates midline trachea. Mild cardiomegaly. Right hemidiaphragm elevation. No pleural effusion or pneumothorax. Diffuse peribronchial thickening. Patchy opacity at the left lung base laterally is similar and favored  to represent an area of scarring. Low lung volumes with resultant pulmonary interstitial prominence.  Abdominal films demonstrate convex left thoracolumbar spine curvature. No free intraperitoneal air. No significant air fluid levels. Cholecystectomy. No bowel distension on supine imaging. Distal gas and stool.  IMPRESSION: No acute findings.  Mild cardiomegaly and chronic interstitial thickening, likely due to smoking.  Left base opacity is similar and favored to represent scarring.   Electronically Signed   By: Abigail Miyamoto M.D.   On: 08/17/2014 15:21    Microbiology: Recent Results (from the past 240 hour(s))  Clostridium Difficile by PCR     Status: Abnormal   Collection Time: 08/18/14  2:03 AM  Result Value Ref Range Status   C difficile by pcr POSITIVE (A) NEGATIVE Final    Comment: CRITICAL RESULT CALLED  TO, READ BACK BY AND VERIFIED WITH: ZHU,J RN 08/18/14 [place]: Basic Metabolic Panel:  Recent Labs Lab 08/17/14 1530 08/18/14 0432 08/19/14 0610 08/20/14 0620  NA 140 142 141 140  K 4.5 4.0 3.8 4.1  CL 103 109 110 108  CO2 26 24 25 26   GLUCOSE 303* 194* 144* 176*  BUN 37* 36* 26* 18  CREATININE 1.83* 1.71* 1.24* 1.22*  CALCIUM 6.6* 6.4* 6.9* 6.9*  MG  --  1.1*  --  1.2*   Liver Function Tests:  Recent Labs Lab 08/17/14 1530 08/18/14 0432  AST 13 11  ALT 15 11  ALKPHOS 90 75  BILITOT 0.4 0.4  PROT 5.9* 4.8*  ALBUMIN 2.7* 2.2*   No results for input(s): LIPASE, AMYLASE in the last 168 hours. No results for input(s): AMMONIA in the last 168 hours. CBC:  Recent Labs Lab 08/17/14 1205 08/17/14 1530 08/18/14 0432 08/19/14 0610 08/20/14 0620  WBC 9.8 7.5 7.6 6.2 4.8  NEUTROABS 8.8* 6.9  --   --   --   HGB 10.6* 10.3* 9.2* 8.8* 9.3*  HCT 33.3* 32.6* 29.0* 27.7* 29.2*  MCV 100.0 101.2* 99.7 100.0 100.0  PLT 116* 101* 100* 100* 94*   Cardiac Enzymes: No results for input(s): CKTOTAL, CKMB, CKMBINDEX, TROPONINI in the last 168  hours. BNP: BNP (last 3 results) No results for input(s): BNP in the last 8760 hours.  ProBNP (last 3 results) No results for input(s): PROBNP in the last 8760 hours.  CBG:  Recent Labs Lab 08/19/14 0744 08/19/14 1140 08/19/14 1653 08/19/14 2251 08/20/14 0811  GLUCAP 136* 186* 167* 182* 153*       Signed:  Miguel Medal A  [facility] 08/21/2014, 9:54 AM

## 2014-08-21 NOTE — Progress Notes (Signed)
CARE MANAGEMENT NOTE 08/21/2014  Patient:  [name],[name]   Account Number:  [id number]  Date Initiated:  08/21/2014  Documentation initiated by:  [facility]Tamryn Popko  Subjective/Objective Assessment:   C diff colitis;     Action/Plan:   Anticipated DC Date:  08/21/2014   Anticipated DC Plan:  HOME/SELF CARE      DC Planning Services  CM consult  Medication Assistance      Choice offered to / List presented to:             Status of service:  Completed, signed off Medicare Important Message given?  YES (If response is "NO", the following Medicare IM given date fields will be blank) Date Medicare IM given:  08/20/2014 Medicare IM given by:  Letha CapeAYLOR,DEBORAH Date Additional Medicare IM given:   Additional Medicare IM given by:    Discharge Disposition:  HOME/SELF CARE  Per UR Regulation:    If discussed at Long Length of Stay Meetings, dates discussed:    Comments:  08/21/2014 1030 NCM spoke to pt and lives at home with husband. Faxed [employer] conserv[employer]/restorerinsurance info and [facility] Vancomycin. Provided contact info for [facility] on [employer] instructions. Isidoro DonningAlesia Sarabelle Genson RN CCM Case Mgmt phone [phone]

## 2014-08-22 LAB — GLUCOSE, CAPILLARY
Glucose-Capillary: 293 mg/dL — ABNORMAL HIGH (ref 70–99)
Glucose-Capillary: 152 mg/dL — ABNORMAL HIGH (ref 70–99)

## 2015-03-12 ENCOUNTER — Ambulatory Visit (INDEPENDENT_AMBULATORY_CARE_PROVIDER_SITE_OTHER): Payer: Medicare Other | Admitting: Cardiovascular Disease

## 2015-03-12 ENCOUNTER — Encounter: Payer: Self-pay | Admitting: Cardiovascular Disease

## 2015-03-12 VITALS — BP 119/78 | HR 69 | Wt 191.8 lb

## 2015-03-12 DIAGNOSIS — E785 Hyperlipidemia, unspecified: Secondary | ICD-10-CM | POA: Diagnosis not present

## 2015-03-12 DIAGNOSIS — I2510 Atherosclerotic heart disease of native coronary artery without angina pectoris: Secondary | ICD-10-CM

## 2015-03-12 DIAGNOSIS — I10 Essential (primary) hypertension: Secondary | ICD-10-CM | POA: Diagnosis not present

## 2015-03-12 NOTE — Patient Instructions (Signed)

## 2015-03-12 NOTE — Progress Notes (Signed)
Cardiology Office Note   Date:  03/12/2015   ID:  [name], [name] 08/02/1930, MRN [id number]  PCP:  Enrique Sack, MD  Cardiologist:  Tonny Bollman, MD    Chief Complaint  Patient presents with  . Essential Hypertension     History of Present Illness: [name] is a 80 y.o. female who presents for follow-up of CAD, last seen in June 2015. The patient is followed for coronary artery disease after presenting with non-ST elevation infarction in 2010. At that time, she underwent cardiac catheterization demonstrating critical stenosis of left circumflex and she was treated with a drug-eluting stent. She has had no recurrent ischemic events.   She had CDif infection in February after taking a course of ABX for bronchitis. She was hospitalized for 4 days.   From a cardiac perspective she is doing well. No chest pain, chest pressure, shortness of breath, or palpitations. She has been having problems with gout in her feet, right worse than left.   Past Medical History  Diagnosis Date  . Coronary atherosclerosis of native coronary artery     NSTEMI 11/10, LCx stent  . Hypertension   . Chest pain   . Obesity   . Diabetes mellitus     type II  . Gout   . Low back pain     chronic    Past Surgical History  Procedure Laterality Date  . Appendectomy    . Coronary stent placement      Current Outpatient Prescriptions  Medication Sig Dispense Refill  . albuterol (PROVENTIL HFA;VENTOLIN HFA) 108 (90 BASE) MCG/ACT inhaler Inhale 2 puffs into the lungs 4 (four) times daily. 1 Inhaler 0  . aspirin 81 MG tablet Take 81 mg by mouth daily.    Marland Kitchen atorvastatin (LIPITOR) 80 MG tablet Take 80 mg by mouth at bedtime.     . carvedilol (COREG) 12.5 MG tablet Take 12.5 mg by mouth 2 (two) times daily.  0  . colchicine 0.6 MG tablet Take 1 tablet (0.6 mg total) by mouth daily. 7 tablet 0  . ferrous sulfate 325 (65 FE) MG tablet Take 325 mg by mouth at bedtime.     . furosemide (LASIX) 20 MG  tablet Take 1 tablet (20 mg total) by mouth daily. 30 tablet 0  . ipratropium (ATROVENT) 0.06 % nasal spray Place 2 sprays into both nostrils 4 (four) times daily. 15 mL 12  . Lancets (ACCU-CHEK MULTICLIX) lancets   0  . losartan (COZAAR) 100 MG tablet   0  . pantoprazole (PROTONIX) 40 MG tablet   0  . probenecid (BENEMID) 500 MG tablet   0  . vancomycin (VANCOCIN) 50 mg/mL oral solution Take 2.5 mLs (125 mg total) by mouth every 6 (six) hours. 100 mL 0  . ACCU-CHEK AVIVA PLUS test strip   0   No current facility-administered medications for this visit.    Allergies:   Allopurinol and Sulfonamide derivatives   Social History:  The patient  reports that she has never smoked. She does not have any smokeless tobacco history on file. She reports that she does not drink alcohol or use illicit drugs.   Family History:  The patient's  family history includes Heart attack (age of onset: 24) in her father; Other in an other family member; Stroke (age of onset: 58) in her mother.    ROS:  Please see the history of present illness.  Otherwise, review of systems is positive for leg swelling, balance  problems.  All other systems are reviewed and negative.    PHYSICAL EXAM: VS:  BP 119/78 mmHg  Pulse 69  Wt 191 lb 12 oz (86.977 kg) , BMI Body mass index is 33.98 kg/(m^2). GEN: Well nourished, well developed, pleasant overweight woman in no acute distress HEENT: normal Neck: no JVD, no masses. No carotid bruits Cardiac: RRR without murmur or gallop                Respiratory:  clear to auscultation bilaterally, normal work of breathing GI: soft, nontender, nondistended, + BS MS: no deformity or atrophy Ext: 1+ ankle edema on right, trace on left edema, pedal pulses 2+= bilaterally Skin: warm and dry, no rash Neuro:  Strength and sensation are intact Psych: euthymic mood, full affect  EKG:  EKG is ordered today. The ekg ordered today shows NSR 68 bpm, within normal limits  Recent  Labs: 08/18/2014: ALT 11 08/20/2014: Hemoglobin 9.3*; Platelets 94* 08/21/2014: BUN 16; Creatinine, Ser 1.17*; Magnesium 1.7; Potassium 4.0; Sodium 141   Lipid Panel     Component Value Date/Time   CHOL  06/06/2009 0525    182        ATP III CLASSIFICATION:  <200     mg/dL   Desirable  161-096  mg/dL   Borderline High  >=045    mg/dL   High          TRIG 409* 06/06/2009 0525   HDL 35* 06/06/2009 0525   CHOLHDL 5.2 06/06/2009 0525   VLDL 33 06/06/2009 0525   LDLCALC * 06/06/2009 0525    114        Total Cholesterol/HDL:CHD Risk Coronary Heart Disease Risk Table                     Men   Women  1/2 Average Risk   3.4   3.3  Average Risk       5.0   4.4  2 X Average Risk   9.6   7.1  3 X Average Risk  23.4   11.0        Use the calculated Patient Ratio above and the CHD Risk Table to determine the patient's CHD Risk.        ATP III CLASSIFICATION (LDL):  <100     mg/dL   Optimal  811-914  mg/dL   Near or Above                    Optimal  130-159  mg/dL   Borderline  782-956  mg/dL   High  >213     mg/dL   Very High      Wt Readings from Last 3 Encounters:  03/12/15 191 lb 12 oz (86.977 kg)  08/21/14 200 lb 14.4 oz (91.128 kg)  12/12/13 203 lb 1.9 oz (92.135 kg)    ASSESSMENT AND PLAN: 1.  CAD, native vessel, without symptoms of angina: The patient has demonstrated continued stability with respect to her coronary artery disease since her non-ST elevation infarction in 2010. She is treated with aspirin, high-dose atorvastatin, and carvedilol. Her EKG is within normal limits. She has no cardiac limitation. I will see her back in one year.  2. Essential hypertension: Blood pressure is well controlled on current medications.  3. Hyperlipidemia: Treated with atorvastatin. Lipids followed by Dr. Chilton Si.   Current medicines are reviewed with the patient today.  The patient does not have concerns regarding medicines.  Labs/ tests  ordered today include:   Orders Placed This  Encounter  Procedures  . EKG 12-Lead    Disposition:   FU one year  Signed, Tonny Bollman, MD  03/12/2015 1:06 PM    [facility] [address] [place], [place], [place]  [address] Phone: [phone]; Fax: [phone]

## 2015-03-15 ENCOUNTER — Ambulatory Visit: Payer: Medicare Other | Admitting: Cardiovascular Disease

## 2015-09-11 DIAGNOSIS — E1165 Type 2 diabetes mellitus with hyperglycemia: Secondary | ICD-10-CM | POA: Diagnosis not present

## 2015-09-11 DIAGNOSIS — I10 Essential (primary) hypertension: Secondary | ICD-10-CM | POA: Diagnosis not present

## 2015-11-06 DIAGNOSIS — Z1231 Encounter for screening mammogram for malignant neoplasm of breast: Secondary | ICD-10-CM | POA: Diagnosis not present

## 2015-11-14 DIAGNOSIS — E1165 Type 2 diabetes mellitus with hyperglycemia: Secondary | ICD-10-CM | POA: Diagnosis not present

## 2015-11-14 DIAGNOSIS — M109 Gout, unspecified: Secondary | ICD-10-CM | POA: Diagnosis not present

## 2016-01-16 DIAGNOSIS — Z0000 Encounter for general adult medical examination without abnormal findings: Secondary | ICD-10-CM | POA: Diagnosis not present

## 2016-01-16 DIAGNOSIS — I2510 Atherosclerotic heart disease of native coronary artery without angina pectoris: Secondary | ICD-10-CM | POA: Diagnosis not present

## 2016-01-16 DIAGNOSIS — I10 Essential (primary) hypertension: Secondary | ICD-10-CM | POA: Diagnosis not present

## 2016-01-16 DIAGNOSIS — E7800 Pure hypercholesterolemia, unspecified: Secondary | ICD-10-CM | POA: Diagnosis not present

## 2016-01-16 DIAGNOSIS — I739 Peripheral vascular disease, unspecified: Secondary | ICD-10-CM | POA: Diagnosis not present

## 2016-01-16 DIAGNOSIS — E1165 Type 2 diabetes mellitus with hyperglycemia: Secondary | ICD-10-CM | POA: Diagnosis not present

## 2016-01-16 DIAGNOSIS — D559 Anemia due to enzyme disorder, unspecified: Secondary | ICD-10-CM | POA: Diagnosis not present

## 2016-01-16 DIAGNOSIS — E118 Type 2 diabetes mellitus with unspecified complications: Secondary | ICD-10-CM | POA: Diagnosis not present

## 2016-01-16 DIAGNOSIS — E785 Hyperlipidemia, unspecified: Secondary | ICD-10-CM | POA: Diagnosis not present

## 2016-01-23 DIAGNOSIS — I10 Essential (primary) hypertension: Secondary | ICD-10-CM | POA: Diagnosis not present

## 2016-01-23 DIAGNOSIS — E1165 Type 2 diabetes mellitus with hyperglycemia: Secondary | ICD-10-CM | POA: Diagnosis not present

## 2016-04-01 DIAGNOSIS — I10 Essential (primary) hypertension: Secondary | ICD-10-CM | POA: Diagnosis not present

## 2016-04-01 DIAGNOSIS — M109 Gout, unspecified: Secondary | ICD-10-CM | POA: Diagnosis not present

## 2016-04-01 DIAGNOSIS — Z23 Encounter for immunization: Secondary | ICD-10-CM | POA: Diagnosis not present

## 2016-05-15 DIAGNOSIS — E119 Type 2 diabetes mellitus without complications: Secondary | ICD-10-CM | POA: Diagnosis not present

## 2016-05-21 DIAGNOSIS — E119 Type 2 diabetes mellitus without complications: Secondary | ICD-10-CM | POA: Diagnosis not present

## 2016-06-03 ENCOUNTER — Encounter: Payer: Self-pay | Admitting: *Deleted

## 2016-06-15 ENCOUNTER — Ambulatory Visit (INDEPENDENT_AMBULATORY_CARE_PROVIDER_SITE_OTHER): Payer: Medicare Other | Admitting: Cardiovascular Disease

## 2016-06-15 ENCOUNTER — Encounter (INDEPENDENT_AMBULATORY_CARE_PROVIDER_SITE_OTHER): Payer: Self-pay

## 2016-06-15 ENCOUNTER — Encounter: Payer: Self-pay | Admitting: Cardiovascular Disease

## 2016-06-15 VITALS — BP 140/70 | HR 62 | Ht 63.5 in | Wt 180.6 lb

## 2016-06-15 DIAGNOSIS — I2510 Atherosclerotic heart disease of native coronary artery without angina pectoris: Secondary | ICD-10-CM | POA: Diagnosis not present

## 2016-06-15 NOTE — Progress Notes (Signed)
Cardiology Office Note Date:  06/15/2016   ID:  [name], DOB March 29, 1931, MRN [id number]  PCP:  Enrique SackGREEN, EDWIN JAY, MD  Cardiologist:  Tonny Bollmanooper, Emilygrace Grothe, MD    Chief Complaint  Patient presents with  . Coronary Artery Disease     History of Present Illness: [name] is a 82 y.o. female who presents for follow-up evaluation. The patient has been followed for coronary artery disease, last seen in August 2016. She initially presented with a non-ST elevation infarction in 2010. The patient was found to have critical stenosis of the left circumflex and she was treated with a drug-eluting stent. She's had no recurrent ischemia.  The patient is here with her husband today. She's doing quite well. She's lost another 10 pounds through dietary changes. She denies chest pain, chest pressure, shortness of breath, or leg swelling. When I saw her last year she was having problems with gout, but since Dr. Chilton SiGreen started her on Uloric she is doing much better. No complaints today.  Past Medical History:  Diagnosis Date  . Chest pain   . Coronary atherosclerosis of native coronary artery    NSTEMI 11/10, LCx stent  . Diabetes mellitus    type II  . Gout   . Hypertension   . Low back pain    chronic  . Obesity     Past Surgical History:  Procedure Laterality Date  . APPENDECTOMY    . CORONARY STENT PLACEMENT      Current Outpatient Prescriptions  Medication Sig Dispense Refill  . ACCU-CHEK AVIVA PLUS test strip   0  . aspirin 81 MG tablet Take 81 mg by mouth daily.    Marland Kitchen. atorvastatin (LIPITOR) 80 MG tablet Take 80 mg by mouth at bedtime.     . carvedilol (COREG) 12.5 MG tablet Take 12.5 mg by mouth 2 (two) times daily.  0  . colchicine 0.6 MG tablet Take 1 tablet (0.6 mg total) by mouth daily. 7 tablet 0  . febuxostat (ULORIC) 40 MG tablet Take 40 mg by mouth daily.    . ferrous sulfate 325 (65 FE) MG tablet Take 325 mg by mouth at bedtime.     . furosemide (LASIX) 20 MG tablet Take 1  tablet (20 mg total) by mouth daily. 30 tablet 0  . ipratropium (ATROVENT) 0.06 % nasal spray Place 2 sprays into both nostrils 4 (four) times daily. 15 mL 12  . Lancets (ACCU-CHEK MULTICLIX) lancets Use as directed to check your blood sugar  0  . losartan (COZAAR) 100 MG tablet Take 100 mg by mouth daily.   0  . vancomycin (VANCOCIN) 50 mg/mL oral solution Take 2.5 mLs (125 mg total) by mouth every 6 (six) hours. 100 mL 0   No current facility-administered medications for this visit.     Allergies:   Allopurinol and Sulfonamide derivatives   Social History:  The patient  reports that she has never smoked. She has never used smokeless tobacco. She reports that she does not drink alcohol or use drugs.   Family History:  The patient's  family history includes Heart attack (age of onset: 6685) in her father; Stroke (age of onset: 2351) in her mother.    ROS:  Please see the history of present illness.  Otherwise, review of systems is positive for cough, diarrhea.  All other systems are reviewed and negative.    PHYSICAL EXAM: VS:  BP 140/70   Pulse 62   Ht 5' 3.5" (1.613 m)  Wt 180 lb 9.6 oz (81.9 kg)   BMI 31.49 kg/m  , BMI Body mass index is 31.49 kg/m. GEN: Well nourished, well developed, in no acute distress  HEENT: normal  Neck: no JVD, no masses. No carotid bruits Cardiac: RRR without murmur or gallop                Respiratory:  clear to auscultation bilaterally, normal work of breathing GI: soft, nontender, nondistended, + BS MS: no deformity or atrophy  Ext: no pretibial edema, pedal pulses 2+= bilaterally Skin: warm and dry, no rash Neuro:  Strength and sensation are intact Psych: euthymic mood, full affect  EKG:  EKG is ordered today. The ekg ordered today shows normal sinus rhythm 62 bpm, low-voltage QRS, otherwise within normal limits.  Recent Labs: No results found for requested labs within last 8760 hours.   Lipid Panel     Component Value Date/Time   CHOL   06/06/2009 0525    182        ATP III CLASSIFICATION:  <200     mg/dL   Desirable  213-086200-239  mg/dL   Borderline High  >=578>=240    mg/dL   High          TRIG 469166 (H) 06/06/2009 0525   HDL 35 (L) 06/06/2009 0525   CHOLHDL 5.2 06/06/2009 0525   VLDL 33 06/06/2009 0525   LDLCALC (H) 06/06/2009 0525    114        Total Cholesterol/HDL:CHD Risk Coronary Heart Disease Risk Table                     Men   Women  1/2 Average Risk   3.4   3.3  Average Risk       5.0   4.4  2 X Average Risk   9.6   7.1  3 X Average Risk  23.4   11.0        Use the calculated Patient Ratio above and the CHD Risk Table to determine the patient's CHD Risk.        ATP III CLASSIFICATION (LDL):  <100     mg/dL   Optimal  629-528100-129  mg/dL   Near or Above                    Optimal  130-159  mg/dL   Borderline  413-244160-189  mg/dL   High  >010>190     mg/dL   Very High      Wt Readings from Last 3 Encounters:  06/15/16 180 lb 9.6 oz (81.9 kg)  03/12/15 191 lb 12 oz (87 kg)  08/21/14 200 lb 14.4 oz (91.1 kg)    ASSESSMENT AND PLAN: 1.  CAD, native vessel, without symptoms of angina: stable on current medical program. Taking ASA, atorvastatin, carvedilol, and losartan.  2. Essential hypertension: BP controlled on current Rx with carvedilol and losartan. Pt has done a good job with lifestyle modification.   3. Hyperlipidemia: taking atorvastatin. Followed by Dr Chilton SiGreen.  Current medicines are reviewed with the patient today.  The patient does not have concerns regarding medicines.  Labs/ tests ordered today include:   Orders Placed This Encounter  Procedures  . EKG 12-Lead    Disposition:   FU one year  Signed, Tonny Bollmanooper, Macallan Ord, MD  06/15/2016 5:11 PM    [facility] [address], [place], [place]  [id number] Phone: [phone]; Fax: [phone]) [phone]

## 2016-06-15 NOTE — Patient Instructions (Signed)
Continue same medications.   Your physician wants you to follow-up in: 12 months.  You will receive a reminder letter in the mail two months in advance. If you don't receive a letter, please call our office to schedule the follow-up appointment.  

## 2016-08-19 DIAGNOSIS — I10 Essential (primary) hypertension: Secondary | ICD-10-CM | POA: Diagnosis not present

## 2016-08-19 DIAGNOSIS — M1000 Idiopathic gout, unspecified site: Secondary | ICD-10-CM | POA: Diagnosis not present

## 2016-08-19 DIAGNOSIS — A0472 Enterocolitis due to Clostridium difficile, not specified as recurrent: Secondary | ICD-10-CM | POA: Diagnosis not present

## 2016-08-19 DIAGNOSIS — E1165 Type 2 diabetes mellitus with hyperglycemia: Secondary | ICD-10-CM | POA: Diagnosis not present

## 2017-02-11 DIAGNOSIS — E119 Type 2 diabetes mellitus without complications: Secondary | ICD-10-CM | POA: Diagnosis not present

## 2017-02-11 DIAGNOSIS — Z0000 Encounter for general adult medical examination without abnormal findings: Secondary | ICD-10-CM | POA: Diagnosis not present

## 2017-02-11 DIAGNOSIS — I2510 Atherosclerotic heart disease of native coronary artery without angina pectoris: Secondary | ICD-10-CM | POA: Diagnosis not present

## 2017-02-11 DIAGNOSIS — E790 Hyperuricemia without signs of inflammatory arthritis and tophaceous disease: Secondary | ICD-10-CM | POA: Diagnosis not present

## 2017-02-11 DIAGNOSIS — M109 Gout, unspecified: Secondary | ICD-10-CM | POA: Diagnosis not present

## 2017-03-16 DIAGNOSIS — E119 Type 2 diabetes mellitus without complications: Secondary | ICD-10-CM | POA: Diagnosis not present

## 2017-03-16 DIAGNOSIS — I2510 Atherosclerotic heart disease of native coronary artery without angina pectoris: Secondary | ICD-10-CM | POA: Diagnosis not present

## 2017-03-16 DIAGNOSIS — C4490 Unspecified malignant neoplasm of skin, unspecified: Secondary | ICD-10-CM | POA: Diagnosis not present

## 2017-03-16 DIAGNOSIS — M1000 Idiopathic gout, unspecified site: Secondary | ICD-10-CM | POA: Diagnosis not present

## 2017-05-11 DIAGNOSIS — Z1231 Encounter for screening mammogram for malignant neoplasm of breast: Secondary | ICD-10-CM | POA: Diagnosis not present

## 2017-07-19 DIAGNOSIS — I10 Essential (primary) hypertension: Secondary | ICD-10-CM | POA: Diagnosis not present

## 2017-07-19 DIAGNOSIS — M109 Gout, unspecified: Secondary | ICD-10-CM | POA: Diagnosis not present

## 2017-07-19 DIAGNOSIS — I2510 Atherosclerotic heart disease of native coronary artery without angina pectoris: Secondary | ICD-10-CM | POA: Diagnosis not present

## 2017-07-19 DIAGNOSIS — E119 Type 2 diabetes mellitus without complications: Secondary | ICD-10-CM | POA: Diagnosis not present

## 2017-11-23 DIAGNOSIS — I2510 Atherosclerotic heart disease of native coronary artery without angina pectoris: Secondary | ICD-10-CM | POA: Diagnosis not present

## 2017-11-23 DIAGNOSIS — I10 Essential (primary) hypertension: Secondary | ICD-10-CM | POA: Diagnosis not present

## 2017-11-23 DIAGNOSIS — C4490 Unspecified malignant neoplasm of skin, unspecified: Secondary | ICD-10-CM | POA: Diagnosis not present

## 2018-01-12 DIAGNOSIS — M25552 Pain in left hip: Secondary | ICD-10-CM | POA: Diagnosis not present

## 2018-01-12 DIAGNOSIS — E118 Type 2 diabetes mellitus with unspecified complications: Secondary | ICD-10-CM | POA: Diagnosis not present

## 2018-01-12 DIAGNOSIS — C4490 Unspecified malignant neoplasm of skin, unspecified: Secondary | ICD-10-CM | POA: Diagnosis not present

## 2018-01-12 DIAGNOSIS — I10 Essential (primary) hypertension: Secondary | ICD-10-CM | POA: Diagnosis not present

## 2018-01-27 DIAGNOSIS — I2510 Atherosclerotic heart disease of native coronary artery without angina pectoris: Secondary | ICD-10-CM | POA: Diagnosis not present

## 2018-01-27 DIAGNOSIS — E119 Type 2 diabetes mellitus without complications: Secondary | ICD-10-CM | POA: Diagnosis not present

## 2018-01-27 DIAGNOSIS — D0439 Carcinoma in situ of skin of other parts of face: Secondary | ICD-10-CM | POA: Diagnosis not present

## 2018-01-27 DIAGNOSIS — C44309 Unspecified malignant neoplasm of skin of other parts of face: Secondary | ICD-10-CM | POA: Diagnosis not present

## 2018-03-03 DIAGNOSIS — C4492 Squamous cell carcinoma of skin, unspecified: Secondary | ICD-10-CM | POA: Diagnosis not present

## 2018-03-03 DIAGNOSIS — E118 Type 2 diabetes mellitus with unspecified complications: Secondary | ICD-10-CM | POA: Diagnosis not present

## 2018-04-14 DIAGNOSIS — I10 Essential (primary) hypertension: Secondary | ICD-10-CM | POA: Diagnosis not present

## 2018-04-14 DIAGNOSIS — Z23 Encounter for immunization: Secondary | ICD-10-CM | POA: Diagnosis not present

## 2018-04-14 DIAGNOSIS — C4490 Unspecified malignant neoplasm of skin, unspecified: Secondary | ICD-10-CM | POA: Diagnosis not present

## 2018-04-14 DIAGNOSIS — E118 Type 2 diabetes mellitus with unspecified complications: Secondary | ICD-10-CM | POA: Diagnosis not present

## 2018-05-17 DIAGNOSIS — E118 Type 2 diabetes mellitus with unspecified complications: Secondary | ICD-10-CM | POA: Diagnosis not present

## 2018-05-17 DIAGNOSIS — C4492 Squamous cell carcinoma of skin, unspecified: Secondary | ICD-10-CM | POA: Diagnosis not present

## 2018-07-19 DIAGNOSIS — I2510 Atherosclerotic heart disease of native coronary artery without angina pectoris: Secondary | ICD-10-CM | POA: Diagnosis not present

## 2018-07-19 DIAGNOSIS — E118 Type 2 diabetes mellitus with unspecified complications: Secondary | ICD-10-CM | POA: Diagnosis not present

## 2018-07-19 DIAGNOSIS — I10 Essential (primary) hypertension: Secondary | ICD-10-CM | POA: Diagnosis not present

## 2018-07-19 DIAGNOSIS — M109 Gout, unspecified: Secondary | ICD-10-CM | POA: Diagnosis not present

## 2018-07-19 DIAGNOSIS — R799 Abnormal finding of blood chemistry, unspecified: Secondary | ICD-10-CM | POA: Diagnosis not present

## 2018-07-28 DIAGNOSIS — R799 Abnormal finding of blood chemistry, unspecified: Secondary | ICD-10-CM | POA: Diagnosis not present

## 2018-07-28 DIAGNOSIS — E559 Vitamin D deficiency, unspecified: Secondary | ICD-10-CM | POA: Diagnosis not present

## 2019-04-18 DIAGNOSIS — Z23 Encounter for immunization: Secondary | ICD-10-CM | POA: Diagnosis not present

## 2019-05-26 DIAGNOSIS — B029 Zoster without complications: Secondary | ICD-10-CM | POA: Diagnosis not present

## 2019-05-29 DIAGNOSIS — G501 Atypical facial pain: Secondary | ICD-10-CM | POA: Diagnosis not present

## 2019-08-24 DIAGNOSIS — I2510 Atherosclerotic heart disease of native coronary artery without angina pectoris: Secondary | ICD-10-CM | POA: Diagnosis not present

## 2019-08-24 DIAGNOSIS — E611 Iron deficiency: Secondary | ICD-10-CM | POA: Diagnosis not present

## 2019-08-24 DIAGNOSIS — I10 Essential (primary) hypertension: Secondary | ICD-10-CM | POA: Diagnosis not present

## 2019-08-24 DIAGNOSIS — R82998 Other abnormal findings in urine: Secondary | ICD-10-CM | POA: Diagnosis not present

## 2019-08-24 DIAGNOSIS — E1169 Type 2 diabetes mellitus with other specified complication: Secondary | ICD-10-CM | POA: Diagnosis not present

## 2019-08-24 DIAGNOSIS — M109 Gout, unspecified: Secondary | ICD-10-CM | POA: Diagnosis not present

## 2019-09-14 ENCOUNTER — Ambulatory Visit: Payer: Medicare Other | Attending: Internal Medicine

## 2019-09-14 DIAGNOSIS — Z23 Encounter for immunization: Secondary | ICD-10-CM | POA: Insufficient documentation

## 2019-09-14 NOTE — Progress Notes (Signed)
   Covid-19 Vaccination Clinic  Name:  [name]    MRN: [id number] DOB: 21-Mar-1931  09/14/2019  [name] was observed post Covid-19 immunization for 15 minutes without incident. She was provided with Vaccine Information Sheet and instruction to access the V-Safe system.   [name] was instructed to call 911 with any severe reactions post vaccine: Marland Kitchen Difficulty breathing  . Swelling of face and throat  . A fast heartbeat  . A bad rash all over body  . Dizziness and weakness   Immunizations Administered    Name Date Dose VIS Date Route   Pfizer COVID-19 Vaccine 09/14/2019  3:56 PM 0.3 mL 06/23/2019 Intramuscular   Manufacturer: [employer], [employer]   Lot: [id number]   NDC: [address]-7

## 2019-09-25 ENCOUNTER — Emergency Department (HOSPITAL_COMMUNITY): Payer: Medicare Other

## 2019-09-25 ENCOUNTER — Other Ambulatory Visit: Payer: Self-pay

## 2019-09-25 ENCOUNTER — Emergency Department (HOSPITAL_COMMUNITY)
Admission: EM | Admit: 2019-09-25 | Discharge: 2019-09-26 | Disposition: A | Discharge status: Home / Self Care | Payer: Medicare Other | Attending: Emergency Medicine | Admitting: Emergency Medicine

## 2019-09-25 ENCOUNTER — Encounter (HOSPITAL_COMMUNITY): Payer: Self-pay

## 2019-09-25 DIAGNOSIS — I491 Atrial premature depolarization: Secondary | ICD-10-CM | POA: Diagnosis not present

## 2019-09-25 DIAGNOSIS — R52 Pain, unspecified: Secondary | ICD-10-CM | POA: Diagnosis not present

## 2019-09-25 DIAGNOSIS — I2510 Atherosclerotic heart disease of native coronary artery without angina pectoris: Secondary | ICD-10-CM | POA: Insufficient documentation

## 2019-09-25 DIAGNOSIS — Z79899 Other long term (current) drug therapy: Secondary | ICD-10-CM | POA: Diagnosis not present

## 2019-09-25 DIAGNOSIS — Z7982 Long term (current) use of aspirin: Secondary | ICD-10-CM | POA: Diagnosis not present

## 2019-09-25 DIAGNOSIS — R109 Unspecified abdominal pain: Secondary | ICD-10-CM | POA: Diagnosis not present

## 2019-09-25 DIAGNOSIS — N3000 Acute cystitis without hematuria: Secondary | ICD-10-CM

## 2019-09-25 DIAGNOSIS — R1084 Generalized abdominal pain: Secondary | ICD-10-CM | POA: Diagnosis not present

## 2019-09-25 DIAGNOSIS — Z955 Presence of coronary angioplasty implant and graft: Secondary | ICD-10-CM | POA: Diagnosis not present

## 2019-09-25 DIAGNOSIS — I10 Essential (primary) hypertension: Secondary | ICD-10-CM | POA: Diagnosis not present

## 2019-09-25 DIAGNOSIS — M545 Low back pain: Secondary | ICD-10-CM | POA: Diagnosis not present

## 2019-09-25 DIAGNOSIS — E119 Type 2 diabetes mellitus without complications: Secondary | ICD-10-CM | POA: Diagnosis not present

## 2019-09-25 LAB — COMPREHENSIVE METABOLIC PANEL
ALT: 11 U/L (ref 0–44)
AST: 17 U/L (ref 15–41)
Albumin: 3.9 g/dL (ref 3.5–5.0)
Alkaline Phosphatase: 96 U/L (ref 38–126)
Anion gap: 13 (ref 5–15)
BUN: 53 mg/dL — ABNORMAL HIGH (ref 8–23)
CO2: 20 mmol/L — ABNORMAL LOW (ref 22–32)
Calcium: 8.7 mg/dL — ABNORMAL LOW (ref 8.9–10.3)
Chloride: 110 mmol/L (ref 98–111)
Creatinine, Ser: 2.17 mg/dL — ABNORMAL HIGH (ref 0.44–1.00)
GFR calc Af Amer: 23 mL/min — ABNORMAL LOW (ref >60)
GFR calc non Af Amer: 20 mL/min — ABNORMAL LOW (ref >60)
Glucose, Bld: 144 mg/dL — ABNORMAL HIGH (ref 70–99)
Potassium: 4.8 mmol/L (ref 3.5–5.1)
Sodium: 143 mmol/L (ref 135–145)
Total Bilirubin: 0.7 mg/dL (ref 0.3–1.2)
Total Protein: 6.6 g/dL (ref 6.5–8.1)

## 2019-09-25 LAB — URINALYSIS, ROUTINE W REFLEX MICROSCOPIC
Bilirubin Urine: NEGATIVE
Glucose, UA: NEGATIVE mg/dL
Hgb urine dipstick: NEGATIVE
Ketones, ur: NEGATIVE mg/dL
Nitrite: NEGATIVE
Protein, ur: NEGATIVE mg/dL
Specific Gravity, Urine: 1.015 (ref 1.005–1.030)
WBC, UA: >50 WBC/hpf — ABNORMAL HIGH (ref 0–5)
pH: 5 (ref 5.0–8.0)

## 2019-09-25 LAB — CBC
HCT: 44.1 % (ref 36.0–46.0)
Hemoglobin: 12.7 g/dL (ref 12.0–15.0)
MCH: 34.2 pg — ABNORMAL HIGH (ref 26.0–34.0)
MCHC: 28.8 g/dL — ABNORMAL LOW (ref 30.0–36.0)
MCV: 118.9 fL — ABNORMAL HIGH (ref 80.0–100.0)
Platelets: 131 10*3/uL — ABNORMAL LOW (ref 150–400)
RBC: 3.71 MIL/uL — ABNORMAL LOW (ref 3.87–5.11)
RDW: 12.6 % (ref 11.5–15.5)
WBC: 8.6 10*3/uL (ref 4.0–10.5)
nRBC: 0 % (ref 0.0–0.2)

## 2019-09-25 LAB — LIPASE, BLOOD: Lipase: 45 U/L (ref 11–51)

## 2019-09-25 MED ORDER — CIPROFLOXACIN HCL 500 MG PO TABS: 500.0000 mg | ORAL_TABLET | Freq: Two times a day (BID) | ORAL | 0 refills | Status: DC

## 2019-09-25 MED ORDER — SODIUM CHLORIDE 0.9 % IV BOLUS
500.0000 mL | Freq: Once | INTRAVENOUS | Status: AC
  Administered 2019-09-25: 500 mL via INTRAVENOUS

## 2019-09-25 MED ORDER — CIPROFLOXACIN HCL 500 MG PO TABS
500.0000 mg | ORAL_TABLET | Freq: Once | ORAL | Status: AC
  Administered 2019-09-26: 500 mg via ORAL
  Filled 2019-09-25: qty 1

## 2019-09-25 NOTE — ED Notes (Signed)
Pt assisted to BR with steady. Pt attempting to provide urine sample at this time.

## 2019-09-25 NOTE — ED Triage Notes (Signed)
Right flank pain, upper abdominal pain and uncontrolled diarrhea with hx of c.diff.  Lives with husband at home.   Vs:  Hr 104  rr 20 spo2 - 98%  bp 184/113  20g Lac - NS given pta

## 2019-09-25 NOTE — ED Notes (Signed)
Pt transported to CT ?

## 2019-09-25 NOTE — ED Notes (Signed)
Spoke to daughter regarding pt update. Request to call back when dispo determined

## 2019-09-25 NOTE — ED Notes (Signed)
Pt educated of need to provide stool sample stated she will let us know when she has to have a bowel movement.

## 2019-09-25 NOTE — Discharge Instructions (Addendum)
Take Imodium for diarrhea and follow-up with your doctor later this week

## 2019-09-25 NOTE — ED Notes (Signed)
Pt assisted back into bed with the steady and repositioned. Pt has call bell and phone within reach. Estell Harpin, MD at bedside.

## 2019-09-25 NOTE — ED Notes (Signed)
Daughter called to inform on pending discharge, reports son will pick up patient.

## 2019-09-26 NOTE — ED Provider Notes (Signed)
[facility]-EMERGENCY DEPT Provider Note   CSN: [id number] Arrival date & time: 09/25/19  1946     History Chief Complaint  Patient presents with  . Flank Pain    Jaselyn Nahm is a 86 y.o. female.  Patient complains of diarrhea and mild abdominal discomfort.  The history is provided by the patient. No language interpreter was used.  Diarrhea Quality:  Semi-solid Severity:  Mild Onset quality:  Sudden Timing:  Constant Progression:  Improving Relieved by:  Nothing Worsened by:  Nothing Ineffective treatments:  None tried Associated symptoms: abdominal pain   Associated symptoms: no headaches        Past Medical History:  Diagnosis Date  . Chest pain   . Coronary atherosclerosis of native coronary artery    NSTEMI 11/10, LCx stent  . Diabetes mellitus    type II  . Gout   . Hypertension   . Low back pain    chronic  . Obesity     Patient Active Problem List   Diagnosis Date Noted  . Enteritis due to Clostridium difficile 08/18/2014  . Hypomagnesemia 08/18/2014  . Diarrhea 08/17/2014  . Dehydration 08/17/2014  . HYPERLIPIDEMIA-MIXED 04/07/2010  . CARDIOMYOPATHY, ISCHEMIC 09/27/2009  . DIABETES MELLITUS, TYPE II 06/26/2009  . GOUT, UNSPECIFIED 06/26/2009  . Obesity 06/26/2009  . LOW BACK PAIN, CHRONIC 06/26/2009  . HYPERTENSION, BENIGN 06/04/2009  . CORONARY ATHEROSCLEROSIS NATIVE CORONARY ARTERY 06/04/2009  . CHEST PAIN, PRECORDIAL 06/04/2009    Past Surgical History:  Procedure Laterality Date  . APPENDECTOMY    . CORONARY STENT PLACEMENT       OB History   No obstetric history on file.     Family History  Problem Relation Age of Onset  . Stroke Mother 68       deceased  . Heart attack Father 41       deceased  . Other Other        no CAD in siblings (3 brothers and 1 sister)    Social History   Tobacco Use  . Smoking status: Never Smoker  . Smokeless tobacco: Never Used  Substance Use Topics  . Alcohol use:  No  . Drug use: No    Home Medications Prior to Admission medications   Medication Sig Start Date End Date Taking? Authorizing Provider  aspirin 81 MG tablet Take 81 mg by mouth daily.   Yes [provider]  atorvastatin (LIPITOR) 80 MG tablet Take 80 mg by mouth at bedtime.    Yes [provider]  carvedilol (COREG) 12.5 MG tablet Take 12.5 mg by mouth 2 (two) times daily. 08/14/14  Yes [provider]  febuxostat (ULORIC) 40 MG tablet Take 40 mg by mouth daily.   Yes [provider]  ferrous sulfate 325 (65 FE) MG tablet Take 325 mg by mouth at bedtime.    Yes [provider]  furosemide (LASIX) 40 MG tablet Take 40 mg by mouth daily. 07/30/19  Yes [provider]  losartan (COZAAR) 100 MG tablet Take 100 mg by mouth daily.  02/26/15  Yes [provider]  pantoprazole (PROTONIX) 40 MG tablet Take 40 mg by mouth daily. 07/08/19  Yes [provider]  Vitamin D, Ergocalciferol, (DRISDOL) 1.25 MG (50000 UNIT) CAPS capsule Take 50,000 Units by mouth once a week. 07/30/19  Yes [provider]  ACCU-CHEK AVIVA PLUS test strip  01/11/15   [provider]  ciprofloxacin (CIPRO) 500 MG tablet Take 1 tablet (  500 mg total) by mouth 2 (two) times daily. 09/25/19   Bethann Berkshire, MD  colchicine 0.6 MG tablet Take 1 tablet (0.6 mg total) by mouth daily. Patient not taking: Reported on 09/25/2019 08/21/14   Regalado, Jon Billings A, MD  furosemide (LASIX) 20 MG tablet Take 1 tablet (20 mg total) by mouth daily. Patient not taking: Reported on 09/25/2019 08/21/14   Regalado, Jon Billings A, MD  ipratropium (ATROVENT) 0.06 % nasal spray Place 2 sprays into both nostrils 4 (four) times daily. Patient not taking: Reported on 09/25/2019 07/04/14   Reuben Likes, MD  Lancets (ACCU-CHEK MULTICLIX) lancets Use as directed to check your blood sugar 01/11/15   [provider]  vancomycin (VANCOCIN) 50 mg/mL oral solution Take 2.5 mLs (125 mg  total) by mouth every 6 (six) hours. Patient not taking: Reported on 09/25/2019 08/20/14   Hartley Barefoot A, MD    Allergies    Allopurinol and Sulfonamide derivatives  Review of Systems   Review of Systems  Constitutional: Negative for appetite change and fatigue.  HENT: Negative for congestion, ear discharge and sinus pressure.   Eyes: Negative for discharge.  Respiratory: Negative for cough.   Cardiovascular: Negative for chest pain.  Gastrointestinal: Positive for abdominal pain and diarrhea.  Genitourinary: Negative for frequency and hematuria.  Musculoskeletal: Negative for back pain.  Skin: Negative for rash.  Neurological: Negative for seizures and headaches.  Psychiatric/Behavioral: Negative for hallucinations.    Physical Exam Updated Vital Signs BP 127/82   Pulse 90   Temp 98.1 F (36.7 C) (Oral)   Resp 20   Ht 5\' 4"  (1.626 m)   Wt 80 kg   SpO2 99%   BMI 30.27 kg/m   Physical Exam Vitals and nursing note reviewed.  Constitutional:      Appearance: She is well-developed.  HENT:     Head: Normocephalic.     Nose: Nose normal.  Eyes:     General: No scleral icterus.    Conjunctiva/sclera: Conjunctivae normal.  Neck:     Thyroid: No thyromegaly.  Cardiovascular:     Rate and Rhythm: Normal rate and regular rhythm.     Heart sounds: No murmur. No friction rub. No gallop.   Pulmonary:     Breath sounds: No stridor. No wheezing or rales.  Chest:     Chest wall: No tenderness.  Abdominal:     General: There is no distension.     Tenderness: There is no abdominal tenderness. There is no rebound.     Comments: Mild abdominal tenderness  Musculoskeletal:        General: Normal range of motion.     Cervical back: Neck supple.  Lymphadenopathy:     Cervical: No cervical adenopathy.  Skin:    Findings: No erythema or rash.  Neurological:     Mental Status: She is oriented to person, place, and time.     Motor: No abnormal muscle tone.     Coordination:  Coordination normal.  Psychiatric:        Behavior: Behavior normal.     ED Results / Procedures / Treatments   Labs (all labs ordered are listed, but only abnormal results are displayed) Labs Reviewed  COMPREHENSIVE METABOLIC PANEL - Abnormal; Notable for the following components:      Result Value   CO2 20 (*)    Glucose, Bld 144 (*)    BUN 53 (*)    Creatinine, Ser 2.17 (*)    Calcium 8.7 (*)  GFR calc non Af Amer 20 (*)    GFR calc Af Amer 23 (*)    All other components within normal limits  CBC - Abnormal; Notable for the following components:   RBC 3.71 (*)    MCV 118.9 (*)    MCH 34.2 (*)    MCHC 28.8 (*)    Platelets 131 (*)    All other components within normal limits  URINALYSIS, ROUTINE W REFLEX MICROSCOPIC - Abnormal; Notable for the following components:   Leukocytes,Ua MODERATE (*)    WBC, UA >50 (*)    Bacteria, UA MANY (*)    All other components within normal limits  GI PATHOGEN PANEL BY PCR, STOOL  URINE CULTURE  LIPASE, BLOOD    EKG None  Radiology CT ABDOMEN PELVIS WO CONTRAST  Result Date: 09/25/2019 CLINICAL DATA:  Right flank pain. EXAM: CT ABDOMEN AND PELVIS WITHOUT CONTRAST TECHNIQUE: Multidetector CT imaging of the abdomen and pelvis was performed following the standard protocol without IV contrast. COMPARISON:  None. FINDINGS: Lower chest: No acute abnormality. Hepatobiliary: No focal liver abnormality is seen. Status post cholecystectomy. No biliary dilatation. Pancreas: Unremarkable. No pancreatic ductal dilatation or surrounding inflammatory changes. Spleen: Normal in size without focal abnormality. Adrenals/Urinary Tract: A 1.8 cm x 2.0 cm low-attenuation right adrenal mass is seen. A 1.3 cm x 0.9 cm low-attenuation left adrenal mass is also noted. The kidneys are normal in size with mild diffuse bilateral renal cortical thinning. A 3.0 cm x 3.2 cm lobulated isodense area is seen within the anteromedial aspect of the mid left kidney. There  is no evidence of renal calculi or hydronephrosis. Bladder is unremarkable. Stomach/Bowel: There is a small hiatal hernia. The appendix is not clearly identified. No evidence of bowel wall thickening, distention, or inflammatory changes. Vascular/Lymphatic: Marked severity aortic atherosclerosis. No enlarged abdominal or pelvic lymph nodes. Reproductive: Uterus and bilateral adnexa are unremarkable. Other: A 4.5 cm x 2.2 cm fat-containing ventral hernia is seen just above the level of the umbilicus. Musculoskeletal: Multilevel degenerative changes seen throughout the lumbar spine. IMPRESSION: 1. No evidence of urinary calculus or hydronephrosis. 2. 3.0 cm lobulated isodense area within the left kidney. Malignancy is not excluded. MRI with and without contrast is recommended. 3. Bilateral low-attenuation adrenal masses, likely benign. 4. 4.5 cm fat-containing ventral hernia just above the level of the umbilicus. Aortic Atherosclerosis (ICD10-I70.0). Electronically Signed   By: Aram Candela M.D.   On: 09/25/2019 22:04    Procedures Procedures (including critical care time)  Medications Ordered in ED Medications  sodium chloride 0.9 % bolus 500 mL (0 mLs Intravenous Stopped 09/25/19 2308)  ciprofloxacin (CIPRO) tablet 500 mg (500 mg Oral Given 09/26/19 0019)    ED Course  I have reviewed the triage vital signs and the nursing notes.  Pertinent labs & imaging results that were available during my care of the patient were reviewed by me and considered in my medical decision making (see chart for details).    MDM Rules/Calculators/A&P                     Labs show mild dehydration.  Patient has been given some IV fluids.  Also which is urinary tract infection.  CT scan does not show any acute GI problems.  Patient is put on Cipro for urinary tract infection told to take Imodium and will follow-up with her family doctor to discuss an outpatient MRI Final Clinical Impression(s) / ED Diagnoses Final  diagnoses:  Acute cystitis without hematuria    Rx / DC Orders ED Discharge Orders         Ordered    ciprofloxacin (CIPRO) 500 MG tablet  2 times daily     09/25/19 2252           Milton Ferguson, MD 09/26/19 1159

## 2019-09-26 NOTE — ED Notes (Signed)
Pt assisted into the car with family.

## 2019-09-26 NOTE — ED Notes (Signed)
Pt son and husband provided discharge instructions. Follow up with PCP this week. Educated on medication usage.

## 2019-09-27 LAB — URINE CULTURE: Culture: >=100000 — AB

## 2019-09-28 ENCOUNTER — Telehealth: Payer: Self-pay

## 2019-09-28 NOTE — Progress Notes (Signed)
ED Antimicrobial Stewardship Positive Culture Follow Up   [name] is an 86 y.o. female who presented to [facility] on 09/25/2019 with a chief complaint of  Chief Complaint  Patient presents with  . Flank Pain    Recent Results (from the past 720 hour(s))  Urine Culture     Status: Abnormal   Collection Time: 09/25/19  8:39 PM   Specimen: Urine, Random  Result Value Ref Range Status   Specimen Description URINE, RANDOM  Final   Special Requests   Final    NONE Performed at [facility], [address], [place], [place] [address]    Culture >=100,000 COLONIES/mL KLEBSIELLA OZAENAE (A)  Final   Report Status 09/27/2019 FINAL  Final   Organism ID, Bacteria KLEBSIELLA OZAENAE (A)  Final      Susceptibility   Klebsiella ozaenae - MIC*    AMPICILLIN RESISTANT Resistant     CEFAZOLIN <=4 SENSITIVE Sensitive     CEFTRIAXONE <=0.25 SENSITIVE Sensitive     CIPROFLOXACIN <=0.25 SENSITIVE Sensitive     GENTAMICIN <=1 SENSITIVE Sensitive     IMIPENEM <=0.25 SENSITIVE Sensitive     NITROFURANTOIN <=16 SENSITIVE Sensitive     TRIMETH/SULFA <=20 SENSITIVE Sensitive     AMPICILLIN/SULBACTAM <=2 SENSITIVE Sensitive     PIP/TAZO <=4 SENSITIVE Sensitive     * >=100,000 COLONIES/mL KLEBSIELLA OZAENAE     Presenting for diarrhea and R flank pain; no pyelo found on CT; UA+ for UTI  Treated for cystitis with Cipro 500 mg PO bid x 10 days  CrCl 19 ml/min with SCr acutely elevated > 2.0  Given renal impairment, and especially in setting of uncontrolled Cdiff, Cipro should be limited to 500 mg PO daily x 3 days  Plan: Call patient  If patient has taken at least 3 days of Cipro and no further urinary symptoms, should STOP Cipro  If still with urinary complaints, stop Cipro and return to clinic/ED  ED Provider: Michela Pitcher, PA-C   Rohaan Durnil A 09/28/2019, 10:51 AM Clinical Pharmacist [phone]

## 2019-09-28 NOTE — Telephone Encounter (Signed)
Spoke with pt for symptom check for [facility] ED 09/26/19  Pt instructed to take Cipro for only 3 days due to renal  CrCl per [facility].  Pt instructed to return to Ed or PCP with and further urinary problems  Stated understanding

## 2019-10-11 ENCOUNTER — Ambulatory Visit: Payer: Medicare Other | Attending: Internal Medicine

## 2019-10-11 DIAGNOSIS — Z23 Encounter for immunization: Secondary | ICD-10-CM

## 2019-10-11 NOTE — Progress Notes (Signed)
   Covid-19 Vaccination Clinic  Name:  [name]    MRN: [id number] DOB: 12/21/1930  10/11/2019  [name] was observed post Covid-19 immunization for 15 minutes without incident. She was provided with Vaccine Information Sheet and instruction to access the V-Safe system.   [name] was instructed to call 911 with any severe reactions post vaccine: Marland Kitchen Difficulty breathing  . Swelling of face and throat  . A fast heartbeat  . A bad rash all over body  . Dizziness and weakness   Immunizations Administered    Name Date Dose VIS Date Route   Pfizer COVID-19 Vaccine 10/11/2019 10:12 AM 0.3 mL 06/23/2019 Intramuscular   Manufacturer: [employer], [employer]   Lot: [id number]   NDC: [address]-1

## 2019-10-12 DIAGNOSIS — M545 Low back pain: Secondary | ICD-10-CM | POA: Diagnosis not present

## 2019-10-12 DIAGNOSIS — N184 Chronic kidney disease, stage 4 (severe): Secondary | ICD-10-CM | POA: Diagnosis not present

## 2019-10-12 DIAGNOSIS — I129 Hypertensive chronic kidney disease with stage 1 through stage 4 chronic kidney disease, or unspecified chronic kidney disease: Secondary | ICD-10-CM | POA: Diagnosis not present

## 2019-10-12 DIAGNOSIS — N2889 Other specified disorders of kidney and ureter: Secondary | ICD-10-CM | POA: Diagnosis not present

## 2019-10-19 DIAGNOSIS — N189 Chronic kidney disease, unspecified: Secondary | ICD-10-CM | POA: Diagnosis not present

## 2019-10-19 DIAGNOSIS — I2510 Atherosclerotic heart disease of native coronary artery without angina pectoris: Secondary | ICD-10-CM | POA: Diagnosis not present

## 2019-10-19 DIAGNOSIS — N184 Chronic kidney disease, stage 4 (severe): Secondary | ICD-10-CM | POA: Diagnosis not present

## 2019-10-19 DIAGNOSIS — E785 Hyperlipidemia, unspecified: Secondary | ICD-10-CM | POA: Diagnosis not present

## 2019-10-19 DIAGNOSIS — N2581 Secondary hyperparathyroidism of renal origin: Secondary | ICD-10-CM | POA: Diagnosis not present

## 2019-10-19 DIAGNOSIS — E1122 Type 2 diabetes mellitus with diabetic chronic kidney disease: Secondary | ICD-10-CM | POA: Diagnosis not present

## 2019-10-31 DIAGNOSIS — M545 Low back pain: Secondary | ICD-10-CM | POA: Diagnosis not present

## 2019-10-31 DIAGNOSIS — M25551 Pain in right hip: Secondary | ICD-10-CM | POA: Diagnosis not present

## 2019-11-17 DIAGNOSIS — E1122 Type 2 diabetes mellitus with diabetic chronic kidney disease: Secondary | ICD-10-CM | POA: Diagnosis not present

## 2019-11-17 DIAGNOSIS — N2581 Secondary hyperparathyroidism of renal origin: Secondary | ICD-10-CM | POA: Diagnosis not present

## 2019-11-17 DIAGNOSIS — N184 Chronic kidney disease, stage 4 (severe): Secondary | ICD-10-CM | POA: Diagnosis not present

## 2019-11-17 DIAGNOSIS — D631 Anemia in chronic kidney disease: Secondary | ICD-10-CM | POA: Diagnosis not present

## 2019-11-22 DIAGNOSIS — I129 Hypertensive chronic kidney disease with stage 1 through stage 4 chronic kidney disease, or unspecified chronic kidney disease: Secondary | ICD-10-CM | POA: Diagnosis not present

## 2019-11-22 DIAGNOSIS — G5701 Lesion of sciatic nerve, right lower limb: Secondary | ICD-10-CM | POA: Diagnosis not present

## 2019-11-22 DIAGNOSIS — M545 Low back pain: Secondary | ICD-10-CM | POA: Diagnosis not present

## 2019-11-22 DIAGNOSIS — G43B0 Ophthalmoplegic migraine, not intractable: Secondary | ICD-10-CM | POA: Diagnosis not present

## 2019-12-04 DIAGNOSIS — H5711 Ocular pain, right eye: Secondary | ICD-10-CM | POA: Diagnosis not present

## 2020-01-19 DIAGNOSIS — N184 Chronic kidney disease, stage 4 (severe): Secondary | ICD-10-CM | POA: Diagnosis not present

## 2020-01-19 DIAGNOSIS — R413 Other amnesia: Secondary | ICD-10-CM | POA: Diagnosis not present

## 2020-01-19 DIAGNOSIS — I129 Hypertensive chronic kidney disease with stage 1 through stage 4 chronic kidney disease, or unspecified chronic kidney disease: Secondary | ICD-10-CM | POA: Diagnosis not present

## 2020-01-19 DIAGNOSIS — N2889 Other specified disorders of kidney and ureter: Secondary | ICD-10-CM | POA: Diagnosis not present

## 2020-09-02 DIAGNOSIS — E611 Iron deficiency: Secondary | ICD-10-CM | POA: Diagnosis not present

## 2020-09-03 DIAGNOSIS — I10 Essential (primary) hypertension: Secondary | ICD-10-CM | POA: Diagnosis not present

## 2020-09-03 DIAGNOSIS — I129 Hypertensive chronic kidney disease with stage 1 through stage 4 chronic kidney disease, or unspecified chronic kidney disease: Secondary | ICD-10-CM | POA: Diagnosis not present

## 2020-09-03 DIAGNOSIS — E1169 Type 2 diabetes mellitus with other specified complication: Secondary | ICD-10-CM | POA: Diagnosis not present

## 2020-09-03 DIAGNOSIS — N184 Chronic kidney disease, stage 4 (severe): Secondary | ICD-10-CM | POA: Diagnosis not present

## 2020-09-09 DIAGNOSIS — E611 Iron deficiency: Secondary | ICD-10-CM | POA: Diagnosis not present

## 2020-09-09 DIAGNOSIS — R82998 Other abnormal findings in urine: Secondary | ICD-10-CM | POA: Diagnosis not present

## 2020-09-09 DIAGNOSIS — E1169 Type 2 diabetes mellitus with other specified complication: Secondary | ICD-10-CM | POA: Diagnosis not present

## 2020-09-09 DIAGNOSIS — G43B0 Ophthalmoplegic migraine, not intractable: Secondary | ICD-10-CM | POA: Diagnosis not present

## 2020-09-09 DIAGNOSIS — I10 Essential (primary) hypertension: Secondary | ICD-10-CM | POA: Diagnosis not present

## 2020-09-09 DIAGNOSIS — Z0000 Encounter for general adult medical examination without abnormal findings: Secondary | ICD-10-CM | POA: Diagnosis not present

## 2020-12-03 ENCOUNTER — Inpatient Hospital Stay (HOSPITAL_COMMUNITY)
Admission: EM | Admit: 2020-12-03 | Discharge: 2020-12-05 | DRG: 177 | Disposition: A | Discharge status: Home Health | Payer: Medicare Other | Source: Ambulatory Visit | Attending: Internal Medicine | Admitting: Internal Medicine

## 2020-12-03 ENCOUNTER — Emergency Department (HOSPITAL_COMMUNITY): Payer: Medicare Other

## 2020-12-03 ENCOUNTER — Other Ambulatory Visit: Payer: Self-pay

## 2020-12-03 ENCOUNTER — Encounter (HOSPITAL_COMMUNITY): Payer: Self-pay | Admitting: Family Medicine

## 2020-12-03 DIAGNOSIS — U071 COVID-19: Principal | ICD-10-CM | POA: Diagnosis present

## 2020-12-03 DIAGNOSIS — M109 Gout, unspecified: Secondary | ICD-10-CM | POA: Diagnosis not present

## 2020-12-03 DIAGNOSIS — I5032 Chronic diastolic (congestive) heart failure: Secondary | ICD-10-CM | POA: Diagnosis present

## 2020-12-03 DIAGNOSIS — Z79899 Other long term (current) drug therapy: Secondary | ICD-10-CM

## 2020-12-03 DIAGNOSIS — J9601 Acute respiratory failure with hypoxia: Secondary | ICD-10-CM | POA: Diagnosis not present

## 2020-12-03 DIAGNOSIS — I130 Hypertensive heart and chronic kidney disease with heart failure and stage 1 through stage 4 chronic kidney disease, or unspecified chronic kidney disease: Secondary | ICD-10-CM | POA: Diagnosis present

## 2020-12-03 DIAGNOSIS — J189 Pneumonia, unspecified organism: Secondary | ICD-10-CM | POA: Diagnosis present

## 2020-12-03 DIAGNOSIS — I2510 Atherosclerotic heart disease of native coronary artery without angina pectoris: Secondary | ICD-10-CM | POA: Diagnosis not present

## 2020-12-03 DIAGNOSIS — Z955 Presence of coronary angioplasty implant and graft: Secondary | ICD-10-CM | POA: Diagnosis not present

## 2020-12-03 DIAGNOSIS — Z882 Allergy status to sulfonamides status: Secondary | ICD-10-CM

## 2020-12-03 DIAGNOSIS — D509 Iron deficiency anemia, unspecified: Secondary | ICD-10-CM | POA: Diagnosis present

## 2020-12-03 DIAGNOSIS — R112 Nausea with vomiting, unspecified: Secondary | ICD-10-CM | POA: Diagnosis not present

## 2020-12-03 DIAGNOSIS — Z823 Family history of stroke: Secondary | ICD-10-CM | POA: Diagnosis not present

## 2020-12-03 DIAGNOSIS — Z8249 Family history of ischemic heart disease and other diseases of the circulatory system: Secondary | ICD-10-CM

## 2020-12-03 DIAGNOSIS — E1122 Type 2 diabetes mellitus with diabetic chronic kidney disease: Secondary | ICD-10-CM | POA: Diagnosis present

## 2020-12-03 DIAGNOSIS — E1169 Type 2 diabetes mellitus with other specified complication: Secondary | ICD-10-CM | POA: Diagnosis not present

## 2020-12-03 DIAGNOSIS — I252 Old myocardial infarction: Secondary | ICD-10-CM | POA: Diagnosis not present

## 2020-12-03 DIAGNOSIS — R000 Tachycardia, unspecified: Secondary | ICD-10-CM | POA: Diagnosis not present

## 2020-12-03 DIAGNOSIS — N1832 Chronic kidney disease, stage 3b: Secondary | ICD-10-CM | POA: Diagnosis present

## 2020-12-03 DIAGNOSIS — F0390 Unspecified dementia without behavioral disturbance: Secondary | ICD-10-CM | POA: Diagnosis not present

## 2020-12-03 DIAGNOSIS — Z7982 Long term (current) use of aspirin: Secondary | ICD-10-CM | POA: Diagnosis not present

## 2020-12-03 DIAGNOSIS — J1282 Pneumonia due to coronavirus disease 2019: Secondary | ICD-10-CM | POA: Diagnosis not present

## 2020-12-03 DIAGNOSIS — E669 Obesity, unspecified: Secondary | ICD-10-CM | POA: Diagnosis present

## 2020-12-03 DIAGNOSIS — J90 Pleural effusion, not elsewhere classified: Secondary | ICD-10-CM | POA: Diagnosis not present

## 2020-12-03 DIAGNOSIS — Z888 Allergy status to other drugs, medicaments and biological substances status: Secondary | ICD-10-CM | POA: Diagnosis not present

## 2020-12-03 LAB — BASIC METABOLIC PANEL
Anion gap: 10 (ref 5–15)
BUN: 28 mg/dL — ABNORMAL HIGH (ref 8–23)
CO2: 27 mmol/L (ref 22–32)
Calcium: 8.6 mg/dL — ABNORMAL LOW (ref 8.9–10.3)
Chloride: 107 mmol/L (ref 98–111)
Creatinine, Ser: 1.56 mg/dL — ABNORMAL HIGH (ref 0.44–1.00)
GFR, Estimated: 32 mL/min — ABNORMAL LOW (ref >60)
Glucose, Bld: 122 mg/dL — ABNORMAL HIGH (ref 70–99)
Potassium: 4.6 mmol/L (ref 3.5–5.1)
Sodium: 144 mmol/L (ref 135–145)

## 2020-12-03 LAB — CBC
HCT: 37.4 % (ref 36.0–46.0)
Hemoglobin: 11.3 g/dL — ABNORMAL LOW (ref 12.0–15.0)
MCH: 32.7 pg (ref 26.0–34.0)
MCHC: 30.2 g/dL (ref 30.0–36.0)
MCV: 108.1 fL — ABNORMAL HIGH (ref 80.0–100.0)
Platelets: 198 10*3/uL (ref 150–400)
RBC: 3.46 MIL/uL — ABNORMAL LOW (ref 3.87–5.11)
RDW: 13.6 % (ref 11.5–15.5)
WBC: 8.6 10*3/uL (ref 4.0–10.5)
nRBC: 0 % (ref 0.0–0.2)

## 2020-12-03 MED ORDER — SODIUM CHLORIDE 0.9 % IV BOLUS
500.0000 mL | Freq: Once | INTRAVENOUS | Status: AC
  Administered 2020-12-03: 500 mL via INTRAVENOUS

## 2020-12-03 MED ORDER — SODIUM CHLORIDE 0.9 % IV SOLN
1.0000 g | Freq: Once | INTRAVENOUS | Status: AC
  Administered 2020-12-03: 1 g via INTRAVENOUS
  Filled 2020-12-03: qty 10

## 2020-12-03 MED ORDER — SODIUM CHLORIDE 0.9 % IV SOLN
500.0000 mg | Freq: Once | INTRAVENOUS | Status: AC
  Administered 2020-12-04: 500 mg via INTRAVENOUS
  Filled 2020-12-03: qty 500

## 2020-12-03 MED ORDER — ONDANSETRON HCL 4 MG/2ML IJ SOLN
4.0000 mg | Freq: Once | INTRAMUSCULAR | Status: DC
  Filled 2020-12-03: qty 2

## 2020-12-03 NOTE — ED Provider Notes (Signed)
Emergency Medicine Provider Triage Evaluation Note  [name] , a 87 y.o. female  was evaluated in triage.  Pt states she was sent here by her doctor's office due to concern for pneumonia.  Review of Systems  Positive: Sent by Dr. For concern for pneumonia Negative: Chest pain, shortness of breath, cough, fever  Physical Exam  BP (!) 156/95 (BP Location: Left Arm)   Pulse (!) 122   Temp (!) 97.3 F (36.3 C) (Oral)   Resp 15   SpO2 97%  Gen:   Awake, no distress   Resp:  Normal effort  MSK:   Moves extremities without difficulty  Other:    Medical Decision Making  Medically screening exam initiated at 2:31 PM.  Appropriate orders placed.  [name] was informed that the remainder of the evaluation will be completed by another provider, this initial triage assessment does not replace that evaluation, and the importance of remaining in the ED until their evaluation is complete.     Laura Pancoast, PA-C 12/03/20 1432    Laura Laine, MD 12/05/20 1251

## 2020-12-03 NOTE — ED Provider Notes (Signed)
[facility] EMERGENCY DEPARTMENT Provider Note   CSN: [id number] Arrival date & time: 12/03/20  1251     History No chief complaint on file.   Pegah Segel is a 87 y.o. female.  Patient to ED for evaluation of cough, DOE x 3 days. No fever, no chest pain. She reports being seen by her doctor today (Perini) who advised coming to the emergency department for consideration of admission for pneumonia. Patient reports she was told she had "pneumonia in both lungs". No vomiting. Her appetite is poor but she and husband state this is not unusual for her. No dysuria, urinary frequency. No nausea, vomiting or diarrhea. No congestion, sore throat. She has received COVID vaccination x 2.   The history is provided by the patient. No language interpreter was used.       Past Medical History:  Diagnosis Date  . Chest pain   . Coronary atherosclerosis of native coronary artery    NSTEMI 11/10, LCx stent  . Diabetes mellitus    type II  . Gout   . Hypertension   . Low back pain    chronic  . Obesity     Patient Active Problem List   Diagnosis Date Noted  . Enteritis due to Clostridium difficile 08/18/2014  . Hypomagnesemia 08/18/2014  . Diarrhea 08/17/2014  . Dehydration 08/17/2014  . HYPERLIPIDEMIA-MIXED 04/07/2010  . CARDIOMYOPATHY, ISCHEMIC 09/27/2009  . DIABETES MELLITUS, TYPE II 06/26/2009  . GOUT, UNSPECIFIED 06/26/2009  . Obesity 06/26/2009  . LOW BACK PAIN, CHRONIC 06/26/2009  . HYPERTENSION, BENIGN 06/04/2009  . CORONARY ATHEROSCLEROSIS NATIVE CORONARY ARTERY 06/04/2009  . CHEST PAIN, PRECORDIAL 06/04/2009    Past Surgical History:  Procedure Laterality Date  . APPENDECTOMY    . CORONARY STENT PLACEMENT       OB History   No obstetric history on file.     Family History  Problem Relation Age of Onset  . Stroke Mother 78       deceased  . Heart attack Father 86       deceased  . Other Other        no CAD in siblings (3 brothers and 1  sister)    Social History   Tobacco Use  . Smoking status: Never Smoker  . Smokeless tobacco: Never Used  Substance Use Topics  . Alcohol use: No  . Drug use: No    Home Medications Prior to Admission medications   Medication Sig Start Date End Date Taking? Authorizing Provider  ACCU-CHEK AVIVA PLUS test strip  01/11/15   [provider]  aspirin 81 MG tablet Take 81 mg by mouth daily.    [provider]  atorvastatin (LIPITOR) 80 MG tablet Take 80 mg by mouth at bedtime.     [provider]  carvedilol (COREG) 12.5 MG tablet Take 12.5 mg by mouth 2 (two) times daily. 08/14/14   [provider]  ciprofloxacin (CIPRO) 500 MG tablet Take 1 tablet (500 mg total) by mouth 2 (two) times daily. 09/25/19   Bethann Berkshire, MD  colchicine 0.6 MG tablet Take 1 tablet (0.6 mg total) by mouth daily. Patient not taking: Reported on 09/25/2019 08/21/14   Regalado, Jon Billings A, MD  febuxostat (ULORIC) 40 MG tablet Take 40 mg by mouth daily.    [provider]  ferrous sulfate 325 (65 FE) MG tablet Take 325 mg by mouth at bedtime.     [provider]  furosemide (LASIX) 20 MG tablet  Take 1 tablet (20 mg total) by mouth daily. Patient not taking: Reported on 09/25/2019 08/21/14   Hartley Barefoot A, MD  furosemide (LASIX) 40 MG tablet Take 40 mg by mouth daily. 07/30/19   [provider]  ipratropium (ATROVENT) 0.06 % nasal spray Place 2 sprays into both nostrils 4 (four) times daily. Patient not taking: Reported on 09/25/2019 07/04/14   Reuben Likes, MD  Lancets (ACCU-CHEK MULTICLIX) lancets Use as directed to check your blood sugar 01/11/15   [provider]  losartan (COZAAR) 100 MG tablet Take 100 mg by mouth daily.  02/26/15   [provider]  pantoprazole (PROTONIX) 40 MG tablet Take 40 mg by mouth daily. 07/08/19   [provider]  vancomycin (VANCOCIN) 50 mg/mL oral solution Take 2.5 mLs (125 mg total) by mouth every 6  (six) hours. Patient not taking: Reported on 09/25/2019 08/20/14   Regalado, Jon Billings A, MD  Vitamin D, Ergocalciferol, (DRISDOL) 1.25 MG (50000 UNIT) CAPS capsule Take 50,000 Units by mouth once a week. 07/30/19   [provider]    Allergies    Allopurinol and Sulfonamide derivatives  Review of Systems   Review of Systems  Constitutional: Negative for chills and fever.  HENT: Negative.  Negative for congestion and sore throat.   Respiratory: Positive for cough and shortness of breath (Dyspnea on exertion). Negative for wheezing.   Cardiovascular: Negative.  Negative for chest pain.  Gastrointestinal: Negative.  Negative for abdominal pain, diarrhea, nausea and vomiting.  Genitourinary: Negative for dysuria.  Musculoskeletal: Negative.  Negative for myalgias.  Skin: Negative.   Neurological: Negative.  Negative for weakness.    Physical Exam Updated Vital Signs BP (!) 146/95 (BP Location: Right Arm)   Pulse 98   Temp 98.3 F (36.8 C) (Oral)   Resp 20   SpO2 96%   Physical Exam Constitutional:      General: She is not in acute distress.    Appearance: She is obese. She is not ill-appearing or toxic-appearing.  HENT:     Head: Normocephalic.     Mouth/Throat:     Mouth: Mucous membranes are dry.  Eyes:     Conjunctiva/sclera: Conjunctivae normal.  Cardiovascular:     Rate and Rhythm: Regular rhythm. Tachycardia present.     Heart sounds: No murmur heard.   Pulmonary:     Effort: Pulmonary effort is normal.     Breath sounds: No wheezing, rhonchi or rales.  Chest:     Chest wall: No tenderness.  Abdominal:     Palpations: Abdomen is soft.     Tenderness: There is no abdominal tenderness.  Musculoskeletal:        General: Normal range of motion.     Cervical back: Normal range of motion and neck supple.  Skin:    General: Skin is warm and dry.  Neurological:     Mental Status: She is alert and oriented to person, place, and time.     ED Results /  Procedures / Treatments   Labs (all labs ordered are listed, but only abnormal results are displayed) Labs Reviewed  CBC - Abnormal; Notable for the following components:      Result Value   RBC 3.46 (*)    Hemoglobin 11.3 (*)    MCV 108.1 (*)    All other components within normal limits  BASIC METABOLIC PANEL - Abnormal; Notable for the following components:   Glucose, Bld 122 (*)    BUN 28 (*)  Creatinine, Ser 1.56 (*)    Calcium 8.6 (*)    GFR, Estimated 32 (*)    All other components within normal limits  RESP PANEL BY RT-PCR (FLU A&B, COVID) [id number]  LACTIC ACID, PLASMA  LACTIC ACID, PLASMA  D-DIMER, QUANTITATIVE    EKG None  Radiology DG Chest 2 View  Result Date: 12/03/2020 CLINICAL DATA:  Pneumonia. EXAM: CHEST - 2 VIEW COMPARISON:  Nov 18, 2010. FINDINGS: The heart size and mediastinal contours are within normal limits. No pneumothorax is noted. Right lung is clear. Mild left basilar atelectasis or infiltrate is noted with associated small left pleural effusion. The visualized skeletal structures are unremarkable. IMPRESSION: Mild left basilar atelectasis or infiltrate is noted with associated small left pleural effusion. Electronically Signed   By: Lupita Raider M.D.   On: 12/03/2020 15:18    Procedures Procedures   Medications Ordered in ED Medications  sodium chloride 0.9 % bolus 500 mL (has no administration in time range)    ED Course  I have reviewed the triage vital signs and the nursing notes.  Pertinent labs & imaging results that were available during my care of the patient were reviewed by me and considered in my medical decision making (see chart for details).    MDM Rules/Calculators/A&P                          Patient to ED from her PCP office recommending admission for "pneumonia is both lungs".   Patient care delayed due to department census and 9.5 hour wait to be seen.   Here the patient is tachcardic in the low 100's which increases  with conversation or movement, ie sitting up on exam. O2 saturations 92-93%. No fever. She appears comfortable. CXR shows basilar atx vs infiltrate. D-dimer considered.  Records from PCP visit today were not available initially but found by husband and reviewed which shows CXR with bibasilar infiltrates with L>R pl. Effusion. O2 sat in office documented as 93% at rest, 90% with ambulation. Tachypneic and tachycardic in office.   IV antibiotics started for CAP. Discussed with the hospitalist who accepts the patient for admission.   Final Clinical Impression(s) / ED Diagnoses Final diagnoses:  None   1. CAP   Rx / DC Orders ED Discharge Orders    None       Elpidio Anis, Cordelia Poche 12/03/20 2344    Tegeler, Canary Brim, MD 12/04/20 2230

## 2020-12-03 NOTE — ED Notes (Signed)
Pt reports being sent here by dr due to possible pneumonia.Pt denies any cp,n,v,diarhea or any other complaints

## 2020-12-03 NOTE — ED Notes (Signed)
Call daughter [name] for update

## 2020-12-03 NOTE — ED Triage Notes (Signed)
Pt sent by Dr. Isidore Moos for evaluation of possible pneumonia. Denies CP, SOB, COUGH or FEVER.

## 2020-12-04 DIAGNOSIS — Z8249 Family history of ischemic heart disease and other diseases of the circulatory system: Secondary | ICD-10-CM | POA: Diagnosis not present

## 2020-12-04 DIAGNOSIS — J9601 Acute respiratory failure with hypoxia: Secondary | ICD-10-CM | POA: Diagnosis present

## 2020-12-04 DIAGNOSIS — Z882 Allergy status to sulfonamides status: Secondary | ICD-10-CM | POA: Diagnosis not present

## 2020-12-04 DIAGNOSIS — Z955 Presence of coronary angioplasty implant and graft: Secondary | ICD-10-CM | POA: Diagnosis not present

## 2020-12-04 DIAGNOSIS — Z7982 Long term (current) use of aspirin: Secondary | ICD-10-CM | POA: Diagnosis not present

## 2020-12-04 DIAGNOSIS — I252 Old myocardial infarction: Secondary | ICD-10-CM | POA: Diagnosis not present

## 2020-12-04 DIAGNOSIS — E1122 Type 2 diabetes mellitus with diabetic chronic kidney disease: Secondary | ICD-10-CM | POA: Diagnosis present

## 2020-12-04 DIAGNOSIS — E669 Obesity, unspecified: Secondary | ICD-10-CM | POA: Diagnosis present

## 2020-12-04 DIAGNOSIS — I5032 Chronic diastolic (congestive) heart failure: Secondary | ICD-10-CM

## 2020-12-04 DIAGNOSIS — J1282 Pneumonia due to coronavirus disease 2019: Secondary | ICD-10-CM | POA: Diagnosis present

## 2020-12-04 DIAGNOSIS — I130 Hypertensive heart and chronic kidney disease with heart failure and stage 1 through stage 4 chronic kidney disease, or unspecified chronic kidney disease: Secondary | ICD-10-CM | POA: Diagnosis present

## 2020-12-04 DIAGNOSIS — D509 Iron deficiency anemia, unspecified: Secondary | ICD-10-CM | POA: Diagnosis present

## 2020-12-04 DIAGNOSIS — I2510 Atherosclerotic heart disease of native coronary artery without angina pectoris: Secondary | ICD-10-CM | POA: Diagnosis present

## 2020-12-04 DIAGNOSIS — Z888 Allergy status to other drugs, medicaments and biological substances status: Secondary | ICD-10-CM | POA: Diagnosis not present

## 2020-12-04 DIAGNOSIS — F0390 Unspecified dementia without behavioral disturbance: Secondary | ICD-10-CM | POA: Diagnosis present

## 2020-12-04 DIAGNOSIS — U071 COVID-19: Secondary | ICD-10-CM | POA: Diagnosis present

## 2020-12-04 DIAGNOSIS — J189 Pneumonia, unspecified organism: Secondary | ICD-10-CM | POA: Diagnosis present

## 2020-12-04 DIAGNOSIS — M109 Gout, unspecified: Secondary | ICD-10-CM | POA: Diagnosis present

## 2020-12-04 DIAGNOSIS — Z823 Family history of stroke: Secondary | ICD-10-CM | POA: Diagnosis not present

## 2020-12-04 DIAGNOSIS — N1832 Chronic kidney disease, stage 3b: Secondary | ICD-10-CM | POA: Diagnosis present

## 2020-12-04 DIAGNOSIS — Z79899 Other long term (current) drug therapy: Secondary | ICD-10-CM | POA: Diagnosis not present

## 2020-12-04 LAB — PROCALCITONIN
Procalcitonin: <0.1 ng/mL
Procalcitonin: <0.1 ng/mL

## 2020-12-04 LAB — RESP PANEL BY RT-PCR (FLU A&B, COVID) ARPGX2
Influenza A by PCR: NEGATIVE
Influenza B by PCR: NEGATIVE
SARS Coronavirus 2 by RT PCR: POSITIVE — AB

## 2020-12-04 LAB — BRAIN NATRIURETIC PEPTIDE: B Natriuretic Peptide: 2011.1 pg/mL — ABNORMAL HIGH (ref 0.0–100.0)

## 2020-12-04 LAB — LACTIC ACID, PLASMA: Lactic Acid, Venous: 1.8 mmol/L (ref 0.5–1.9)

## 2020-12-04 LAB — MAGNESIUM: Magnesium: 1.3 mg/dL — ABNORMAL LOW (ref 1.7–2.4)

## 2020-12-04 LAB — TSH: TSH: 3.044 u[IU]/mL (ref 0.350–4.500)

## 2020-12-04 MED ORDER — SODIUM CHLORIDE 0.9 % IV SOLN: 250.0000 mL | INTRAVENOUS | Status: DC | PRN

## 2020-12-04 MED ORDER — ATORVASTATIN CALCIUM 80 MG PO TABS
80.0000 mg | ORAL_TABLET | Freq: Every day | ORAL | Status: DC
  Administered 2020-12-04: 80 mg via ORAL
  Filled 2020-12-04: qty 1

## 2020-12-04 MED ORDER — ASPIRIN 81 MG PO CHEW
81.0000 mg | CHEWABLE_TABLET | Freq: Every day | ORAL | Status: DC
  Administered 2020-12-04 – 2020-12-05 (×2): 81 mg via ORAL
  Filled 2020-12-04 (×2): qty 1

## 2020-12-04 MED ORDER — HEPARIN SODIUM (PORCINE) 5000 UNIT/ML IJ SOLN
5000.0000 [IU] | Freq: Three times a day (TID) | INTRAMUSCULAR | Status: DC
  Administered 2020-12-04 – 2020-12-05 (×5): 5000 [IU] via SUBCUTANEOUS
  Filled 2020-12-04 (×5): qty 1

## 2020-12-04 MED ORDER — PREDNISONE 5 MG PO TABS: 50.0000 mg | ORAL_TABLET | Freq: Every day | ORAL | Status: DC

## 2020-12-04 MED ORDER — ACETAMINOPHEN 325 MG PO TABS: 650.0000 mg | ORAL_TABLET | Freq: Four times a day (QID) | ORAL | Status: DC | PRN

## 2020-12-04 MED ORDER — GUAIFENESIN-DM 100-10 MG/5ML PO SYRP: 10.0000 mL | ORAL_SOLUTION | ORAL | Status: DC | PRN

## 2020-12-04 MED ORDER — METOPROLOL TARTRATE 5 MG/5ML IV SOLN: 5.0000 mg | INTRAVENOUS | Status: DC | PRN

## 2020-12-04 MED ORDER — METHYLPREDNISOLONE SODIUM SUCC 125 MG IJ SOLR
0.5000 mg/kg | Freq: Two times a day (BID) | INTRAMUSCULAR | Status: DC
  Administered 2020-12-04 – 2020-12-05 (×4): 41.25 mg via INTRAVENOUS
  Filled 2020-12-04 (×4): qty 2

## 2020-12-04 MED ORDER — ACETAMINOPHEN 650 MG RE SUPP: 650.0000 mg | Freq: Four times a day (QID) | RECTAL | Status: DC | PRN

## 2020-12-04 MED ORDER — SENNOSIDES-DOCUSATE SODIUM 8.6-50 MG PO TABS: 1.0000 | ORAL_TABLET | Freq: Every evening | ORAL | Status: DC | PRN

## 2020-12-04 MED ORDER — ONDANSETRON HCL 4 MG PO TABS: 4.0000 mg | ORAL_TABLET | Freq: Four times a day (QID) | ORAL | Status: DC | PRN

## 2020-12-04 MED ORDER — SODIUM CHLORIDE 0.9 % IV SOLN
500.0000 mg | INTRAVENOUS | Status: DC
  Filled 2020-12-04: qty 500

## 2020-12-04 MED ORDER — MAGNESIUM SULFATE 2 GM/50ML IV SOLN
2.0000 g | Freq: Two times a day (BID) | INTRAVENOUS | Status: AC
  Administered 2020-12-04 (×2): 2 g via INTRAVENOUS
  Filled 2020-12-04 (×2): qty 50

## 2020-12-04 MED ORDER — LOSARTAN POTASSIUM 50 MG PO TABS
100.0000 mg | ORAL_TABLET | Freq: Every day | ORAL | Status: DC
  Administered 2020-12-04 – 2020-12-05 (×2): 100 mg via ORAL
  Filled 2020-12-04 (×2): qty 2

## 2020-12-04 MED ORDER — RIVASTIGMINE 4.6 MG/24HR TD PT24
4.6000 mg | MEDICATED_PATCH | Freq: Every day | TRANSDERMAL | Status: DC
  Administered 2020-12-04 – 2020-12-05 (×2): 4.6 mg via TRANSDERMAL
  Filled 2020-12-04 (×3): qty 1

## 2020-12-04 MED ORDER — SODIUM CHLORIDE 0.9 % IV SOLN
200.0000 mg | Freq: Once | INTRAVENOUS | Status: AC
  Administered 2020-12-04: 200 mg via INTRAVENOUS
  Filled 2020-12-04: qty 40

## 2020-12-04 MED ORDER — PANTOPRAZOLE SODIUM 40 MG PO TBEC
40.0000 mg | DELAYED_RELEASE_TABLET | Freq: Every day | ORAL | Status: DC
  Administered 2020-12-04 – 2020-12-05 (×2): 40 mg via ORAL
  Filled 2020-12-04 (×2): qty 1

## 2020-12-04 MED ORDER — FUROSEMIDE 40 MG PO TABS
40.0000 mg | ORAL_TABLET | Freq: Every day | ORAL | Status: DC
  Administered 2020-12-04 – 2020-12-05 (×2): 40 mg via ORAL
  Filled 2020-12-04 (×2): qty 1

## 2020-12-04 MED ORDER — SODIUM CHLORIDE 0.9 % IV SOLN: 2.0000 g | INTRAVENOUS | Status: DC

## 2020-12-04 MED ORDER — ONDANSETRON HCL 4 MG/2ML IJ SOLN
4.0000 mg | Freq: Four times a day (QID) | INTRAMUSCULAR | Status: DC | PRN
  Administered 2020-12-04: 4 mg via INTRAVENOUS
  Filled 2020-12-04: qty 2

## 2020-12-04 MED ORDER — SODIUM CHLORIDE 0.9 % IV SOLN
100.0000 mg | Freq: Every day | INTRAVENOUS | Status: DC
  Administered 2020-12-05: 100 mg via INTRAVENOUS
  Filled 2020-12-04: qty 20

## 2020-12-04 NOTE — ED Notes (Signed)
Pt refused blood work. MD opyd notified

## 2020-12-04 NOTE — ED Notes (Signed)
Md Opyd notified of pts hr jumping into the 120s

## 2020-12-04 NOTE — Progress Notes (Signed)
[name]  Code Status: FULL  Cachet Mccutchen is a 87 y.o. female patient admitted from ED awake, alert - oriented X2 - no acute distress noted. VSS -  no c/o shortness of breath, no c/o chest pain. Cardiac tele in place.   Fall assessment complete, with patient able to verbalize understanding of risk associated with falls, and verbalized understanding to call nursing before up out of bed. Call light within reach, patient able to voice, and demonstrate understanding. Skin, clean-dry- intact without evidence of bruising, or skin tears. Bottom red but blanchable. No evidence of skin break down noted on exam.  ?  Will cont to eval and treat per MD orders.  Jon Gills, RN  12/04/2020

## 2020-12-04 NOTE — Evaluation (Signed)
Physical Therapy Evaluation Patient Details Name: [name] MRN: [id number] DOB: Nov 11, 1930 Today's Date: 12/04/2020   History of Present Illness  87yo female admitted on 12/03/20 with c/o cough and SOB after being referred to the ED by her PCP due to tachycardia, tachypneia, and CXR concerning for pneumonia. Found to be covid positive and in acute hypoxic respiratory failure. PMH NSTEMI, DM, gout, HTN, LBP, CHF, dementia  Clinical Impression   Patient received in bed, lethargic but did respond to localized stimuli such as pinching nail beds and PT rolling her from side to side. Too lethargic to follow commands, but at one point did briefly become a bit more alert/awake and independently sat up into long sitting position before laying back down in bed. Left in bed with all needs met, bed alarm active. Will need some additional sessions to clarify PT reccs, however feel that once she is more alert she will likely mobilize well. Plan to check back tomorrow in hopes she'll be able to do a bit more.      Follow Up Recommendations Other (comment) (SNF versus HHPT- TBD based on progress)    Equipment Recommendations  Other (comment) (TBD)    Recommendations for Other Services       Precautions / Restrictions Precautions Precautions: Fall;Other (comment) Precaution Comments: covid + Restrictions Weight Bearing Restrictions: No      Mobility  Bed Mobility Overal bed mobility: Needs Assistance Bed Mobility: Rolling Rolling: Total assist         General bed mobility comments: totalA for rolling, but in one moment where she came out of lethargic state, she was able to come to long sitting independently before laying back down/returning to lethargic state    Transfers                 General transfer comment: deferred- lethargy  Ambulation/Gait             General Gait Details: deferred- lethargy  Stairs            Wheelchair Mobility    Modified Rankin  (Stroke Patients Only)       Balance                                             Pertinent Vitals/Pain Pain Assessment: Faces Faces Pain Scale: No hurt Pain Intervention(s): Limited activity within patient's tolerance;Monitored during session    Home Living                   Additional Comments: too lethargic to really give any information about home set up/PLOF    Prior Function           Comments: unsure- too lethargic to provide information     Hand Dominance        Extremity/Trunk Assessment   Upper Extremity Assessment Upper Extremity Assessment: Generalized weakness    Lower Extremity Assessment Lower Extremity Assessment: Generalized weakness    Cervical / Trunk Assessment Cervical / Trunk Assessment: Kyphotic  Communication   Communication: Other (comment) (lethargic)  Cognition Arousal/Alertness: Lethargic   Overall Cognitive Status: Difficult to assess                                 General Comments: difficult to assess cognition today due to lethargy. Did respond  to localized stimuli such as pinching nails or being rolled back and forth by PT, but quickly returned to lethargic state      General Comments General comments (skin integrity, edema, etc.): unable to assess balance at EOB or in standing today    Exercises     Assessment/Plan    PT Assessment Patient needs continued PT services  PT Problem List Decreased strength;Decreased activity tolerance;Decreased safety awareness;Decreased balance;Decreased mobility       PT Treatment Interventions DME instruction;Balance training;Gait training;Stair training;Functional mobility training;Patient/family education;Therapeutic activities;Therapeutic exercise    PT Goals (Current goals can be found in the Care Plan section)  Acute Rehab PT Goals PT Goal Formulation: Patient unable to participate in goal setting Time For Goal Achievement:  12/18/20 Potential to Achieve Goals: Fair    Frequency Min 3X/week   Barriers to discharge        Co-evaluation               AM-PAC PT "6 Clicks" Mobility  Outcome Measure Help needed turning from your back to your side while in a flat bed without using bedrails?: A Lot Help needed moving from lying on your back to sitting on the side of a flat bed without using bedrails?: A Lot Help needed moving to and from a bed to a chair (including a wheelchair)?: Total Help needed standing up from a chair using your arms (e.g., wheelchair or bedside chair)?: Total Help needed to walk in hospital room?: Total Help needed climbing 3-5 steps with a railing? : Total 6 Click Score: 8    End of Session   Activity Tolerance: Patient limited by lethargy Patient left: in bed;with call bell/phone within reach;with bed alarm set Nurse Communication: Mobility status PT Visit Diagnosis: Unsteadiness on feet (R26.81);Muscle weakness (generalized) (M62.81)    Time: 5830-7460 PT Time Calculation (min) (ACUTE ONLY): 10 min   Charges:   PT Evaluation $PT Eval Moderate Complexity: 1 Mod         Windell Norfolk, DPT, PN1   Supplemental Physical Therapist [place]    Pager [phone] Acute Rehab Office [phone]

## 2020-12-04 NOTE — H&P (Signed)
History and Physical    [name] TGG:[id number] DOB: Jan 16, 1931 DOA: 12/03/2020  PCP: Rodrigo Ran, MD   Patient coming from: Home   Chief Complaint: Cough, SOB   HPI: [name] is a 87 y.o. female with medical history significant for CAD with stent, hypertension, chronic diastolic CHF, dementia, and chronic kidney disease 3b, now presenting to the emergency department for evaluation of cough, shortness of breath, and outpatient chest x-ray concerning for possible pneumonia.  Patient is accompanied by her husband who assists with the history.  She reportedly developed a nonproductive cough yesterday and then shortness of breath today.  She saw her PCP for evaluation of the symptoms, was noted to be tachycardic at rest, tachypneic, and had a chest x-ray performed which raised concern for a basilar pneumonia.  She was directed to the ED for further evaluation of this.  She denies any chest pain, sputum production, fevers, or chills.  She reportedly had COVID and influenza testing today that was negative.  ED Course: Upon arrival to the ED, patient is found to be afebrile, saturating mid to upper 90s on room air initially, tachycardic to 120, and with stable blood pressure.  EKG features sinus tachycardia with rate of 122 and PAC.  Chest x-ray notable for mild left basilar atelectasis or infiltrate with small pleural effusion.  Chemistry panel notable for creatinine of 1.56.  CBC with mild microcytic anemia.  Lactic acid was normal.  BNP elevated to 2011.  Patient was given 500 cc of saline, Zofran, Rocephin, and azithromycin in the ED.  While still in the emergency department shortly after admission, oxygen saturations dropped to the 80s on room air and COVID-19 PCR returned positive.  Review of Systems:  All other systems reviewed and apart from HPI, are negative.  Past Medical History:  Diagnosis Date  . Chest pain   . Coronary atherosclerosis of native coronary artery    NSTEMI 11/10, LCx  stent  . Diabetes mellitus    type II  . Gout   . Hypertension   . Low back pain    chronic  . Obesity     Past Surgical History:  Procedure Laterality Date  . APPENDECTOMY    . CORONARY STENT PLACEMENT      Social History:   reports that she has never smoked. She has never used smokeless tobacco. She reports that she does not drink alcohol and does not use drugs.  Allergies  Allergen Reactions  . Allopurinol Hives  . Sulfonamide Derivatives Hives    Family History  Problem Relation Age of Onset  . Stroke Mother 77       deceased  . Heart attack Father 13       deceased  . Other Other        no CAD in siblings (3 brothers and 1 sister)     Prior to Admission medications   Medication Sig Start Date End Date Taking? Authorizing Provider  ACCU-CHEK AVIVA PLUS test strip  01/11/15   [provider]  aspirin 81 MG tablet Take 81 mg by mouth daily.    [provider]  atorvastatin (LIPITOR) 80 MG tablet Take 80 mg by mouth at bedtime.     [provider]  carvedilol (COREG) 12.5 MG tablet Take 12.5 mg by mouth 2 (two) times daily. 08/14/14   [provider]  ciprofloxacin (CIPRO) 500 MG tablet Take 1 tablet (500 mg total) by mouth 2 (two) times daily. 09/25/19   Zammit,  Jomarie Longs, MD  colchicine 0.6 MG tablet Take 1 tablet (0.6 mg total) by mouth daily. Patient not taking: Reported on 09/25/2019 08/21/14   Regalado, Jon Billings A, MD  febuxostat (ULORIC) 40 MG tablet Take 40 mg by mouth daily.    [provider]  ferrous sulfate 325 (65 FE) MG tablet Take 325 mg by mouth at bedtime.     [provider]  furosemide (LASIX) 20 MG tablet Take 1 tablet (20 mg total) by mouth daily. Patient not taking: Reported on 09/25/2019 08/21/14   Hartley Barefoot A, MD  furosemide (LASIX) 40 MG tablet Take 40 mg by mouth daily. 07/30/19   [provider]  ipratropium (ATROVENT) 0.06 % nasal spray Place 2 sprays into both nostrils 4 (four) times  daily. Patient not taking: Reported on 09/25/2019 07/04/14   Reuben Likes, MD  Lancets (ACCU-CHEK MULTICLIX) lancets Use as directed to check your blood sugar 01/11/15   [provider]  losartan (COZAAR) 100 MG tablet Take 100 mg by mouth daily.  02/26/15   [provider]  pantoprazole (PROTONIX) 40 MG tablet Take 40 mg by mouth daily. 07/08/19   [provider]  vancomycin (VANCOCIN) 50 mg/mL oral solution Take 2.5 mLs (125 mg total) by mouth every 6 (six) hours. Patient not taking: Reported on 09/25/2019 08/20/14   Regalado, Jon Billings A, MD  Vitamin D, Ergocalciferol, (DRISDOL) 1.25 MG (50000 UNIT) CAPS capsule Take 50,000 Units by mouth once a week. 07/30/19   [provider]    Physical Exam: Vitals:   12/03/20 1255 12/03/20 1832 12/03/20 2055  BP: (!) 156/95 (!) 149/110 (!) 146/95  Pulse: (!) 122 (!) 108 98  Resp: 15 14 20   Temp: (!) 97.3 F (36.3 C)  98.3 F (36.8 C)  TempSrc: Oral  Oral  SpO2: 97% 98% 96%    Constitutional: NAD, calm  Eyes: PERTLA, lids and conjunctivae normal ENMT: Mucous membranes are moist. Posterior pharynx clear of any exudate or lesions.   Neck: supple, no masses  Respiratory:  no wheezing, no crackles. No pallor or cyanosis.  Cardiovascular: Rate ~120 and regular. No extremity edema.  Abdomen: No distension, no tenderness, soft. Bowel sounds active.  Musculoskeletal: no clubbing / cyanosis. No joint deformity upper and lower extremities.   Skin: no significant rashes or ulcers. Seborrheic keratoses. Warm, dry, well-perfused. Neurologic: CN 2-12 grossly intact. Sensation intact. Moving all extremities.  Psychiatric: Alert and oriented to person, place, and situation. Restless. Cooperative.    Labs and Imaging on Admission: I have personally reviewed following labs and imaging studies  CBC: Recent Labs  Lab 12/03/20 1432  WBC 8.6  HGB 11.3*  HCT 37.4  MCV 108.1*  PLT 198   Basic Metabolic Panel: Recent Labs   Lab 12/03/20 1432  NA 144  K 4.6  CL 107  CO2 27  GLUCOSE 122*  BUN 28*  CREATININE 1.56*  CALCIUM 8.6*   GFR: CrCl cannot be calculated (Unknown ideal weight.). Liver Function Tests: No results for input(s): AST, ALT, ALKPHOS, BILITOT, PROT, ALBUMIN in the last 168 hours. No results for input(s): LIPASE, AMYLASE in the last 168 hours. No results for input(s): AMMONIA in the last 168 hours. Coagulation Profile: No results for input(s): INR, PROTIME in the last 168 hours. Cardiac Enzymes: No results for input(s): CKTOTAL, CKMB, CKMBINDEX, TROPONINI in the last 168 hours. BNP (last 3 results) No results for input(s): PROBNP in the last 8760 hours. HbA1C: No results for input(s): HGBA1C in the  last 72 hours. CBG: No results for input(s): GLUCAP in the last 168 hours. Lipid Profile: No results for input(s): CHOL, HDL, LDLCALC, TRIG, CHOLHDL, LDLDIRECT in the last 72 hours. Thyroid Function Tests: No results for input(s): TSH, T4TOTAL, FREET4, T3FREE, THYROIDAB in the last 72 hours. Anemia Panel: No results for input(s): VITAMINB12, FOLATE, FERRITIN, TIBC, IRON, RETICCTPCT in the last 72 hours. Urine analysis:    Component Value Date/Time   COLORURINE YELLOW 09/25/2019 2039   APPEARANCEUR CLEAR 09/25/2019 2039   LABSPEC 1.015 09/25/2019 2039   PHURINE 5.0 09/25/2019 2039   GLUCOSEU NEGATIVE 09/25/2019 2039   HGBUR NEGATIVE 09/25/2019 2039   BILIRUBINUR NEGATIVE 09/25/2019 2039   KETONESUR NEGATIVE 09/25/2019 2039   PROTEINUR NEGATIVE 09/25/2019 2039   NITRITE NEGATIVE 09/25/2019 2039   LEUKOCYTESUR MODERATE (A) 09/25/2019 2039   Sepsis Labs: @LABRCNTIP (procalcitonin:4,lacticidven:4) )No results found for this or any previous visit (from the past 240 hour(s)).   Radiological Exams on Admission: DG Chest 2 View  Result Date: 12/03/2020 CLINICAL DATA:  Pneumonia. EXAM: CHEST - 2 VIEW COMPARISON:  Nov 18, 2010. FINDINGS: The heart size and mediastinal contours are  within normal limits. No pneumothorax is noted. Right lung is clear. Mild left basilar atelectasis or infiltrate is noted with associated small left pleural effusion. The visualized skeletal structures are unremarkable. IMPRESSION: Mild left basilar atelectasis or infiltrate is noted with associated small left pleural effusion. Electronically Signed   By: Nov 20, 2010 M.D.   On: 12/03/2020 15:18    EKG: Independently reviewed. Sinus tachycardia, rate 122, PAC.   Assessment/Plan   1. COVID-19 infection; acute hypoxic respiratory failure  - Patient with 2 doses of Pfizer vaccine a year ago presents with one day of non-productive cough and SOB, was reportedly negative for flu and COVID in outpatient clinic, had CXR with possible LLL pneumonia, developed new 2 Lpm supplemental O2 requirement in ED, and COVID pcr is positive  - Continue Rocephin and azithromycin pending procalcitonin level, start systemic steroid and remdesivir, trend markers, continue supplemental O2 as needed    2. Chronic diastolic CHF  - Appears compensated  - Continue Lasix, ARB    3. CKD IIIb  - SCr is 1.56 on admission, down from 2.17 in March 2022  - Renally-dose medications, monitor    4. CAD  - Denies chest pain  - Continue statin, ASA     5. Dementia  - Continue Exelon - High-risk for hospital delirium, institute delirium precautions    DVT prophylaxis: sq heparin  Code Status: Full, discussed with patient and her husband  Level of Care: Level of care: Telemetry Medical Family Communication: Husband updated at bedside  Disposition Plan:  Patient is from: Home  Anticipated d/c is to: TBD Anticipated d/c date is: 12/06/20 Patient currently: Pending improvement in oxygenation   Consults called: None  Admission status: Observation; developed new O2 requirement and will likely need be changed to inpatient    12/08/20, MD [facility]  12/04/2020, 12:54 AM

## 2020-12-04 NOTE — Progress Notes (Signed)
   12/04/20 0935  Assess: MEWS Score  Temp 98.3 F (36.8 C)  BP 119/69  Pulse Rate (!) 110  ECG Heart Rate (!) 110  Resp (!) 24  SpO2 95 %  O2 Device Nasal Cannula  O2 Flow Rate (L/min) 3 L/min  Assess: MEWS Score  MEWS Temp 0  MEWS Systolic 0  MEWS Pulse 1  MEWS RR 1  MEWS LOC 0  MEWS Score 2  MEWS Score Color Yellow  Assess: if the MEWS score is Yellow or Red  Were vital signs taken at a resting state? No  Focused Assessment No change from prior assessment  Early Detection of Sepsis Score *See Row Information* Low  MEWS guidelines implemented *See Row Information* Yes  Treat  MEWS Interventions Escalated (See documentation below)  Pain Scale 0-10  Pain Score 0  Take Vital Signs  Increase Vital Sign Frequency  Yellow: Q 2hr X 2 then Q 4hr X 2, if remains yellow, continue Q 4hrs  Escalate  MEWS: Escalate Yellow: discuss with charge nurse/RN and consider discussing with provider and RRT  Notify: Charge Nurse/RN  Name of Charge Nurse/RN Notified Erin, RN  Date Charge Nurse/RN Notified 12/04/20  Time Charge Nurse/RN Notified 1000  Document  Patient Outcome Stabilized after interventions  Progress note created (see row info) Yes

## 2020-12-04 NOTE — Progress Notes (Signed)
PROGRESS NOTE    [name]  NUU:[id number] DOB: 08-08-1930 DOA: 12/03/2020 PCP: Rodrigo Ran, MD    Brief Narrative:  87 year old female with history of coronary artery disease with stent, hypertension, chronic diastolic dysfunction, dementia, CKD stage IIIb presented to the emergency department for evaluation of cough and shortness of breath, outpatient chest x-ray concerning for pneumonia.  She does have dementia, only 1 day of productive cough.  Reported negative COVID-19 test in the office.  At the emergency room afebrile.  90% room air.  Tachycardic 120.  Stable blood pressures.  Chest x-ray with mild left basilar atelectasis or infiltrate.  Creatinine 1.56.  Lactic acid normal.  While in the emergency room, reported that oxygen dropped to 80% and COVID-19 turned out to be positive.   Assessment & Plan:   Principal Problem:   Pneumonia Active Problems:   CORONARY ATHEROSCLEROSIS NATIVE CORONARY ARTERY   Chronic kidney disease, stage 3b (HCC)   Chronic diastolic CHF (congestive heart failure) (HCC)   COVID-19 virus infection   Pneumonia due to COVID-19 virus  Pneumonia due to COVID-19 virus: Continue to monitor due to significant symptoms  chest physiotherapy, incentive spirometry, deep breathing exercises, sputum induction, mucolytic's and bronchodilators. Supplemental oxygen to keep saturations more than 90%. Covid directed therapy with , steroids, on Solu-Medrol remdesivir, day 1/3 antibiotics, not indicated.  Given Rocephin and azithromycin in the ER.  Procalcitonin is normal.  Likely viral pneumonia.  Will discontinue antibiotics. Due to severity of symptoms, patient will need daily inflammatory markers, chest x-rays, liver function test to monitor and direct COVID-19 therapies.  Chronic diastolic congestive heart failure: Euvolemic.  Compensated.  On Lasix and ARB.  Continue.  CKD stage IIIb: Fairly stable at baseline.  Continue to monitor.  Coronary artery disease:  On a statin and aspirin.  Denies any chest pain.  History of dementia without behavioral disturbances: On Exelon patch.  All-time fall precautions and delirium precautions.  Hypomagnesemia: Severe and persistent.  Replace aggressively.   DVT prophylaxis: heparin injection 5,000 Units Start: 12/04/20 0045   Code Status: Full code Family Communication: None Disposition Plan: Status is: Inpatient  Remains inpatient appropriate because:Inpatient level of care appropriate due to severity of illness   Dispo: The patient is from: Home              Anticipated d/c is to: Home              Patient currently is not medically stable to d/c.   Difficult to place patient No         Consultants:   None  Procedures:   None  Antimicrobials:  Antibiotics Given (last 72 hours)    Date/Time Action Medication Dose Rate   12/03/20 2327 New Bag/Given   cefTRIAXone (ROCEPHIN) 1 g in sodium chloride 0.9 % 100 mL IVPB 1 g 200 mL/hr   12/04/20 0002 New Bag/Given   azithromycin (ZITHROMAX) 500 mg in sodium chloride 0.9 % 250 mL IVPB 500 mg 250 mL/hr   12/04/20 0245 New Bag/Given   remdesivir 200 mg in sodium chloride 0.9% 250 mL IVPB 200 mg 580 mL/hr         Subjective: Patient seen and examined.  She was quite sleepy.  Opened her eyes but did not participate in interview.  Remains afebrile.    Objective: Vitals:   12/04/20 0800 12/04/20 0900 12/04/20 0935 12/04/20 1159  BP: 134/77 117/79 119/69 121/67  Pulse: (!) 107 (!) 116 (!) 110 96  Resp: Marland Kitchen)  25 (!) 30 (!) 24 (!) 21  Temp:   98.3 F (36.8 C) (!) 97.5 F (36.4 C)  TempSrc:   Axillary Axillary  SpO2: 100% 98% 95% 97%  Weight:       No intake or output data in the 24 hours ending 12/04/20 1446 Filed Weights   12/04/20 0200  Weight: 82 kg    Examination:  General exam: Frail debilitated but quiet and calm. SpO2: 97 % O2 Flow Rate (L/min): 3 L/min Respiratory system: Mostly conducted airway sounds. Cardiovascular  system: S1 & S2 heard, RRR.  Tachycardic.  No edema. Gastrointestinal system: Abdomen is nondistended, soft and nontender. No organomegaly or masses felt. Normal bowel sounds heard. Central nervous system: Alert and awake.  No focal neurological deficits.     Data Reviewed: I have personally reviewed following labs and imaging studies  CBC: Recent Labs  Lab 12/03/20 1432  WBC 8.6  HGB 11.3*  HCT 37.4  MCV 108.1*  PLT 198   Basic Metabolic Panel: Recent Labs  Lab 12/03/20 1432 12/04/20 0552  NA 144  --   K 4.6  --   CL 107  --   CO2 27  --   GLUCOSE 122*  --   BUN 28*  --   CREATININE 1.56*  --   CALCIUM 8.6*  --   MG  --  1.3*   GFR: CrCl cannot be calculated (Unknown ideal weight.). Liver Function Tests: No results for input(s): AST, ALT, ALKPHOS, BILITOT, PROT, ALBUMIN in the last 168 hours. No results for input(s): LIPASE, AMYLASE in the last 168 hours. No results for input(s): AMMONIA in the last 168 hours. Coagulation Profile: No results for input(s): INR, PROTIME in the last 168 hours. Cardiac Enzymes: No results for input(s): CKTOTAL, CKMB, CKMBINDEX, TROPONINI in the last 168 hours. BNP (last 3 results) No results for input(s): PROBNP in the last 8760 hours. HbA1C: No results for input(s): HGBA1C in the last 72 hours. CBG: No results for input(s): GLUCAP in the last 168 hours. Lipid Profile: No results for input(s): CHOL, HDL, LDLCALC, TRIG, CHOLHDL, LDLDIRECT in the last 72 hours. Thyroid Function Tests: Recent Labs    12/04/20 0553  TSH 3.044   Anemia Panel: No results for input(s): VITAMINB12, FOLATE, FERRITIN, TIBC, IRON, RETICCTPCT in the last 72 hours. Sepsis Labs: Recent Labs  Lab 12/03/20 1432 12/03/20 2221 12/04/20 0500  PROCALCITON <0.10  --  <0.10  LATICACIDVEN  --  1.8  --     Recent Results (from the past 240 hour(s))  Resp Panel by RT-PCR (Flu A&B, Covid) Nasopharyngeal Swab     Status: Abnormal   Collection Time: 12/03/20  10:43 PM   Specimen: Nasopharyngeal Swab; Nasopharyngeal(NP) swabs in vial transport medium  Result Value Ref Range Status   SARS Coronavirus 2 by RT PCR POSITIVE (A) NEGATIVE Final    Comment: RESULT CALLED TO, READ BACK BY AND VERIFIED WITH: RN J. JADIER 0120 [id number] FCP (NOTE) SARS-CoV-2 target nucleic acids are DETECTED.  The SARS-CoV-2 RNA is generally detectable in upper respiratory specimens during the acute phase of infection. Positive results are indicative of the presence of the identified virus, but do not rule out bacterial infection or co-infection with other pathogens not detected by the test. Clinical correlation with patient history and other diagnostic information is necessary to determine patient infection status. The expected result is Negative.  Fact Sheet for Patients: BloggerCourse.com  Fact Sheet for Healthcare Providers: SeriousBroker.it  This test is not yet  approved or cleared by the [place] and  has been authorized for detection and/or diagnosis of SARS-CoV-2 by FDA under an Emergency Use Authorization (EUA).  This EUA will remain in effect (meaning this test can be used ) for the duration of  the COVID-19 declaration under Section 564(b)(1) of the Act, 21 U.S.C. section 360bbb-3(b)(1), unless the authorization is terminated or revoked sooner.     Influenza A by PCR NEGATIVE NEGATIVE Final   Influenza B by PCR NEGATIVE NEGATIVE Final    Comment: (NOTE) The Xpert Xpress SARS-CoV-2/FLU/RSV plus assay is intended as an aid in the diagnosis of influenza from Nasopharyngeal swab specimens and should not be used as a sole basis for treatment. Nasal washings and aspirates are unacceptable for Xpert Xpress SARS-CoV-2/FLU/RSV testing.  Fact Sheet for Patients: BloggerCourse.com  Fact Sheet for Healthcare Providers: SeriousBroker.it  This test is  not yet approved or cleared by the [place] FDA and has been authorized for detection and/or diagnosis of SARS-CoV-2 by FDA under an Emergency Use Authorization (EUA). This EUA will remain in effect (meaning this test can be used) for the duration of the COVID-19 declaration under Section 564(b)(1) of the Act, 21 U.S.C. section 360bbb-3(b)(1), unless the authorization is terminated or revoked.  Performed at [facility], [address] [place], [place] [address]          Radiology Studies: DG Chest 2 View  Result Date: 12/03/2020 CLINICAL DATA:  Pneumonia. EXAM: CHEST - 2 VIEW COMPARISON:  Nov 18, 2010. FINDINGS: The heart size and mediastinal contours are within normal limits. No pneumothorax is noted. Right lung is clear. Mild left basilar atelectasis or infiltrate is noted with associated small left pleural effusion. The visualized skeletal structures are unremarkable. IMPRESSION: Mild left basilar atelectasis or infiltrate is noted with associated small left pleural effusion. Electronically Signed   By: Lupita Raider M.D.   On: 12/03/2020 15:18        Scheduled Meds: . aspirin  81 mg Oral Daily  . atorvastatin  80 mg Oral QHS  . furosemide  40 mg Oral Daily  . heparin  5,000 Units Subcutaneous Q8H  . losartan  100 mg Oral Daily  . methylPREDNISolone (SOLU-MEDROL) injection  0.5 mg/kg Intravenous Q12H   Followed by  . [START ON 12/07/2020] predniSONE  50 mg Oral Daily  . ondansetron (ZOFRAN) IV  4 mg Intravenous Once  . pantoprazole  40 mg Oral Daily  . rivastigmine  4.6 mg Transdermal Daily   Continuous Infusions: . sodium chloride    . magnesium sulfate bolus IVPB 2 g (12/04/20 0948)  . [START ON 12/05/2020] remdesivir 100 mg in NS 100 mL       LOS: 0 days    Time spent: 30 minutes    Dorcas Carrow, MD [facility] Pager [phone]

## 2020-12-05 DIAGNOSIS — J1282 Pneumonia due to coronavirus disease 2019: Secondary | ICD-10-CM

## 2020-12-05 DIAGNOSIS — U071 COVID-19: Secondary | ICD-10-CM | POA: Diagnosis not present

## 2020-12-05 DIAGNOSIS — N1832 Chronic kidney disease, stage 3b: Secondary | ICD-10-CM | POA: Diagnosis not present

## 2020-12-05 DIAGNOSIS — J189 Pneumonia, unspecified organism: Secondary | ICD-10-CM | POA: Diagnosis not present

## 2020-12-05 LAB — PROCALCITONIN: Procalcitonin: <0.1 ng/mL

## 2020-12-05 LAB — CBC WITH DIFFERENTIAL/PLATELET
Abs Immature Granulocytes: 0.02 10*3/uL (ref 0.00–0.07)
Basophils Absolute: 0 10*3/uL (ref 0.0–0.1)
Basophils Relative: 0 %
Eosinophils Absolute: 0 10*3/uL (ref 0.0–0.5)
Eosinophils Relative: 0 %
HCT: 31.5 % — ABNORMAL LOW (ref 36.0–46.0)
Hemoglobin: 10 g/dL — ABNORMAL LOW (ref 12.0–15.0)
Immature Granulocytes: 0 %
Lymphocytes Relative: 6 %
Lymphs Abs: 0.4 10*3/uL — ABNORMAL LOW (ref 0.7–4.0)
MCH: 33.6 pg (ref 26.0–34.0)
MCHC: 31.7 g/dL (ref 30.0–36.0)
MCV: 105.7 fL — ABNORMAL HIGH (ref 80.0–100.0)
Monocytes Absolute: 0.2 10*3/uL (ref 0.1–1.0)
Monocytes Relative: 2 %
Neutro Abs: 6.2 10*3/uL (ref 1.7–7.7)
Neutrophils Relative %: 92 %
Platelets: 142 10*3/uL — ABNORMAL LOW (ref 150–400)
RBC: 2.98 MIL/uL — ABNORMAL LOW (ref 3.87–5.11)
RDW: 13.6 % (ref 11.5–15.5)
WBC: 6.8 10*3/uL (ref 4.0–10.5)
nRBC: 0 % (ref 0.0–0.2)

## 2020-12-05 LAB — COMPREHENSIVE METABOLIC PANEL
ALT: 11 U/L (ref 0–44)
AST: 15 U/L (ref 15–41)
Albumin: 3 g/dL — ABNORMAL LOW (ref 3.5–5.0)
Alkaline Phosphatase: 81 U/L (ref 38–126)
Anion gap: 10 (ref 5–15)
BUN: 38 mg/dL — ABNORMAL HIGH (ref 8–23)
CO2: 25 mmol/L (ref 22–32)
Calcium: 8.5 mg/dL — ABNORMAL LOW (ref 8.9–10.3)
Chloride: 105 mmol/L (ref 98–111)
Creatinine, Ser: 1.83 mg/dL — ABNORMAL HIGH (ref 0.44–1.00)
GFR, Estimated: 26 mL/min — ABNORMAL LOW (ref >60)
Glucose, Bld: 164 mg/dL — ABNORMAL HIGH (ref 70–99)
Potassium: 4.6 mmol/L (ref 3.5–5.1)
Sodium: 140 mmol/L (ref 135–145)
Total Bilirubin: 0.8 mg/dL (ref 0.3–1.2)
Total Protein: 5.4 g/dL — ABNORMAL LOW (ref 6.5–8.1)

## 2020-12-05 LAB — MAGNESIUM: Magnesium: 2.2 mg/dL (ref 1.7–2.4)

## 2020-12-05 LAB — C-REACTIVE PROTEIN: CRP: 0.6 mg/dL (ref <1.0)

## 2020-12-05 LAB — PHOSPHORUS: Phosphorus: 4.1 mg/dL (ref 2.5–4.6)

## 2020-12-05 LAB — D-DIMER, QUANTITATIVE: D-Dimer, Quant: 1.11 ug/mL-FEU — ABNORMAL HIGH (ref 0.00–0.50)

## 2020-12-05 MED ORDER — DIPHENHYDRAMINE HCL 50 MG/ML IJ SOLN: 50.0000 mg | Freq: Once | INTRAMUSCULAR | Status: AC | PRN

## 2020-12-05 MED ORDER — METHYLPREDNISOLONE SODIUM SUCC 125 MG IJ SOLR: 125.0000 mg | Freq: Once | INTRAMUSCULAR | Status: AC | PRN

## 2020-12-05 MED ORDER — EPINEPHRINE 0.3 MG/0.3ML IJ SOAJ: 0.3000 mg | Freq: Once | INTRAMUSCULAR | Status: AC | PRN

## 2020-12-05 MED ORDER — SODIUM CHLORIDE 0.9 % IV SOLN: 100.0000 mg | Freq: Once | INTRAVENOUS | Status: DC

## 2020-12-05 MED ORDER — FAMOTIDINE IN NACL 20-0.9 MG/50ML-% IV SOLN: 20.0000 mg | Freq: Once | INTRAVENOUS | Status: AC | PRN

## 2020-12-05 MED ORDER — SODIUM CHLORIDE 0.9 % IV SOLN: INTRAVENOUS | Status: DC | PRN

## 2020-12-05 MED ORDER — ALBUTEROL SULFATE HFA 108 (90 BASE) MCG/ACT IN AERS: 2.0000 | INHALATION_SPRAY | Freq: Once | RESPIRATORY_TRACT | Status: AC | PRN

## 2020-12-05 MED ORDER — PREDNISONE 50 MG PO TABS: 50.0000 mg | ORAL_TABLET | Freq: Every day | ORAL | 0 refills | Status: AC

## 2020-12-05 NOTE — Progress Notes (Signed)
Patient is scheduled for a Remdesivir infusion on 5/27 at 130PM. Have patient come to [address], drive between the [employer] on the left and [facility] on the right - take the second left parking lot entrance, they will see a COVID infusion parking sign by the entrance.  Park and call [phone] and someone will come out and bring them inside. If someone is driving them please have them come to the same area and call the number and someone will come outside to get them. Thank you!

## 2020-12-05 NOTE — Progress Notes (Signed)
[name] to be D/C'd home per MD order. Discussed with the patient and husband and all questions fully answered.  Skin clean, dry and intact without evidence of skin break down, no evidence of skin tears noted. Patient's daughter [name] given instructions on visiting the infusion clinic for her last dose of remdesivir. Daughter voices understanding and will relay message to husband. IV catheter discontinued intact. Site without signs and symptoms of complications. Dressing and pressure applied.  An After Visit Summary was printed and given to the patient.  Patient escorted via WC, and D/C home via private auto.  Jon Gills  12/05/2020

## 2020-12-05 NOTE — Patient Instructions (Signed)
You are scheduled for a Remdesivir infusion on 5/27 at 130PM. Please come to [address], drive between the [employer] on the left and [facility] on the right - take the second left parking lot entrance, you will see a COVID infusion parking sign by the entrance.  Park and call [phone] and someone will come out and bring you inside. If someone is driving you please come to the same area and call the number and someone will come outside to get you. Thank you!

## 2020-12-05 NOTE — Progress Notes (Signed)
Physical Therapy Treatment Patient Details Name: Bentlie Withem MRN: [id number] DOB: Oct 24, 1930 Today's Date: 12/05/2020    History of Present Illness 87yo female admitted on 12/03/20 with c/o cough and SOB after being referred to the ED by her PCP due to tachycardia, tachypneia, and CXR concerning for pneumonia. Found to be covid positive and in acute hypoxic respiratory failure. PMH NSTEMI, DM, gout, HTN, LBP, CHF, dementia    PT Comments    Patient received up in recliner with spouse and sitter present. Able to mobilize on a Min guard to MinA basis without device, but was quite unsteady at times with dynamic standing tasks. Would likely do much better with BUE support but no RW was available in the room. SPO2 in the 90s with activity with one time drop to 86%, but she was able to recover with seated rest/PLB on room air. Left up in recliner with safety sitter and spouse present. Recommend HHPT f/u at DC.     Follow Up Recommendations  Home health PT;Supervision/Assistance - 24 hour     Equipment Recommendations  Rolling walker with 5" wheels    Recommendations for Other Services       Precautions / Restrictions Precautions Precautions: Fall;Other (comment) Precaution Comments: covid + Restrictions Weight Bearing Restrictions: No    Mobility  Bed Mobility               General bed mobility comments: OOB in recliner    Transfers Overall transfer level: Needs assistance Equipment used: None Transfers: Sit to/from Stand Sit to Stand: Min guard         General transfer comment: min guard for safety, no physical assist given  Ambulation/Gait Ambulation/Gait assistance: Min assist Gait Distance (Feet): 120 Feet (74ftx2) Assistive device: None Gait Pattern/deviations: Step-through pattern;Narrow base of support;Drifts right/left;Trunk flexed Gait velocity: decreased   General Gait Details: generally unsteady without BUE support- needed MinA on multiple occasions to  maintain balance. Feel she would do much better with RW, but one was not available in the room.   Stairs             Wheelchair Mobility    Modified Rankin (Stroke Patients Only)       Balance Overall balance assessment: Needs assistance Sitting-balance support: No upper extremity supported;Feet supported Sitting balance-Leahy Scale: Good     Standing balance support: No upper extremity supported;During functional activity Standing balance-Leahy Scale: Poor Standing balance comment: MinA to maintain balance without BUE support                            Cognition Arousal/Alertness: Awake/alert Behavior During Therapy: WFL for tasks assessed/performed Overall Cognitive Status: History of cognitive impairments - at baseline                                 General Comments: dementia at baseline, seems a bit HOH but did follow cues/commands well      Exercises      General Comments General comments (skin integrity, edema, etc.): see O2 sat note for SPO2 details      Pertinent Vitals/Pain Pain Assessment: Faces Faces Pain Scale: No hurt Pain Intervention(s): Limited activity within patient's tolerance;Monitored during session    Home Living Family/patient expects to be discharged to:: Private residence Living Arrangements: Spouse/significant other Available Help at Discharge: Family;Available 24 hours/day Type of Home: House Home Access: Level entry  Home Layout: One level Home Equipment: Grab bars - tub/shower;Bedside commode;Walker - 2 wheels;Cane - single point Additional Comments: no falls recently, no close calls    Prior Function Level of Independence: Independent      Comments: does not use AD usually, not on O2 normally   PT Goals (current goals can now be found in the care plan section) Acute Rehab PT Goals PT Goal Formulation: Patient unable to participate in goal setting Time For Goal Achievement: 12/18/20 Potential  to Achieve Goals: Fair Progress towards PT goals: Progressing toward goals    Frequency    Min 3X/week      PT Plan Discharge plan needs to be updated;Equipment recommendations need to be updated    Co-evaluation              AM-PAC PT "6 Clicks" Mobility   Outcome Measure  Help needed turning from your back to your side while in a flat bed without using bedrails?: None Help needed moving from lying on your back to sitting on the side of a flat bed without using bedrails?: A Little Help needed moving to and from a bed to a chair (including a wheelchair)?: A Little Help needed standing up from a chair using your arms (e.g., wheelchair or bedside chair)?: A Little Help needed to walk in hospital room?: A Little Help needed climbing 3-5 steps with a railing? : A Little 6 Click Score: 19    End of Session   Activity Tolerance: Patient tolerated treatment well Patient left: in chair;with call bell/phone within reach;with family/visitor present;with nursing/sitter in room Nurse Communication: Mobility status PT Visit Diagnosis: Unsteadiness on feet (R26.81);Muscle weakness (generalized) (M62.81)     Time: 9485-4627 PT Time Calculation (min) (ACUTE ONLY): 17 min  Charges:  $Gait Training: 8-22 mins                     Madelaine Etienne, DPT, PN1   Supplemental Physical Therapist [facility]    Pager [phone] Acute Rehab Office [phone]

## 2020-12-05 NOTE — Discharge Summary (Signed)
Physician Discharge Summary  [name] OEV:[id number] DOB: 04-Oct-1930 DOA: 12/03/2020  PCP: Rodrigo Ran, MD  Admit date: 12/03/2020 Discharge date: 12/05/2020  Admitted From: Home Disposition: Home with home health  Recommendations for Outpatient Follow-up:  1. Follow up with PCP in 1-2 weeks 2. Please obtain BMP/CBC in one week 3. Follow-up tomorrow at remdesivir clinic for infusion.  Home Health: PT Equipment/Devices: Not needed  Discharge Condition: Stable CODE STATUS: Full code Diet recommendation: Low-salt diet  Discharge summary: 87 year old female with history of coronary artery disease with stent, hypertension, chronic diastolic dysfunction, dementia, CKD stage IIIb presented to the emergency department for evaluation of cough and shortness of breath, outpatient chest x-ray concerning for pneumonia.  She does have dementia, only 1 day of productive cough.  Reported negative COVID-19 test in the office before coming to the ER. At the emergency room afebrile.  90% room air.  Tachycardic 120.  Stable blood pressures.  Chest x-ray with mild left basilar atelectasis or infiltrate.  Creatinine 1.56.  Lactic acid normal.  While in the emergency room, reported that oxygen dropped to 80% and COVID-19 turned out to be positive so admitted for COVID-19 treatment.  Assessment & Plan:  # Pneumonia due to COVID-19 virus: Patient admitted to hospital and treated with high-dose Solu-Medrol, remdesivir day 2/3 today. Inflammatory markers remained stable. Initially received Rocephin and azithromycin, procalcitonin normal and less likely bacterial infection.  Antibiotics discontinued. Patient is fairly stable today and wants to go home, will schedule to receive third dose of remdesivir tomorrow at infusion clinic.  Her husband is able to drive her to the infusion clinic. Patient had transient drop in oxygen, however on further movement she is able to keep up her oxygen more than 90%.  She has  some dry cough but no other symptoms. She will also do over-the-counter cough medications. Will do short course of prednisone 50 mg for 5 more days. Airborne isolation for 7 days advised.  # Chronic diastolic congestive heart failure: Euvolemic.  Compensated.  On Lasix and ARB.  Continue at home.  # CKD stage IIIb: Fairly stable at baseline.  Creatinine is 1.8 today.  Please recheck in 1 week.  # Coronary artery disease: On a statin and aspirin.    No acute issues.  # Hypomagnesemia: Presented with magnesium 1.3.  With poor appetite.  With this was replaced aggressively with improvement.  Patient has mild symptomatology with COVID with chest x-ray evidence of possible pneumonia and was admitted to hospital and treated with remdesivir and steroids.  She does have mild dementia, patient and husband insist that they will do better at home.  Mobilized and patient could keep up her oxygenation and is not obviously short of breath.  Additional oxygen tubings will be more problematic and difficult to manage per her and she declines to use it.  Since patient is overall stable and her husband is able to drive her to the infusion center tomorrow, they want to go home so discharged on request.      Discharge Diagnoses:  Principal Problem:   Pneumonia Active Problems:   CORONARY ATHEROSCLEROSIS NATIVE CORONARY ARTERY   Chronic kidney disease, stage 3b (HCC)   Chronic diastolic CHF (congestive heart failure) (HCC)   COVID-19 virus infection   Pneumonia due to COVID-19 virus    Discharge Instructions  Discharge Instructions    Call MD for:  difficulty breathing, headache or visual disturbances   Complete by: As directed    Call MD for:  extreme fatigue   Complete by: As directed    Call MD for:  temperature >100.4   Complete by: As directed    DO NOT hold remdesivir infusion if CMP has been ordered   Complete by: As directed    Diet - low sodium heart healthy   Complete by: As  directed    Discharge instructions   Complete by: As directed    Can use over-the-counter cough medications and Tylenol.   Hypersensitivity GRADE 1: Transient flushing or rash, or drug fever < 100.4 F   Complete by: As directed    Routine, As needed For Until specified Hold infusion for at least 30 minutes Restart infusion at 50% infusion rate if asymptomatic   Hypersensitivity GRADE 2: Rash, flushing, urticaria, dyspnea, or drug fever = or > 100.4 F   Complete by: As directed    Routine, As needed For Until specified Hold infusion for 30 minutes Give medications as ordered   Hypersensitivity GRADE 3: symptomatic bronchospasm, with or without urticaria, parenteral medication management indicated, allergy-related edema/angioedema, or hypotension   Complete by: As directed    Routine, As needed For Until specified Hold infusion for at least 30 minutes Give medications as ordered   Hypersensitivity GRADE 4: Anaphylaxis   Complete by: As directed    Routine, As needed For Until specified Hold infusion Give medications as ordered Call MD   Increase activity slowly   Complete by: As directed    Notify physician if CMP ordered the previous day and ALT > 220   Complete by: As directed    if CMP ordered the previous day and ALT > 220   Vital signs at baseline and at end of infusion   Complete by: As directed      Allergies as of 12/05/2020      Reactions   Allopurinol Hives   Sulfonamide Derivatives Hives      Medication List    STOP taking these medications   carvedilol 12.5 MG tablet Commonly known as: COREG     TAKE these medications   Accu-Chek Aviva Plus test strip Generic drug: glucose blood   accu-chek multiclix lancets Use as directed to check your blood sugar   aspirin 81 MG tablet Take 81 mg by mouth daily.   atorvastatin 80 MG tablet Commonly known as: LIPITOR Take 80 mg by mouth at bedtime.   febuxostat 40 MG tablet Commonly known as: ULORIC Take 40  mg by mouth daily.   ferrous sulfate 325 (65 FE) MG tablet Take 325 mg by mouth at bedtime.   furosemide 40 MG tablet Commonly known as: LASIX Take 40 mg by mouth daily. What changed: Another medication with the same name was removed. Continue taking this medication, and follow the directions you see here.   losartan 100 MG tablet Commonly known as: COZAAR Take 100 mg by mouth daily.   pantoprazole 40 MG tablet Commonly known as: PROTONIX Take 40 mg by mouth daily.   predniSONE 50 MG tablet Commonly known as: DELTASONE Take 1 tablet (50 mg total) by mouth daily for 5 days. Start taking on: Dec 06, 2020   rivastigmine 4.6 mg/24hr Commonly known as: EXELON Place 4.6 mg onto the skin daily.   Vitamin D (Ergocalciferol) 1.25 MG (50000 UNIT) Caps capsule Commonly known as: DRISDOL Take 50,000 Units by mouth once a week.       Follow-up Information    Rodrigo Ran, MD Follow up in 1 week(s).   Specialty:  Internal Medicine Contact information: [address] [place] [address] [phone]              Allergies  Allergen Reactions  . Allopurinol Hives  . Sulfonamide Derivatives Hives    Consultations:  None   Procedures/Studies: DG Chest 2 View  Result Date: 12/03/2020 CLINICAL DATA:  Pneumonia. EXAM: CHEST - 2 VIEW COMPARISON:  Nov 18, 2010. FINDINGS: The heart size and mediastinal contours are within normal limits. No pneumothorax is noted. Right lung is clear. Mild left basilar atelectasis or infiltrate is noted with associated small left pleural effusion. The visualized skeletal structures are unremarkable. IMPRESSION: Mild left basilar atelectasis or infiltrate is noted with associated small left pleural effusion. Electronically Signed   By: Lupita Raider M.D.   On: 12/03/2020 15:18   (Echo, Carotid, EGD, Colonoscopy, ERCP)    Subjective: Patient seen and examined.  Overnight had some confusion and had sitter.  Today morning, her husband is at  the bedside.  Patient is talkative.  She was on 1 to 2 L of oxygen and was able to wean off. Patient wants to go home and her husband at the bedside wants to take her home because they think she will be more comfortable at home and he thinks she is at her usual self.   Discharge Exam: Vitals:   12/04/20 2150 12/05/20 0416  BP: 110/70 104/69  Pulse: 85 96  Resp:  18  Temp: 98.5 F (36.9 C) 97.8 F (36.6 C)  SpO2: 95% 98%   Vitals:   12/04/20 2010 12/04/20 2150 12/05/20 0416 12/05/20 1200  BP: 109/73 110/70 104/69   Pulse: 86 85 96   Resp: 18  18   Temp: 98.6 F (37 C) 98.5 F (36.9 C) 97.8 F (36.6 C)   TempSrc: Oral Oral    SpO2: 94% 95% 98%   Weight:      Height:     (1.626 m)    General: Pt is alert, awake, not in acute distress Patient is oriented x3.  Conversant.  Not in any distress.  Not on any supplemental oxygen. Cardiovascular: RRR, S1/S2 +, no rubs, no gallops Respiratory: CTA bilaterally, no wheezing, no rhonchi Abdominal: Soft, NT, ND, bowel sounds + Extremities: no edema, no cyanosis    The results of significant diagnostics from this hospitalization (including imaging, microbiology, ancillary and laboratory) are listed below for reference.     Microbiology: Recent Results (from the past 240 hour(s))  Resp Panel by RT-PCR (Flu A&B, Covid) Nasopharyngeal Swab     Status: Abnormal   Collection Time: 12/03/20 10:43 PM   Specimen: Nasopharyngeal Swab; Nasopharyngeal(NP) swabs in vial transport medium  Result Value Ref Range Status   SARS Coronavirus 2 by RT PCR POSITIVE (A) NEGATIVE Final    Comment: RESULT CALLED TO, READ BACK BY AND VERIFIED WITH: RN J. Skip Mayer [id number] FCP (NOTE) SARS-CoV-2 target nucleic acids are DETECTED.  The SARS-CoV-2 RNA is generally detectable in upper respiratory specimens during the acute phase of infection. Positive results are indicative of the presence of the identified virus, but do not rule out bacterial  infection or co-infection with other pathogens not detected by the test. Clinical correlation with patient history and other diagnostic information is necessary to determine patient infection status. The expected result is Negative.  Fact Sheet for Patients: BloggerCourse.com  Fact Sheet for Healthcare Providers: SeriousBroker.it  This test is not yet approved or cleared by the [place] FDA and  has  been authorized for detection and/or diagnosis of SARS-CoV-2 by FDA under an Emergency Use Authorization (EUA).  This EUA will remain in effect (meaning this test can be used ) for the duration of  the COVID-19 declaration under Section 564(b)(1) of the Act, 21 U.S.C. section 360bbb-3(b)(1), unless the authorization is terminated or revoked sooner.     Influenza A by PCR NEGATIVE NEGATIVE Final   Influenza B by PCR NEGATIVE NEGATIVE Final    Comment: (NOTE) The Xpert Xpress SARS-CoV-2/FLU/RSV plus assay is intended as an aid in the diagnosis of influenza from Nasopharyngeal swab specimens and should not be used as a sole basis for treatment. Nasal washings and aspirates are unacceptable for Xpert Xpress SARS-CoV-2/FLU/RSV testing.  Fact Sheet for Patients: BloggerCourse.com  Fact Sheet for Healthcare Providers: SeriousBroker.it  This test is not yet approved or cleared by the [place] FDA and has been authorized for detection and/or diagnosis of SARS-CoV-2 by FDA under an Emergency Use Authorization (EUA). This EUA will remain in effect (meaning this test can be used) for the duration of the COVID-19 declaration under Section 564(b)(1) of the Act, 21 U.S.C. section 360bbb-3(b)(1), unless the authorization is terminated or revoked.  Performed at [facility], [address], [place], [place] [address]      Labs: BNP (last 3 results) Recent Labs     12/03/20 1432  BNP 2,011.1*   Basic Metabolic Panel: Recent Labs  Lab 12/03/20 1432 12/04/20 0552 12/05/20 0127  NA 144  --  140  K 4.6  --  4.6  CL 107  --  105  CO2 27  --  25  GLUCOSE 122*  --  164*  BUN 28*  --  38*  CREATININE 1.56*  --  1.83*  CALCIUM 8.6*  --  8.5*  MG  --  1.3* 2.2  PHOS  --   --  4.1   Liver Function Tests: Recent Labs  Lab 12/05/20 0127  AST 15  ALT 11  ALKPHOS 81  BILITOT 0.8  PROT 5.4*  ALBUMIN 3.0*   No results for input(s): LIPASE, AMYLASE in the last 168 hours. No results for input(s): AMMONIA in the last 168 hours. CBC: Recent Labs  Lab 12/03/20 1432 12/05/20 0127  WBC 8.6 6.8  NEUTROABS  --  6.2  HGB 11.3* 10.0*  HCT 37.4 31.5*  MCV 108.1* 105.7*  PLT 198 142*   Cardiac Enzymes: No results for input(s): CKTOTAL, CKMB, CKMBINDEX, TROPONINI in the last 168 hours. BNP: Invalid input(s): POCBNP CBG: No results for input(s): GLUCAP in the last 168 hours. D-Dimer Recent Labs    12/05/20 0127  DDIMER 1.11*   Hgb A1c No results for input(s): HGBA1C in the last 72 hours. Lipid Profile No results for input(s): CHOL, HDL, LDLCALC, TRIG, CHOLHDL, LDLDIRECT in the last 72 hours. Thyroid function studies Recent Labs    12/04/20 0553  TSH 3.044   Anemia work up No results for input(s): VITAMINB12, FOLATE, FERRITIN, TIBC, IRON, RETICCTPCT in the last 72 hours. Urinalysis    Component Value Date/Time   COLORURINE YELLOW 09/25/2019 2039   APPEARANCEUR CLEAR 09/25/2019 2039   LABSPEC 1.015 09/25/2019 2039   PHURINE 5.0 09/25/2019 2039   GLUCOSEU NEGATIVE 09/25/2019 2039   HGBUR NEGATIVE 09/25/2019 2039   BILIRUBINUR NEGATIVE 09/25/2019 2039   KETONESUR NEGATIVE 09/25/2019 2039   PROTEINUR NEGATIVE 09/25/2019 2039   NITRITE NEGATIVE 09/25/2019 2039   LEUKOCYTESUR MODERATE (A) 09/25/2019 2039   Sepsis Labs Invalid input(s):  PROCALCITONIN,  WBC,  LACTICIDVEN Microbiology Recent Results (from the past 240 hour(s))   Resp Panel by RT-PCR (Flu A&B, Covid) Nasopharyngeal Swab     Status: Abnormal   Collection Time: 12/03/20 10:43 PM   Specimen: Nasopharyngeal Swab; Nasopharyngeal(NP) swabs in vial transport medium  Result Value Ref Range Status   SARS Coronavirus 2 by RT PCR POSITIVE (A) NEGATIVE Final    Comment: RESULT CALLED TO, READ BACK BY AND VERIFIED WITH: RN J. Skip MayerJADIER [id number] FCP (NOTE) SARS-CoV-2 target nucleic acids are DETECTED.  The SARS-CoV-2 RNA is generally detectable in upper respiratory specimens during the acute phase of infection. Positive results are indicative of the presence of the identified virus, but do not rule out bacterial infection or co-infection with other pathogens not detected by the test. Clinical correlation with patient history and other diagnostic information is necessary to determine patient infection status. The expected result is Negative.  Fact Sheet for Patients: BloggerCourse.comhttps://www.fda.gov/media/152166/download  Fact Sheet for Healthcare Providers: SeriousBroker.ithttps://www.fda.gov/media/152162/download  This test is not yet approved or cleared by the [place] FDA and  has been authorized for detection and/or diagnosis of SARS-CoV-2 by FDA under an Emergency Use Authorization (EUA).  This EUA will remain in effect (meaning this test can be used ) for the duration of  the COVID-19 declaration under Section 564(b)(1) of the Act, 21 U.S.C. section 360bbb-3(b)(1), unless the authorization is terminated or revoked sooner.     Influenza A by PCR NEGATIVE NEGATIVE Final   Influenza B by PCR NEGATIVE NEGATIVE Final    Comment: (NOTE) The Xpert Xpress SARS-CoV-2/FLU/RSV plus assay is intended as an aid in the diagnosis of influenza from Nasopharyngeal swab specimens and should not be used as a sole basis for treatment. Nasal washings and aspirates are unacceptable for Xpert Xpress SARS-CoV-2/FLU/RSV testing.  Fact Sheet for  Patients: BloggerCourse.comhttps://www.fda.gov/media/152166/download  Fact Sheet for Healthcare Providers: SeriousBroker.ithttps://www.fda.gov/media/152162/download  This test is not yet approved or cleared by the [place] FDA and has been authorized for detection and/or diagnosis of SARS-CoV-2 by FDA under an Emergency Use Authorization (EUA). This EUA will remain in effect (meaning this test can be used) for the duration of the COVID-19 declaration under Section 564(b)(1) of the Act, 21 U.S.C. section 360bbb-3(b)(1), unless the authorization is terminated or revoked.  Performed at [facility], [address] [place], [place] [id number]      Time coordinating discharge:  40 minutes  SIGNED:   Dorcas CarrowKuber Ethelene Closser, MD  [facility] 12/05/2020, 12:36 PM

## 2020-12-05 NOTE — Progress Notes (Signed)
Please see in addition to PT note:   SATURATION QUALIFICATIONS: (This note is used to comply with regulatory documentation for home oxygen)  Patient Saturations on Room Air at Rest = 93%  Patient Saturations on Room Air while Ambulating = 86%  Patient Saturations on 2 Liters of oxygen while Ambulating = DNT, able to recover back to 90s on room air   Please briefly explain why patient needs home oxygen:N/A- occasionally drops to 86% on room air but able to recover back up to 90s with rest/PLB without difficulty  Madelaine Etienne, DPT, PN1   Supplemental Physical Therapist [facility]    Pager [phone] Acute Rehab Office [phone]

## 2020-12-05 NOTE — TOC Initial Note (Addendum)
Transition of Care [facility]) - Initial/Assessment Note    Patient Details  Name: [name] MRN: [id number] Date of Birth: 14-Feb-1931  Transition of Care [facility]) CM/SW Contact:    Lockie Pares, RN Phone Number: 12/05/2020, 12:50 PM  Clinical Narrative:                 87 YO admiitted for cough SHOB and COVID pneumonia Lives at home with husband. They have 2 walkers and a cane at home for DME> oxygen qualifications done by PT, does not need home oyygen at this point. Will likely need home health PT/OT. CM will follow for needs 1300 Patient declining  home health. Spoke to both her and her husband. No further needs identified.   Expected Discharge Plan: Home w Home Health Services Barriers to Discharge: Continued Medical Work up   Patient Goals and CMS Choice        Expected Discharge Plan and Services Expected Discharge Plan: Home w Home Health Services   Discharge Planning Services: CM Consult   Living arrangements for the past 2 months: Single Family Home Expected Discharge Date: 12/05/20                                    Prior Living Arrangements/Services Living arrangements for the past 2 months: Single Family Home Lives with:: Spouse Patient language and need for interpreter reviewed:: Yes        Need for Family Participation in Patient Care: Yes (Comment) Care giver support system in place?: Yes (comment) Current home services: DME (has walker and cane) Criminal Activity/Legal Involvement Pertinent to Current Situation/Hospitalization: No - Comment as needed  Activities of Daily Living Home Assistive Devices/Equipment: Walker (specify type),Eyeglasses,Cane (specify quad or straight) ADL Screening (condition at time of admission) Patient's cognitive ability adequate to safely complete daily activities?: No Is the patient deaf or have difficulty hearing?: No Does the patient have difficulty seeing, even when wearing glasses/contacts?: No Does the patient  have difficulty concentrating, remembering, or making decisions?: Yes Patient able to express need for assistance with ADLs?: Yes Does the patient have difficulty dressing or bathing?: No Independently performs ADLs?: Yes (appropriate for developmental age) Does the patient have difficulty walking or climbing stairs?: Yes Weakness of Legs: None Weakness of Arms/Hands: None  Permission Sought/Granted                  Emotional Assessment   Attitude/Demeanor/Rapport: Guarded Affect (typically observed): Blunt Orientation: : Oriented to Self,Oriented to Place Alcohol / Substance Use: Not Applicable Psych Involvement: No (comment)  Admission diagnosis:  Pneumonia [J18.9] Community acquired pneumonia of left lower lobe of lung [J18.9] Pneumonia due to COVID-19 virus [U07.1, J12.82] Patient Active Problem List   Diagnosis Date Noted  . COVID-19 virus infection 12/04/2020  . Pneumonia due to COVID-19 virus 12/04/2020  . Dementia without behavioral disturbance (HCC)   . Pneumonia 12/03/2020  . Chronic kidney disease, stage 3b (HCC) 12/03/2020  . Chronic diastolic CHF (congestive heart failure) (HCC) 12/03/2020  . Enteritis due to Clostridium difficile 08/18/2014  . Hypomagnesemia 08/18/2014  . Diarrhea 08/17/2014  . Dehydration 08/17/2014  . HYPERLIPIDEMIA-MIXED 04/07/2010  . CARDIOMYOPATHY, ISCHEMIC 09/27/2009  . DIABETES MELLITUS, TYPE II 06/26/2009  . GOUT, UNSPECIFIED 06/26/2009  . Obesity 06/26/2009  . LOW BACK PAIN, CHRONIC 06/26/2009  . HYPERTENSION, BENIGN 06/04/2009  . CORONARY ATHEROSCLEROSIS NATIVE CORONARY ARTERY 06/04/2009  . CHEST PAIN, PRECORDIAL 06/04/2009  PCP:  Rodrigo Ran, MD Pharmacy:   [facility] [phone] - [place], [place] - [phone] AT [facility] [address] & [address] [place] [address] Phone: [phone] Fax: [phone]     Social Determinants of Health (SDOH) Interventions    Readmission  Risk Interventions No flowsheet data found.

## 2020-12-06 ENCOUNTER — Other Ambulatory Visit: Payer: Self-pay

## 2020-12-06 ENCOUNTER — Ambulatory Visit: Payer: Medicare Other

## 2020-12-06 VITALS — BP 121/81 | HR 91 | Temp 97.8°F

## 2020-12-06 DIAGNOSIS — U071 COVID-19: Secondary | ICD-10-CM

## 2020-12-06 NOTE — Progress Notes (Signed)
Pt in for Remdesivir infusion. Multiple attempts made by 2 nurses and unsuccessful. Pt stated her husband in car waiting, and he is not well himself, and she "just wants to go home". Declined any further attempts. Pharmacist made aware. Med wasted. Spouse made aware as well.

## 2020-12-23 ENCOUNTER — Encounter (HOSPITAL_COMMUNITY): Payer: Self-pay | Admitting: Emergency Medicine

## 2020-12-23 ENCOUNTER — Emergency Department (HOSPITAL_COMMUNITY): Payer: Medicare Other

## 2020-12-23 ENCOUNTER — Inpatient Hospital Stay (HOSPITAL_COMMUNITY)
Admission: EM | Admit: 2020-12-23 | Discharge: 2020-12-31 | DRG: 280 | Disposition: A | Discharge status: Hospice | Payer: Medicare Other | Attending: Internal Medicine | Admitting: Internal Medicine

## 2020-12-23 DIAGNOSIS — F0390 Unspecified dementia without behavioral disturbance: Secondary | ICD-10-CM | POA: Diagnosis present

## 2020-12-23 DIAGNOSIS — I10 Essential (primary) hypertension: Secondary | ICD-10-CM | POA: Diagnosis not present

## 2020-12-23 DIAGNOSIS — I214 Non-ST elevation (NSTEMI) myocardial infarction: Secondary | ICD-10-CM | POA: Diagnosis present

## 2020-12-23 DIAGNOSIS — I5043 Acute on chronic combined systolic (congestive) and diastolic (congestive) heart failure: Secondary | ICD-10-CM | POA: Diagnosis present

## 2020-12-23 DIAGNOSIS — R7989 Other specified abnormal findings of blood chemistry: Secondary | ICD-10-CM | POA: Diagnosis present

## 2020-12-23 DIAGNOSIS — Z8249 Family history of ischemic heart disease and other diseases of the circulatory system: Secondary | ICD-10-CM | POA: Diagnosis not present

## 2020-12-23 DIAGNOSIS — J811 Chronic pulmonary edema: Secondary | ICD-10-CM

## 2020-12-23 DIAGNOSIS — Z66 Do not resuscitate: Secondary | ICD-10-CM | POA: Diagnosis present

## 2020-12-23 DIAGNOSIS — I252 Old myocardial infarction: Secondary | ICD-10-CM

## 2020-12-23 DIAGNOSIS — D696 Thrombocytopenia, unspecified: Secondary | ICD-10-CM | POA: Diagnosis present

## 2020-12-23 DIAGNOSIS — N189 Chronic kidney disease, unspecified: Secondary | ICD-10-CM | POA: Diagnosis not present

## 2020-12-23 DIAGNOSIS — I130 Hypertensive heart and chronic kidney disease with heart failure and stage 1 through stage 4 chronic kidney disease, or unspecified chronic kidney disease: Principal | ICD-10-CM | POA: Diagnosis present

## 2020-12-23 DIAGNOSIS — R778 Other specified abnormalities of plasma proteins: Secondary | ICD-10-CM | POA: Diagnosis present

## 2020-12-23 DIAGNOSIS — Z515 Encounter for palliative care: Secondary | ICD-10-CM | POA: Diagnosis not present

## 2020-12-23 DIAGNOSIS — R0602 Shortness of breath: Secondary | ICD-10-CM | POA: Diagnosis not present

## 2020-12-23 DIAGNOSIS — I959 Hypotension, unspecified: Secondary | ICD-10-CM | POA: Diagnosis present

## 2020-12-23 DIAGNOSIS — J9611 Chronic respiratory failure with hypoxia: Secondary | ICD-10-CM | POA: Diagnosis present

## 2020-12-23 DIAGNOSIS — Z8616 Personal history of COVID-19: Secondary | ICD-10-CM

## 2020-12-23 DIAGNOSIS — E785 Hyperlipidemia, unspecified: Secondary | ICD-10-CM | POA: Diagnosis not present

## 2020-12-23 DIAGNOSIS — I5033 Acute on chronic diastolic (congestive) heart failure: Secondary | ICD-10-CM | POA: Diagnosis not present

## 2020-12-23 DIAGNOSIS — I472 Ventricular tachycardia: Secondary | ICD-10-CM | POA: Diagnosis present

## 2020-12-23 DIAGNOSIS — D539 Nutritional anemia, unspecified: Secondary | ICD-10-CM | POA: Diagnosis present

## 2020-12-23 DIAGNOSIS — N179 Acute kidney failure, unspecified: Secondary | ICD-10-CM | POA: Diagnosis present

## 2020-12-23 DIAGNOSIS — I491 Atrial premature depolarization: Secondary | ICD-10-CM | POA: Diagnosis not present

## 2020-12-23 DIAGNOSIS — I429 Cardiomyopathy, unspecified: Secondary | ICD-10-CM | POA: Diagnosis present

## 2020-12-23 DIAGNOSIS — Z823 Family history of stroke: Secondary | ICD-10-CM

## 2020-12-23 DIAGNOSIS — M109 Gout, unspecified: Secondary | ICD-10-CM | POA: Diagnosis present

## 2020-12-23 DIAGNOSIS — I5021 Acute systolic (congestive) heart failure: Secondary | ICD-10-CM | POA: Diagnosis not present

## 2020-12-23 DIAGNOSIS — Z7982 Long term (current) use of aspirin: Secondary | ICD-10-CM | POA: Diagnosis not present

## 2020-12-23 DIAGNOSIS — E119 Type 2 diabetes mellitus without complications: Secondary | ICD-10-CM | POA: Diagnosis not present

## 2020-12-23 DIAGNOSIS — E1122 Type 2 diabetes mellitus with diabetic chronic kidney disease: Secondary | ICD-10-CM | POA: Diagnosis present

## 2020-12-23 DIAGNOSIS — R54 Age-related physical debility: Secondary | ICD-10-CM | POA: Diagnosis present

## 2020-12-23 DIAGNOSIS — I2510 Atherosclerotic heart disease of native coronary artery without angina pectoris: Secondary | ICD-10-CM | POA: Diagnosis present

## 2020-12-23 DIAGNOSIS — Z955 Presence of coronary angioplasty implant and graft: Secondary | ICD-10-CM | POA: Diagnosis not present

## 2020-12-23 DIAGNOSIS — Z79899 Other long term (current) drug therapy: Secondary | ICD-10-CM

## 2020-12-23 DIAGNOSIS — I509 Heart failure, unspecified: Secondary | ICD-10-CM | POA: Diagnosis not present

## 2020-12-23 DIAGNOSIS — J90 Pleural effusion, not elsewhere classified: Secondary | ICD-10-CM | POA: Diagnosis not present

## 2020-12-23 DIAGNOSIS — N1832 Chronic kidney disease, stage 3b: Secondary | ICD-10-CM | POA: Diagnosis present

## 2020-12-23 DIAGNOSIS — Z7189 Other specified counseling: Secondary | ICD-10-CM | POA: Diagnosis not present

## 2020-12-23 DIAGNOSIS — E875 Hyperkalemia: Secondary | ICD-10-CM | POA: Diagnosis present

## 2020-12-23 DIAGNOSIS — R0902 Hypoxemia: Secondary | ICD-10-CM | POA: Diagnosis not present

## 2020-12-23 DIAGNOSIS — Z8701 Personal history of pneumonia (recurrent): Secondary | ICD-10-CM | POA: Diagnosis not present

## 2020-12-23 DIAGNOSIS — Z9981 Dependence on supplemental oxygen: Secondary | ICD-10-CM

## 2020-12-23 DIAGNOSIS — J189 Pneumonia, unspecified organism: Secondary | ICD-10-CM | POA: Diagnosis not present

## 2020-12-23 DIAGNOSIS — I110 Hypertensive heart disease with heart failure: Secondary | ICD-10-CM | POA: Diagnosis not present

## 2020-12-23 LAB — BASIC METABOLIC PANEL
Anion gap: 12 (ref 5–15)
Anion gap: 10 (ref 5–15)
BUN: 34 mg/dL — ABNORMAL HIGH (ref 8–23)
BUN: 33 mg/dL — ABNORMAL HIGH (ref 8–23)
CO2: 23 mmol/L (ref 22–32)
CO2: 25 mmol/L (ref 22–32)
Calcium: 8.6 mg/dL — ABNORMAL LOW (ref 8.9–10.3)
Calcium: 8.5 mg/dL — ABNORMAL LOW (ref 8.9–10.3)
Chloride: 108 mmol/L (ref 98–111)
Chloride: 109 mmol/L (ref 98–111)
Creatinine, Ser: 1.99 mg/dL — ABNORMAL HIGH (ref 0.44–1.00)
Creatinine, Ser: 1.91 mg/dL — ABNORMAL HIGH (ref 0.44–1.00)
GFR, Estimated: 24 mL/min — ABNORMAL LOW (ref >60)
GFR, Estimated: 25 mL/min — ABNORMAL LOW (ref >60)
Glucose, Bld: 132 mg/dL — ABNORMAL HIGH (ref 70–99)
Glucose, Bld: 110 mg/dL — ABNORMAL HIGH (ref 70–99)
Potassium: 5.5 mmol/L — ABNORMAL HIGH (ref 3.5–5.1)
Potassium: 4.8 mmol/L (ref 3.5–5.1)
Sodium: 143 mmol/L (ref 135–145)
Sodium: 144 mmol/L (ref 135–145)

## 2020-12-23 LAB — CBC WITH DIFFERENTIAL/PLATELET
Abs Immature Granulocytes: 0.01 10*3/uL (ref 0.00–0.07)
Basophils Absolute: 0 10*3/uL (ref 0.0–0.1)
Basophils Relative: 0 %
Eosinophils Absolute: 0.1 10*3/uL (ref 0.0–0.5)
Eosinophils Relative: 1 %
HCT: 34.8 % — ABNORMAL LOW (ref 36.0–46.0)
Hemoglobin: 11 g/dL — ABNORMAL LOW (ref 12.0–15.0)
Immature Granulocytes: 0 %
Lymphocytes Relative: 14 %
Lymphs Abs: 0.9 10*3/uL (ref 0.7–4.0)
MCH: 34.3 pg — ABNORMAL HIGH (ref 26.0–34.0)
MCHC: 31.6 g/dL (ref 30.0–36.0)
MCV: 108.4 fL — ABNORMAL HIGH (ref 80.0–100.0)
Monocytes Absolute: 0.4 10*3/uL (ref 0.1–1.0)
Monocytes Relative: 6 %
Neutro Abs: 5.3 10*3/uL (ref 1.7–7.7)
Neutrophils Relative %: 79 %
Platelets: 122 10*3/uL — ABNORMAL LOW (ref 150–400)
RBC: 3.21 MIL/uL — ABNORMAL LOW (ref 3.87–5.11)
RDW: 14.6 % (ref 11.5–15.5)
WBC: 6.7 10*3/uL (ref 4.0–10.5)
nRBC: 0 % (ref 0.0–0.2)

## 2020-12-23 LAB — HEPATIC FUNCTION PANEL
ALT: 12 U/L (ref 0–44)
AST: 17 U/L (ref 15–41)
Albumin: 3.7 g/dL (ref 3.5–5.0)
Alkaline Phosphatase: 83 U/L (ref 38–126)
Bilirubin, Direct: 0.1 mg/dL (ref 0.0–0.2)
Indirect Bilirubin: 0.4 mg/dL (ref 0.3–0.9)
Total Bilirubin: 0.5 mg/dL (ref 0.3–1.2)
Total Protein: 6.4 g/dL — ABNORMAL LOW (ref 6.5–8.1)

## 2020-12-23 LAB — URINALYSIS, COMPLETE (UACMP) WITH MICROSCOPIC
Bilirubin Urine: NEGATIVE
Glucose, UA: NEGATIVE mg/dL
Hgb urine dipstick: NEGATIVE
Ketones, ur: NEGATIVE mg/dL
Leukocytes,Ua: NEGATIVE
Nitrite: NEGATIVE
Protein, ur: NEGATIVE mg/dL
Specific Gravity, Urine: 1.009 (ref 1.005–1.030)
pH: 6 (ref 5.0–8.0)

## 2020-12-23 LAB — CREATININE, URINE, RANDOM: Creatinine, Urine: 38.3 mg/dL

## 2020-12-23 LAB — SODIUM, URINE, RANDOM: Sodium, Ur: 125 mmol/L

## 2020-12-23 LAB — GLUCOSE, CAPILLARY: Glucose-Capillary: 118 mg/dL — ABNORMAL HIGH (ref 70–99)

## 2020-12-23 LAB — MAGNESIUM: Magnesium: 1.5 mg/dL — ABNORMAL LOW (ref 1.7–2.4)

## 2020-12-23 LAB — TROPONIN I (HIGH SENSITIVITY)
Troponin I (High Sensitivity): 113 ng/L (ref <18)
Troponin I (High Sensitivity): 151 ng/L (ref <18)

## 2020-12-23 LAB — PROCALCITONIN: Procalcitonin: <0.1 ng/mL

## 2020-12-23 LAB — BRAIN NATRIURETIC PEPTIDE: B Natriuretic Peptide: 106.8 pg/mL — ABNORMAL HIGH (ref 0.0–100.0)

## 2020-12-23 MED ORDER — MAGNESIUM SULFATE 2 GM/50ML IV SOLN
2.0000 g | Freq: Once | INTRAVENOUS | Status: AC
  Administered 2020-12-23: 2 g via INTRAVENOUS
  Filled 2020-12-23: qty 50

## 2020-12-23 MED ORDER — INSULIN ASPART 100 UNIT/ML IJ SOLN
0.0000 [IU] | Freq: Three times a day (TID) | INTRAMUSCULAR | Status: DC
  Administered 2020-12-24: 1 [IU] via SUBCUTANEOUS
  Administered 2020-12-25: 2 [IU] via SUBCUTANEOUS
  Administered 2020-12-26 – 2020-12-31 (×4): 1 [IU] via SUBCUTANEOUS
  Filled 2020-12-23: qty 0.06

## 2020-12-23 MED ORDER — FUROSEMIDE 10 MG/ML IJ SOLN
40.0000 mg | Freq: Once | INTRAMUSCULAR | Status: AC
  Administered 2020-12-23: 40 mg via INTRAVENOUS
  Filled 2020-12-23: qty 4

## 2020-12-23 MED ORDER — ACETAMINOPHEN 650 MG RE SUPP: 650.0000 mg | Freq: Four times a day (QID) | RECTAL | Status: DC | PRN

## 2020-12-23 MED ORDER — IPRATROPIUM-ALBUTEROL 0.5-2.5 (3) MG/3ML IN SOLN: 3.0000 mL | Freq: Four times a day (QID) | RESPIRATORY_TRACT | Status: DC

## 2020-12-23 MED ORDER — PROCHLORPERAZINE EDISYLATE 10 MG/2ML IJ SOLN
5.0000 mg | Freq: Once | INTRAMUSCULAR | Status: AC
  Administered 2020-12-24: 5 mg via INTRAVENOUS
  Filled 2020-12-23: qty 2

## 2020-12-23 MED ORDER — ATORVASTATIN CALCIUM 40 MG PO TABS
80.0000 mg | ORAL_TABLET | Freq: Every day | ORAL | Status: DC
  Administered 2020-12-23 – 2020-12-30 (×8): 80 mg via ORAL
  Filled 2020-12-23 (×8): qty 2

## 2020-12-23 MED ORDER — FUROSEMIDE 10 MG/ML IJ SOLN: 20.0000 mg | Freq: Once | INTRAMUSCULAR | Status: DC

## 2020-12-23 MED ORDER — ASPIRIN EC 81 MG PO TBEC
81.0000 mg | DELAYED_RELEASE_TABLET | Freq: Every day | ORAL | Status: DC
  Administered 2020-12-24 – 2020-12-31 (×8): 81 mg via ORAL
  Filled 2020-12-23 (×8): qty 1

## 2020-12-23 MED ORDER — ACETAMINOPHEN 325 MG PO TABS
650.0000 mg | ORAL_TABLET | Freq: Four times a day (QID) | ORAL | Status: DC | PRN
  Administered 2020-12-25 (×2): 650 mg via ORAL
  Filled 2020-12-23 (×2): qty 2

## 2020-12-23 MED ORDER — FUROSEMIDE 10 MG/ML IJ SOLN: 20.0000 mg | Freq: Two times a day (BID) | INTRAMUSCULAR | Status: DC

## 2020-12-23 MED ORDER — ASPIRIN 81 MG PO CHEW
324.0000 mg | CHEWABLE_TABLET | Freq: Once | ORAL | Status: AC
  Administered 2020-12-23: 324 mg via ORAL
  Filled 2020-12-23: qty 4

## 2020-12-23 MED ORDER — IPRATROPIUM-ALBUTEROL 0.5-2.5 (3) MG/3ML IN SOLN
3.0000 mL | RESPIRATORY_TRACT | Status: DC | PRN
  Administered 2020-12-30 – 2020-12-31 (×3): 3 mL via RESPIRATORY_TRACT
  Filled 2020-12-23 (×3): qty 3

## 2020-12-23 MED ORDER — PANTOPRAZOLE SODIUM 40 MG PO TBEC: 40.0000 mg | DELAYED_RELEASE_TABLET | Freq: Every day | ORAL | Status: DC

## 2020-12-23 MED ORDER — FUROSEMIDE 10 MG/ML IJ SOLN
20.0000 mg | Freq: Two times a day (BID) | INTRAMUSCULAR | Status: DC
  Administered 2020-12-24 (×2): 20 mg via INTRAVENOUS
  Filled 2020-12-23 (×2): qty 2

## 2020-12-23 NOTE — ED Provider Notes (Signed)
[facility] EMERGENCY DEPARTMENT Provider Note  CSN: [id number] Arrival date & time: 12/23/20 1612    History Chief Complaint  Patient presents with   Shortness of Breath   Leg Swelling     Shortness of Breath  [name] is a 87 y.o. female with history of CAD/CHF was admitted for Covid pneumonia/hypoxia on 5/24, completed steroids and remdesivir, hypoxia improved and ultimately discharged home on 5/26. She was brought to the ED via EMS today for 3 days of increased SOB. She denies chest pain, fever, cough, orthopnea or DOE although she also has a history of dementia and may not remember details well.    Past Medical History:  Diagnosis Date   Chest pain    Coronary atherosclerosis of native coronary artery    NSTEMI 11/10, LCx stent   Diabetes mellitus    type II   Gout    Hypertension    Low back pain    chronic   Obesity     Past Surgical History:  Procedure Laterality Date   APPENDECTOMY     CORONARY STENT PLACEMENT      Family History  Problem Relation Age of Onset   Stroke Mother 92       deceased   Heart attack Father 75       deceased   Other Other        no CAD in siblings (3 brothers and 1 sister)    Social History   Tobacco Use   Smoking status: Never   Smokeless tobacco: Never  Substance Use Topics   Alcohol use: No   Drug use: No     Home Medications Prior to Admission medications   Medication Sig Start Date End Date Taking? Authorizing Provider  ACCU-CHEK AVIVA PLUS test strip  01/11/15   [provider]  aspirin 81 MG tablet Take 81 mg by mouth daily.    [provider]  atorvastatin (LIPITOR) 80 MG tablet Take 80 mg by mouth at bedtime.    [provider]  febuxostat (ULORIC) 40 MG tablet Take 40 mg by mouth daily.    [provider]  ferrous sulfate 325 (65 FE) MG tablet Take 325 mg by mouth at bedtime.    [provider]  furosemide (LASIX) 40 MG tablet Take 40 mg by mouth daily.  07/30/19   [provider]  Lancets (ACCU-CHEK MULTICLIX) lancets Use as directed to check your blood sugar 01/11/15   [provider]  losartan (COZAAR) 100 MG tablet Take 100 mg by mouth daily.  02/26/15   [provider]  pantoprazole (PROTONIX) 40 MG tablet Take 40 mg by mouth daily. 07/08/19   [provider]  rivastigmine (EXELON) 4.6 mg/24hr Place 4.6 mg onto the skin daily.    [provider]  Vitamin D, Ergocalciferol, (DRISDOL) 1.25 MG (50000 UNIT) CAPS capsule Take 50,000 Units by mouth once a week. 07/30/19   [provider]     Allergies    Allopurinol and Sulfonamide derivatives   Review of Systems   Review of Systems  Respiratory:  Positive for shortness of breath.   Unable to fully assess due to mental status.    Physical Exam BP (!) 134/98   Pulse (!) 115   Temp 98.2 F (36.8 C) (Oral)   Resp (!) 28   SpO2 97%   Physical Exam Vitals and nursing note reviewed.  Constitutional:      Appearance: Normal appearance.  HENT:  Head: Normocephalic and atraumatic.     Nose: Nose normal.     Mouth/Throat:     Mouth: Mucous membranes are moist.  Eyes:     Extraocular Movements: Extraocular movements intact.     Conjunctiva/sclera: Conjunctivae normal.  Cardiovascular:     Rate and Rhythm: Regular rhythm. Tachycardia present.  Pulmonary:     Effort: Pulmonary effort is normal. Tachypnea present.     Breath sounds: Examination of the right-lower field reveals rales. Examination of the left-lower field reveals rales. Rales present.  Abdominal:     General: Abdomen is flat.     Palpations: Abdomen is soft.     Tenderness: There is no abdominal tenderness.  Musculoskeletal:        General: No swelling. Normal range of motion.     Cervical back: Neck supple.     Right lower leg: Edema (trace pedal) present.     Left lower leg: Edema (trace pedal) present.  Skin:    General: Skin is warm and dry.  Neurological:      General: No focal deficit present.     Mental Status: She is alert.  Psychiatric:        Mood and Affect: Mood normal.     ED Results / Procedures / Treatments   Labs (all labs ordered are listed, but only abnormal results are displayed) Labs Reviewed  BASIC METABOLIC PANEL - Abnormal; Notable for the following components:      Result Value   Potassium 5.5 (*)    Glucose, Bld 132 (*)    BUN 34 (*)    Creatinine, Ser 1.99 (*)    Calcium 8.6 (*)    GFR, Estimated 24 (*)    All other components within normal limits  BRAIN NATRIURETIC PEPTIDE - Abnormal; Notable for the following components:   B Natriuretic Peptide 106.8 (*)    All other components within normal limits  CBC WITH DIFFERENTIAL/PLATELET - Abnormal; Notable for the following components:   RBC 3.21 (*)    Hemoglobin 11.0 (*)    HCT 34.8 (*)    MCV 108.4 (*)    MCH 34.3 (*)    Platelets 122 (*)    All other components within normal limits  TROPONIN I (HIGH SENSITIVITY) - Abnormal; Notable for the following components:   Troponin I (High Sensitivity) 113 (*)    All other components within normal limits  PHOSPHORUS  MAGNESIUM  MAGNESIUM  COMPREHENSIVE METABOLIC PANEL  CBC  TROPONIN I (HIGH SENSITIVITY)    EKG EKG Interpretation  Date/Time:  Monday December 23 2020 16:51:52 EDT Ventricular Rate:  117 PR Interval:  145 QRS Duration: 114 QT Interval:  353 QTC Calculation: 493 R Axis:   -65 Text Interpretation: Sinus tachycardia Left anterior fascicular block Consider anterior infarct Nonspecific T abnormalities, lateral leads No significant change since last tracing Confirmed by Susy Frizzle [phone]) on 12/23/2020 5:01:07 PM  Radiology DG Chest Port 1 View  Result Date: 12/23/2020 CLINICAL DATA:  Shortness of breath EXAM: PORTABLE CHEST 1 VIEW COMPARISON:  12/03/2020 FINDINGS: The heart and mediastinal contours are within normal limits. Diffuse bilateral interstitial pulmonary opacity and bilateral  pleural effusions, increased compared to prior examination. IMPRESSION: Diffuse bilateral interstitial pulmonary opacity and bilateral pleural effusions, increased compared to prior examination, concerning for pulmonary edema. Electronically Signed   By: Lauralyn Primes M.D.   On: 12/23/2020 17:12    Procedures Procedures  Medications Ordered in the ED Medications  acetaminophen (TYLENOL) tablet 650 mg (  has no administration in time range)    Or  acetaminophen (TYLENOL) suppository 650 mg (has no administration in time range)  aspirin chewable tablet 324 mg (324 mg Oral Given 12/23/20 1808)  furosemide (LASIX) injection 40 mg (40 mg Intravenous Given 12/23/20 1804)     MDM Rules/Calculators/A&P MDM  Patient with history of CHF/CAD here with SOB and leg swelling. Details are unclear due to dementia (she isn't sure who called EMS or how she got here). She was hospitalized for COVID/hypoxia about 3 weeks ago. Will check labs, EKG and CXR. EMS reported SpO2 92% on RA, placed on  [place] with improvement to 100% although remains tachycardic and tachypneic. She does not have fever or other infectious symptoms to suggest sepsis.    ED Course  I have reviewed the triage vital signs and the nursing notes.  Pertinent labs & imaging results that were available during my care of the patient were reviewed by me and considered in my medical decision making (see chart for details).  Clinical Course as of 12/23/20 1904  Mon Dec 23, 2020  1731 CXR concerning for pulm edema. CBC with mild anemia, otherwise unremarkable.  [CS]  1800 BMP is mildly elevated. Trop is also high, will give ASA, Lasix [CS]  1806 BMP with mild increase in Cr from recent baseline.  [CS]  1846 Spoke with Dr. Arlean Hopping, Hospitalist, who will evaluate for admission.  [CS]    Clinical Course User Index [CS] Pollyann Savoy, MD    Final Clinical Impression(s) / ED Diagnoses Final diagnoses:  Acute congestive heart failure, unspecified  heart failure type (HCC)  Elevated troponin    Rx / DC Orders ED Discharge Orders     None        Pollyann Savoy, MD 12/23/20 1904

## 2020-12-23 NOTE — ED Notes (Signed)
ED TO INPATIENT HANDOFF REPORT  Name/Age/Gender [name] 87 y.o. female  Code Status    Code Status Orders  (From admission, onward)         Start     Ordered   12/23/20 2031  Do not attempt resuscitation (DNR)  Continuous       Question Answer Comment  In the event of cardiac or respiratory ARREST Do not call a "code blue"   In the event of cardiac or respiratory ARREST Do not perform Intubation, CPR, defibrillation or ACLS   In the event of cardiac or respiratory ARREST Use medication by any route, position, wound care, and other measures to relive pain and suffering. May use oxygen, suction and manual treatment of airway obstruction as needed for comfort.      12/23/20 2032        Code Status History    Date Active Date Inactive Code Status Order ID Comments User Context   12/23/2020 1912 12/23/2020 2032 DNR [id number]  Angie Fava, DO ED   12/23/2020 1857 12/23/2020 1912 Full Code [id number]  Angie Fava, DO ED   12/04/2020 0045 12/05/2020 2011 Full Code [id number]  Briscoe Deutscher, MD ED   08/17/2014 1714 08/21/2014 1827 Full Code [id number]  Leroy Sea, MD ED      Home/SNF/Other Home  Chief Complaint Acute on chronic diastolic (congestive) heart failure (HCC) [I50.33]  Level of Care/Admitting Diagnosis ED Disposition    ED Disposition  Admit   Condition  --   Comment  Hospital Area: [facility]  HOSPITAL [[id number]]  Level of Care: Progressive [102]  Admit to Progressive based on following criteria: CARDIOVASCULAR & THORACIC of moderate stability with acute coronary syndrome symptoms/low risk myocardial infarction/hypertensive urgency/arrhythmias/heart failure potentially compromising stability and stable post cardiovascular intervention patients.  May admit patient to [facility] or [facility] if equivalent level of care is available:: Yes  Covid Evaluation: Recent COVID positive no isolation required infection day 21-90  Diagnosis: Acute on  chronic diastolic (congestive) heart failure [facility]) [[id number]]  Admitting Physician: Angie Fava [[id number]]  Attending Physician: Angie Fava [[id number]]  Estimated length of stay: past midnight tomorrow  Certification:: I certify this patient will need inpatient services for at least 2 midnights         Medical History Past Medical History:  Diagnosis Date  . Chest pain   . Coronary atherosclerosis of native coronary artery    NSTEMI 11/10, LCx stent  . Diabetes mellitus    type II  . Gout   . Hypertension   . Low back pain    chronic  . Obesity     Allergies Allergies  Allergen Reactions  . Allopurinol Hives  . Sulfonamide Derivatives Hives    IV Location/Drains/Wounds Patient Lines/Drains/Airways Status    Active Line/Drains/Airways    Name Placement date Placement time Site Days   Peripheral IV 12/23/20 20 G 1" Left Antecubital 12/23/20  1700  Antecubital  less than 1          Labs/Imaging Results for orders placed or performed during the hospital encounter of 12/23/20 (from the past 48 hour(s))  Basic metabolic panel     Status: Abnormal   Collection Time: 12/23/20  4:48 PM  Result Value Ref Range   Sodium 143 135 - 145 mmol/L   Potassium 5.5 (H) 3.5 - 5.1 mmol/L    Comment: MODERATE HEMOLYSIS   Chloride 108 98 - 111 mmol/L   CO2 23  22 - 32 mmol/L   Glucose, Bld 132 (H) 70 - 99 mg/dL    Comment: Glucose reference range applies only to samples taken after fasting for at least 8 hours.   BUN 34 (H) 8 - 23 mg/dL   Creatinine, Ser 4.541.99 (H) 0.44 - 1.00 mg/dL   Calcium 8.6 (L) 8.9 - 10.3 mg/dL   GFR, Estimated 24 (L) >60 mL/min    Comment: (NOTE) Calculated using the CKD-EPI Creatinine Equation (2021)    Anion gap 12 5 - 15    Comment: Performed at [facility], [address] [place], [place] [address]  Brain natriuretic peptide     Status: Abnormal   Collection Time: 12/23/20  4:48 PM  Result Value Ref Range   B  Natriuretic Peptide 106.8 (H) 0.0 - 100.0 pg/mL    Comment: Performed at [facility], [address] [place], [place] [address]  Troponin I (High Sensitivity)     Status: Abnormal   Collection Time: 12/23/20  4:48 PM  Result Value Ref Range   Troponin I (High Sensitivity) 113 (HH) <18 ng/L    Comment: CRITICAL RESULT CALLED TO, READ BACK BY AND VERIFIED WITH: A.WOODY, RN AT 1758 ON 06.13.22 BY N.THOMPSON (NOTE) Elevated high sensitivity troponin I (hsTnI) values and significant  changes across serial measurements may suggest ACS but many other  chronic and acute conditions are known to elevate hsTnI results.  Refer to the Links section for chest pain algorithms and additional  guidance. Performed at [facility], [address] [place], [place] [address]   CBC with Differential     Status: Abnormal   Collection Time: 12/23/20  4:48 PM  Result Value Ref Range   WBC 6.7 4.0 - 10.5 K/uL   RBC 3.21 (L) 3.87 - 5.11 MIL/uL   Hemoglobin 11.0 (L) 12.0 - 15.0 g/dL   HCT 13.034.8 (L) 86.536.0 - 78.446.0 %   MCV 108.4 (H) 80.0 - 100.0 fL   MCH 34.3 (H) 26.0 - 34.0 pg   MCHC 31.6 30.0 - 36.0 g/dL   RDW 69.614.6 29.511.5 - 28.415.5 %   Platelets 122 (L) 150 - 400 K/uL    Comment: Immature Platelet Fraction may be clinically indicated, consider ordering this additional test [id number]    nRBC 0.0 0.0 - 0.2 %   Neutrophils Relative % 79 %   Neutro Abs 5.3 1.7 - 7.7 K/uL   Lymphocytes Relative 14 %   Lymphs Abs 0.9 0.7 - 4.0 K/uL   Monocytes Relative 6 %   Monocytes Absolute 0.4 0.1 - 1.0 K/uL   Eosinophils Relative 1 %   Eosinophils Absolute 0.1 0.0 - 0.5 K/uL   Basophils Relative 0 %   Basophils Absolute 0.0 0.0 - 0.1 K/uL   Immature Granulocytes 0 %   Abs Immature Granulocytes 0.01 0.00 - 0.07 K/uL    Comment: Performed at [facility], [address] [place], [place] [address]  Troponin I (High Sensitivity)     Status: Abnormal   Collection  Time: 12/23/20  6:48 PM  Result Value Ref Range   Troponin I (High Sensitivity) 151 (HH) <18 ng/L    Comment: CRITICAL VALUE NOTED.  VALUE IS CONSISTENT WITH PREVIOUSLY REPORTED AND CALLED VALUE. (NOTE) Elevated high sensitivity troponin I (hsTnI) values and significant  changes across serial measurements may suggest ACS but many other  chronic and acute conditions are known to elevate hsTnI results.  Refer to the Links  section for chest pain algorithms and additional  guidance. Performed at [facility], [address], [place], [place] [address]   Magnesium     Status: Abnormal   Collection Time: 12/23/20  6:48 PM  Result Value Ref Range   Magnesium 1.5 (L) 1.7 - 2.4 mg/dL    Comment: Performed at [facility], [address] [place], [place] [address]  Urinalysis, Complete w Microscopic     Status: Abnormal   Collection Time: 12/23/20  7:38 PM  Result Value Ref Range   Color, Urine STRAW (A) YELLOW   APPearance CLEAR CLEAR   Specific Gravity, Urine 1.009 1.005 - 1.030   pH 6.0 5.0 - 8.0   Glucose, UA NEGATIVE NEGATIVE mg/dL   Hgb urine dipstick NEGATIVE NEGATIVE   Bilirubin Urine NEGATIVE NEGATIVE   Ketones, ur NEGATIVE NEGATIVE mg/dL   Protein, ur NEGATIVE NEGATIVE mg/dL   Nitrite NEGATIVE NEGATIVE   Leukocytes,Ua NEGATIVE NEGATIVE   RBC / HPF 0-5 0 - 5 RBC/hpf   WBC, UA 0-5 0 - 5 WBC/hpf   Bacteria, UA RARE (A) NONE SEEN   Squamous Epithelial / LPF 0-5 0 - 5    Comment: Performed at [facility], [address] [place], [place] [address]   DG Chest Port 1 View  Result Date: 12/23/2020 CLINICAL DATA:  Shortness of breath EXAM: PORTABLE CHEST 1 VIEW COMPARISON:  12/03/2020 FINDINGS: The heart and mediastinal contours are within normal limits. Diffuse bilateral interstitial pulmonary opacity and bilateral pleural effusions, increased compared to prior examination. IMPRESSION: Diffuse bilateral interstitial pulmonary  opacity and bilateral pleural effusions, increased compared to prior examination, concerning for pulmonary edema. Electronically Signed   By: Lauralyn Primes M.D.   On: 12/23/2020 17:12    Pending Labs Unresulted Labs (From admission, onward)    Start     Ordered   12/24/20 0500  Phosphorus  Tomorrow morning,   R       Question:  Specimen collection method  Answer:  Lab=Lab collect   12/23/20 1857   12/24/20 0500  Magnesium  Tomorrow morning,   R       Question:  Specimen collection method  Answer:  Lab=Lab collect   12/23/20 1857   12/24/20 0500  Comprehensive metabolic panel  Tomorrow morning,   R       Question:  Specimen collection method  Answer:  Lab=Lab collect   12/23/20 1857   12/24/20 0500  CBC  Tomorrow morning,   R       Question:  Specimen collection method  Answer:  Lab=Lab collect   12/23/20 1857   12/23/20 2200  Basic metabolic panel  Once-Timed,   STAT       Question:  Specimen collection method  Answer:  Lab=Lab collect   12/23/20 1933   12/23/20 1938  Creatinine, urine, random  Add-on,   AD        12/23/20 1937   12/23/20 1938  Sodium, urine, random  Add-on,   AD        12/23/20 1937   12/23/20 1936  Hepatic function panel  Add-on,   AD        12/23/20 1935   12/23/20 1935  Procalcitonin - Baseline  Add-on,   AD        12/23/20 1934          Vitals/Pain Today's Vitals   12/23/20 1845 12/23/20 1930 12/23/20 2000 12/23/20 2030  BP:  134/86 134/83  133/89  Pulse: (!) 115 (!) 117 (!) 112 (!) 109  Resp: (!) 28 (!) 21 (!) 30 (!) 29  Temp:      TempSrc:      SpO2: 97% 98% 97% 98%  PainSc:        Isolation Precautions No active isolations  Medications Medications  acetaminophen (TYLENOL) tablet 650 mg (has no administration in time range)    Or  acetaminophen (TYLENOL) suppository 650 mg (has no administration in time range)  furosemide (LASIX) injection 20 mg (has no administration in time range)  furosemide (LASIX) injection 20 mg (has no administration  in time range)  aspirin tablet 81 mg (has no administration in time range)  atorvastatin (LIPITOR) tablet 80 mg (has no administration in time range)  pantoprazole (PROTONIX) EC tablet 40 mg (has no administration in time range)  insulin aspart (novoLOG) injection 0-6 Units (has no administration in time range)  aspirin chewable tablet 324 mg (324 mg Oral Given 12/23/20 1808)  furosemide (LASIX) injection 40 mg (40 mg Intravenous Given 12/23/20 1804)    Mobility non-ambulatory

## 2020-12-23 NOTE — H&P (Signed)
History and Physical    PLEASE NOTE THAT [employer]AG[employer] SOFTWARE WAS USED IN THE CONSTRUCTION OF THIS NOTE.   [name] NLZ:[id number] DOB: Jan 24, 1931 DOA: 12/23/2020  PCP: Rodrigo Ran, MD Patient coming from: home   I have personally briefly reviewed patient's old medical records in [facility]  Chief Complaint: Shortness of breath  HPI: [name] is a 87 y.o. female with medical history significant for coronary artery disease status post NSTEMI in 2010 status post drug-eluting stent x1 to left circumflex, chronic diastolic heart failure, type 2 diabetes mellitus, dementia, stage IIIb chronic kidney disease with baseline creatinine 1.5-1.8, essential pretension, hyperlipidemia, chronic iron deficiency anemia with baseline hemoglobin 10-11, recent hospitalization for COVID-19 infection who is admitted to [facility] on 12/23/2020 with acute on chronic diastolic heart failure after presenting from home to [facility] ED complaining of shortness of breath.   In the context of the patient's baseline dementia, the following history is provided via my discussions with the patient as well as my discussions with the patient's husband, who is present at bedside, in addition to my discussions with the emergency department physician, and via chart review.  The patient was recently hospitalized at [facility] from 12/03/20 - 12/05/20 for COVID-19 infection (positive covid test on 12/03/20) during which she received remdesivir.  Her hospitalization was associated with mild hypoxia, requiring nasal cannula, without the need for intubation.  Her supplemental oxygen was weaned down over the course of her hospitalization, and she was subsequently discharged to home in the absence of residual supplemental oxygen demands.  At the time of discharge, she had mild residual cough as well as mild residual shortness of breath that continued to improve over the ensuing 2 weeks following discharge from the hospital on  12/05/2020, patient's cough and shortness of breath continued to improve until 3 days ago at which time he developed progressive worsening of shortness of breath, which she states is much different than the shortness of breath she is experiencing during her COVID-19 infection.  Specifically, she reports that her presenting shortness of breath over the last 3 days is associated with orthopnea and new onset edema in the bilateral lower extremities over the last few days, which she confirms features that were not present she was experiencing shortness of breath with COVID-19 during her prior hospitalization.    Denies any associated chest pain.  Unsure as to her recent weight trend, she does not routinely check her weight at home.  Denies any associated subjective fever, chills, rigors, or generalized myalgias.  Not associate with any diaphoresis, palpitations, nausea, vomiting, presyncope, or syncope.  Not associate any wheezing, mopped assist, new lower extremity erythema, or calf tenderness.  No recent trauma or travel.  Denies any recent headache, neck stiffness, abdominal pain, or rash.  Denies any recent dysuria, gross materia, or change in urinary urgency/frequency  Medical history notable for coronary artery disease, with chart review revealing NSTEMI in 2010, at which time she received drug-eluting stent x1 to the left circumflex.  She also has a documented history of chronic diastolic heart failure, with most recent echocardiogram in 2011 demonstrating normal left ventricular cavity size, LVEF 50 to 55%, no evidence of focal wall motion abnormalities, grade 1 diastolic dysfunction, mild mitral regurgitation, and mildly dilated right atrium.  She is on Lasix 40 mg p.o. daily as her sole outpatient diuretic medication, with reported associated good compliance.  Outpatient medications are also notable for losartan, but not on beta-blocker at home.  Medical history also notable for stage IIIb chronic kidney  disease with baseline creatinine noted to be 1.5-1.8, with most recent prior creatinine found to be 1.83 on 12/05/2020.     ED Course:  Vital signs in the ED were notable for the following initial blood pressure 143/96, Tetramex 98.2, initial heart rate 120, with most recent heart rate 115 following administration of IV Lasix, as further quantified below; initial blood pressure 143/96, with most recent blood pressure 134/98; respiratory rate 22-28, initial oxygen saturation 92% on room air, prompting placement of 2 L nasal cannula, upon which the patient's ensuing oxygen saturation's been 97 to 98%.   Labs were notable for the following: BMP notable for the following: Sodium 143, potassium 5.5 relative to most recent prior value 4.6 on 12/05/2020, bicarbonate 23, BUN 34, creatinine 1.99 relative to most recent prior value of 1.83 on 12/05/2020, glucose 132.  BNP 107, high-sensitivity troponin I x1 found to be 113, with no prior high-sensitivity troponin high value available for point comparison.  CBC notable for low blood cell count 6700, hemoglobin 11 compared to most recent value 10 on 12/05/2020.  EKG today, by way of comparison to most recent prior from 12/04/2020, showed sinus tachycardia with heart rate 117, left anterior fascicular block, T wave inversion in aVL, which is unchanged from most recent prior EKG, and no evidence of ST changes, including no evidence of ST elevation.  Chest x-ray, in comparison to chest x-ray from 12/03/2020, showed interval development of diffuse bilateral airspace opacities with bilateral pleural effusions concerning for pulmonary edema without evidence of overt consolidation or pneumothorax.  While in the ED, the following were administered: Lasix 40 mg IV x1 at 1800.  Full dose aspirin x1.  Socially, the patient was admitted to the PCU for further evaluation and management presenting acute on chronic diastolic heart failure as well as close monitoring of ensuing renal  function in the setting of acute kidney injury superimposed on CKD 3B.     Review of Systems: As per HPI otherwise 10 point review of systems negative.   Past Medical History:  Diagnosis Date   Chest pain    Coronary atherosclerosis of native coronary artery    NSTEMI 11/10, LCx stent   Diabetes mellitus    type II   Gout    Hypertension    Low back pain    chronic   Obesity     Past Surgical History:  Procedure Laterality Date   APPENDECTOMY     CORONARY STENT PLACEMENT      Social History:  reports that she has never smoked. She has never used smokeless tobacco. She reports that she does not drink alcohol and does not use drugs.   Allergies  Allergen Reactions   Allopurinol Hives   Sulfonamide Derivatives Hives    Family History  Problem Relation Age of Onset   Stroke Mother 64       deceased   Heart attack Father 38       deceased   Other Other        no CAD in siblings (3 brothers and 1 sister)    Family history reviewed and not pertinent    Prior to Admission medications   Medication Sig Start Date End Date Taking? Authorizing Provider  ACCU-CHEK AVIVA PLUS test strip  01/11/15   [provider]  aspirin 81 MG tablet Take 81 mg by mouth daily.    [provider]  atorvastatin (LIPITOR) 80 MG  tablet Take 80 mg by mouth at bedtime.    [provider]  febuxostat (ULORIC) 40 MG tablet Take 40 mg by mouth daily.    [provider]  ferrous sulfate 325 (65 FE) MG tablet Take 325 mg by mouth at bedtime.    [provider]  furosemide (LASIX) 40 MG tablet Take 40 mg by mouth daily. 07/30/19   [provider]  Lancets (ACCU-CHEK MULTICLIX) lancets Use as directed to check your blood sugar 01/11/15   [provider]  losartan (COZAAR) 100 MG tablet Take 100 mg by mouth daily.  02/26/15   [provider]  pantoprazole (PROTONIX) 40 MG tablet Take 40 mg by mouth daily. 07/08/19   [provider]  rivastigmine (EXELON) 4.6 mg/24hr Place 4.6 mg onto the skin daily.    [provider]  Vitamin D, Ergocalciferol, (DRISDOL) 1.25 MG (50000 UNIT) CAPS capsule Take 50,000 Units by mouth once a week. 07/30/19   [provider]     Objective    Physical Exam: Vitals:   12/23/20 1645 12/23/20 1745 12/23/20 1815 12/23/20 1845  BP: (!) 150/59  (!) 134/98   Pulse: (!) 116 (!) 119 (!) 118 (!) 115  Resp: (!) 22 (!) 26 (!) 23 (!) 28  Temp:      TempSrc:      SpO2: 100% 98% 97% 97%    General: appears to be stated age; alert, oriented; mildly increased wob noted. Skin: warm, dry, no rash Head:  AT/[place] Mouth:  Oral mucosa membranes appear moist, normal dentition Neck: supple; trachea midline Heart: Mildly tachycardic, but regular; did not appreciate any M/R/G Lungs: Bibasilar crackles noted, but otherwise CTAB, did not appreciate any wheezes, or rhonchi Abdomen: + BS; soft, ND, NT Vascular: 2+ pedal pulses b/l; 2+ radial pulses b/l Extremities: 2+ edema in the bilateral lower extremities, no muscle wasting Neuro: strength and sensation intact in upper and lower extremities b/l   Labs on Admission: I have personally reviewed following labs and imaging studies  CBC: Recent Labs  Lab 12/23/20 1648  WBC 6.7  NEUTROABS 5.3  HGB 11.0*  HCT 34.8*  MCV 108.4*  PLT 122*   Basic Metabolic Panel: Recent Labs  Lab 12/23/20 1648  NA 143  K 5.5*  CL 108  CO2 23  GLUCOSE 132*  BUN 34*  CREATININE 1.99*  CALCIUM 8.6*   GFR: CrCl cannot be calculated (Unknown ideal weight.). Liver Function Tests: No results for input(s): AST, ALT, ALKPHOS, BILITOT, PROT, ALBUMIN in the last 168 hours. No results for input(s): LIPASE, AMYLASE in the last 168 hours. No results for input(s): AMMONIA in the last 168 hours. Coagulation Profile: No results for input(s): INR, PROTIME in the last 168 hours. Cardiac Enzymes: No results for input(s): CKTOTAL, CKMB,  CKMBINDEX, TROPONINI in the last 168 hours. BNP (last 3 results) No results for input(s): PROBNP in the last 8760 hours. HbA1C: No results for input(s): HGBA1C in the last 72 hours. CBG: No results for input(s): GLUCAP in the last 168 hours. Lipid Profile: No results for input(s): CHOL, HDL, LDLCALC, TRIG, CHOLHDL, LDLDIRECT in the last 72 hours. Thyroid Function Tests: No results for input(s): TSH, T4TOTAL, FREET4, T3FREE, THYROIDAB in the last 72 hours. Anemia Panel: No results for input(s): VITAMINB12, FOLATE, FERRITIN, TIBC, IRON, RETICCTPCT in the last 72 hours. Urine analysis:    Component Value Date/Time   COLORURINE YELLOW 09/25/2019 2039   APPEARANCEUR CLEAR 09/25/2019 2039   LABSPEC 1.015 09/25/2019  2039   PHURINE 5.0 09/25/2019 2039   GLUCOSEU NEGATIVE 09/25/2019 2039   HGBUR NEGATIVE 09/25/2019 2039   BILIRUBINUR NEGATIVE 09/25/2019 2039   KETONESUR NEGATIVE 09/25/2019 2039   PROTEINUR NEGATIVE 09/25/2019 2039   NITRITE NEGATIVE 09/25/2019 2039   LEUKOCYTESUR MODERATE (A) 09/25/2019 2039    Radiological Exams on Admission: DG Chest Port 1 View  Result Date: 12/23/2020 CLINICAL DATA:  Shortness of breath EXAM: PORTABLE CHEST 1 VIEW COMPARISON:  12/03/2020 FINDINGS: The heart and mediastinal contours are within normal limits. Diffuse bilateral interstitial pulmonary opacity and bilateral pleural effusions, increased compared to prior examination. IMPRESSION: Diffuse bilateral interstitial pulmonary opacity and bilateral pleural effusions, increased compared to prior examination, concerning for pulmonary edema. Electronically Signed   By: Lauralyn Primes M.D.   On: 12/23/2020 17:12     EKG: Independently reviewed, with result as described above.    Assessment/Plan   [name] is a 87 y.o. female with medical history significant for coronary artery disease status post NSTEMI in 2010 status post drug-eluting stent x1 to left circumflex, chronic diastolic heart failure,  type 2 diabetes mellitus, dementia, stage IIIb chronic kidney disease with baseline creatinine 1.5-1.8, essential pretension, hyperlipidemia, chronic iron deficiency anemia with baseline hemoglobin 10-11, recent hospitalization for COVID-19 infection who is admitted to [facility] on 12/23/2020 with acute on chronic diastolic heart failure after presenting from home to [facility] ED complaining of shortness of breath.    Principal Problem:   Acute on chronic diastolic (congestive) heart failure (HCC) Active Problems:   DM2 (diabetes mellitus, type 2) (HCC)   Hyperlipidemia   HYPERTENSION, BENIGN   SOB (shortness of breath)   Elevated troponin   Acute kidney injury superimposed on CKD (HCC)   Hyperkalemia      #) Acute on chronic diastolic heart failure: dx of acute decompensation on the basis of presenting 3 days of progressive shortness of breath associated with orthopnea, worsening nonproductive cough, and new development of edema in the bilateral lower extremities, with evidence of increased work of breathing, tachycardia, and presenting chest x-ray showing interval development of diffuse bilateral airspace opacities as well as bilateral pleural effusions consistent with pulmonary edema. This is in the context of a known history of chronic diastolic heart failure, with most recent echocardiogram performed in 2011, with results as further detailed above. Etiology leading to presenting acutely decompensated heart failure is not entirely clear at this time, although physiologic stress from her recent COVID-19 infection is a potential contributing factor. I suspect that mildly elevated initial troponin is a consequence of underlying acutely decompensated heart failure, as well as associated diminished diastolic filling time corresponding presenting tachycardia, as opposed to representing ACS causing presenting acute heart failure exacerbation, particularly in the absence of any recent CP, and with  presenting EKG showing no evidence of acute ischemic changes.  Additionally, there is likely some contribution from elevated troponin from interval decline in renal clearance thereafter the setting of acute kidney injury superimposed on stage IIIb chronic kidney disease, as further detailed below. will continue to evaluate with further trending of troponin and close monitoring on tele, with echocardiogram ordered for the morning. Patient conveys good compliance with home diuretic therapy, which consists of Lasix 40 mg po daily, as well as good compliance with their additional home cardiac medications, which include losartan.  Of note, she is not on a beta-blocker at home, and therefore will refrain from initiation of a beta-blocker at this time given presence of acutely decompensated heart  failure.  In the setting of presenting acute kidney injury as well as hyperkalemia, will hold home losartan for now, with close monitoring of ensuing renal function/potassium level to assist with guiding timing of resumption of home losartan for afterload reduction purposes.  Of note, patient received Lasix 40 mg IV x1 at 1800 while in the ED today. Presentation warrants additional IV diuresis, as further detailed below, with close monitoring of ensuing renal function, electrolytes, and volume status. Will also closely monitor ensuing HR as an additional means to evaluate volume status and help guide subsequent diuresis decision-making.     Plan: monitor strict I's & O's and daily weights. Monitor on telemetry, including trend in HR in response to diuresis, as above. Monitor continuous pulse oximetry.  Lasix 20 mg IV x1 at 0100 on 12/24/20 followed by Lasix 20 mg IV twice daily, which will represent, collectively, greater than twice the effective dose of diuretic that she was receiving as an outpatient, while, at least initially, refraining from more aggressive IV diuresis and renal function in response to these initial IV  diuresis efforts.  Repeat BMP at 2200 this evening in the setting of mild presenting hyperkalemia.  Liver enzymes for calculation of current serum calcium level .  CMP in the morning, including for monitoring trend of potassium, bicarbonate, and renal function in response to interval diuresis efforts. Add-on serum magnesium level, and repeat this level in the AM. Close monitoring of ensuing blood pressure response to diuresis efforts, including to help guide need for improvement in afterload reduction in order to optimize cardiac output.  For now, hold home losartan, as above.  Trend troponin.  Echocardiogram in the morning.      #) Elevated troponin: mildly elevated initial troponin of 113, with no prior data point available in EMR for point of comparison. Suspect that this mildly elevated troponin is on the basis of supply demand mismatch in the setting of acute on chronic diastolic heart failure as well as diminished coronary artery filling time associated with relative decline in diastolic filling time as a consequence of presenting sinus tachycardia as opposed to representing a type I process due to acute plaque rupture. Additionally, relative decline in renal clearance of troponin in the setting of AKI is likely also contributing to presenting troponin elevation. EKG shows no evidence of acute ischemic changes, including no evidence of STEMI, while CXR shows evidence of acute on chronic heart failure, without evidence of pneumothorax.  Additionally, presentation is not associate with any chest pain. Overall, ACS is felt to be less likely relative to type 2 supply demand mismatch, as above, but will closely monitor on telemetry overnight while treating suspected underlying acute on chronic diastolic heart failure, as further described above.  While the patient is certainly at increased risk for development of acute pulmonary embolism in the setting of recent COVID-19 infection requiring hospitalization,  presentation, clinically, appears less suggestive of this possibility at the present time.  Full dose aspirin administered in the ED this evening.  Refrain from beta-blocker for now in the setting of acutely decompensated heart failure and no prior use of beta-blocker at home.  Temporarily holding losartan, as above, in the setting of hyperkalemia.  There does not appear to be an indication for initiation of heparin drip at this time, although we will continue to close monitor ensuing troponin as well as clinical trend in order to assess for development of interval indication for initiation of such.  Echo in the morning.  Plan: Continue to trend serial troponin.  Monitor on telemetry. PRN EKG for development of chest pain. PRN sublingual nitroglycerin for CP. Check serum Mg level and repeat BMP in the morning, with prn supplementation to maintain Mg and potassium levels greater than or equal to 2.0 and 4.0, respectively, to further reduce risk of ventricular arrhythmia. Repeat CBC in the AM. Additional evaluation and management of presenting acute on chronic diastolic heart failure, as suspected driving force behind mildly elevated troponin, as above.  Echocardiogram has been ordered for the morning.Resume home high intensity atorvastatin, with next dose to occur now.        #) Recent COVID-19 infection: Positive COVID-19 test performed on 12/03/2020, resulting in hospitalization at [facility] from 12/03/2020 to 12/05/2020 following which the patient was discharged to home on room air, with improving respiratory symptoms.  Given that her respiratory symptoms were improving leading up to the last 3 days, when she developed shortness of breath and associated symptoms consistent with acute decompensated heart failure, and given that her COVID-19 test 3 weeks ago with interval improvement in respiratory symptoms, there does not appear to be an indication to repeat COVID-19 testing at this time nor is there  appear to be an indication for airborne precautions at this time.  Presenting chest x-ray consistent with acute heart failure.  Not septic.     Plan: Monitor on symmetry.  Monitor continuous pulse oximetry.  Work-up and management of presenting acute on chronic diastolic heart failure, as above.  Repeat CBC in the morning.       #) Hyperkalemia: Presenting serum potassium level found to be mildly elevated at 5.5 relative to most recent prior value of 4.6 on 12/05/2020.  Appears to be on the basis of interval worsening of renal function, with presenting AKI on CKD 3B, as further detailed below.  Anticipate some improvement of current renal function with interval IV diuresis, close monitoring for corresponding improvement in serum potassium level via timed BMP for 2200 this evening.  Add on liver enzymes for purpose of calculating current serum calcium level in order to have this data point available to assist with consideration for calcium gluconate administration.  Should repeat BMP at 2200 this evening to reflect improving serum potassium level, will consider administration of Kayexalate at that time.  At this time, no indication for urgent overnight hemodialysis, not appear uremic have an anion gap metabolic acidosis.  Closely monitor ensuing volume status, particularly in the context of acute on chronic diastolic heart failure.  Potential additional pharmacologic contribution from home losartan, which will be held for now.  EKG shows sinus tachycardia without evidence of hyperkalemic associated changes.    Plan: Further evaluation management of acute on chronic diastolic heart failure, including IV diuresis efforts, as noted above.  Repeat BMP at 2200 this evening, with consideration for Kayexalate at that time.  Add on liver enzymes for calculation of serum calcium level, as above.  Monitor on telemetry.  Monitor continuous pulse oximetry.  Hold home losartan.  CMP in the morning.  Monitor strict  I's and O's Daily weights.  Further evaluation management of presenting acute on chronic kidney disease, as detailed below.      #) Acute kidney injury on stage IIIb CKD: Relative to baseline creatinine of 1.5-1.8, with most recent prior creatinine data point of 1.83 on 12/05/2020, presenting creatinine found to be elevated at 1.99.  Suspect contribution from diminished renal perfusion gradient as a consequence of presenting acute on chronic diastolic  heart failure, as further detailed above.  Appears to be associated with mild hyperkalemia, as above.  On losartan as an outpatient, as further noted above.  Given suspected inciting underlying pathology, will proceed with plan for IV diuresis, with close monitoring of ensuing renal function as well as associated electrolytes, as further detailed below.  Plan: IV diuresis efforts, as above.  Repeat BMP at 2200 this evening, particularly to closely monitor ensuing serum potassium level, as above.  Check urinalysis with microscopy.  Add on random urine sodium as well as random urine creatinine.  Monitor strict I's and O's Daily weights.  Tempt avoid nephrotoxic agents.  Repeat CMP in the morning.  Hold home losartan.       #) Type 2 diabetes mellitus: Appears to be managed via lifestyle modifications, as the patient is not currently on any oral hypoglycemic medications or insulin as an outpatient.  Presenting blood sugar noted to be 132.  Plan: Accu-Cheks before every meal and at bedtime with very low-dose sliding scale insulin.      #) Essential Hypertension: Outpatient hypertensive regimen includes losartan as well as Lasix.  Presenting blood pressures been in the 130s 140s mmHg. in the setting of acute on chronic diastolic heart failure, will hold home Lasix while pursuing IV diuresis efforts, as above.  Additionally, in the setting of AKI/hyperkalemia, will hold home losartan for now.  Plan: Hold home Lasix while administering IV diuresis.   Hold home losartan.  Close monitoring of ensuing blood pressure via routine vital signs.       #) Hyperlipidemia: On high intensity atorvastatin as an outpatient.  Plan: Continue home atorvastatin 80 mg p.o. nightly, with next dose to occur now.      DVT prophylaxis: SCDs Code Status: Per my discussions this evening with the patient as well as her husband, the patient/her husband convey the patient's wishes for DNR/DNI status. Family Communication: Case was discussed with the patient's husband, who was present at bedside Disposition Plan: Per Rounding Team Consults called: none  Admission status: Inpatient; PCU     Of note, this patient was added by me to the following Admit List/Treatment Team: [email or web address].      PLEASE NOTE THAT DRAGON DICTATION SOFTWARE WAS USED IN THE CONSTRUCTION OF THIS NOTE.   Angie Fava DO [facility] Pager [phone] From 6PM - 2AM  Otherwise, please contact night-coverage  [email or web address] Password [facility]   12/23/2020, 6:58 PM

## 2020-12-23 NOTE — ED Triage Notes (Signed)
Pt BIB EMS from home c/o intermittent shob x 3 days. BLLE edema. Possible hx of CHF. Pt on lasix. Hx of pneumonia x 3 weeks ago. EKG sinus tach w/ PVC's. O2 92% on RA, 99% on 2L [place]. Not on o2 at home.

## 2020-12-24 ENCOUNTER — Encounter (HOSPITAL_COMMUNITY): Payer: Self-pay | Admitting: Internal Medicine

## 2020-12-24 ENCOUNTER — Other Ambulatory Visit: Payer: Self-pay

## 2020-12-24 ENCOUNTER — Telehealth: Payer: Self-pay | Admitting: Student in an Organized Health Care Education/Training Program

## 2020-12-24 ENCOUNTER — Inpatient Hospital Stay (HOSPITAL_COMMUNITY): Payer: Medicare Other

## 2020-12-24 ENCOUNTER — Encounter (HOSPITAL_COMMUNITY): Payer: Medicare Other

## 2020-12-24 ENCOUNTER — Other Ambulatory Visit (HOSPITAL_COMMUNITY): Payer: Medicare Other

## 2020-12-24 DIAGNOSIS — R778 Other specified abnormalities of plasma proteins: Secondary | ICD-10-CM | POA: Diagnosis not present

## 2020-12-24 DIAGNOSIS — I5033 Acute on chronic diastolic (congestive) heart failure: Secondary | ICD-10-CM

## 2020-12-24 DIAGNOSIS — I2510 Atherosclerotic heart disease of native coronary artery without angina pectoris: Secondary | ICD-10-CM

## 2020-12-24 LAB — LIPID PANEL
Cholesterol: 184 mg/dL (ref 0–200)
HDL: 60 mg/dL (ref >40)
LDL Cholesterol: 105 mg/dL — ABNORMAL HIGH (ref 0–99)
Total CHOL/HDL Ratio: 3.1 RATIO
Triglycerides: 96 mg/dL (ref <150)
VLDL: 19 mg/dL (ref 0–40)

## 2020-12-24 LAB — ECHOCARDIOGRAM COMPLETE
AR max vel: 2 cm2
AV Area VTI: 1.79 cm2
AV Area mean vel: 1.83 cm2
AV Mean grad: 2 mmHg
AV Peak grad: 4.3 mmHg
Ao pk vel: 1.04 m/s
Area-P 1/2: 5.23 cm2
Calc EF: 16.3 %
Height: 64 in
MV M vel: 4.46 m/s
MV Peak grad: 79.6 mmHg
Radius: 0.5 cm
S' Lateral: 5.8 cm
Single Plane A2C EF: 23.8 %
Single Plane A4C EF: 9.2 %
Weight: 2481.6 oz

## 2020-12-24 LAB — COMPREHENSIVE METABOLIC PANEL
ALT: 13 U/L (ref 0–44)
AST: 18 U/L (ref 15–41)
Albumin: 3.8 g/dL (ref 3.5–5.0)
Alkaline Phosphatase: 83 U/L (ref 38–126)
Anion gap: 11 (ref 5–15)
BUN: 33 mg/dL — ABNORMAL HIGH (ref 8–23)
CO2: 27 mmol/L (ref 22–32)
Calcium: 9 mg/dL (ref 8.9–10.3)
Chloride: 107 mmol/L (ref 98–111)
Creatinine, Ser: 1.97 mg/dL — ABNORMAL HIGH (ref 0.44–1.00)
GFR, Estimated: 24 mL/min — ABNORMAL LOW (ref >60)
Glucose, Bld: 116 mg/dL — ABNORMAL HIGH (ref 70–99)
Potassium: 4.8 mmol/L (ref 3.5–5.1)
Sodium: 145 mmol/L (ref 135–145)
Total Bilirubin: 0.9 mg/dL (ref 0.3–1.2)
Total Protein: 6.4 g/dL — ABNORMAL LOW (ref 6.5–8.1)

## 2020-12-24 LAB — TROPONIN I (HIGH SENSITIVITY)
Troponin I (High Sensitivity): 451 ng/L (ref <18)
Troponin I (High Sensitivity): 546 ng/L (ref <18)
Troponin I (High Sensitivity): 557 ng/L (ref <18)

## 2020-12-24 LAB — PHOSPHORUS: Phosphorus: 4.4 mg/dL (ref 2.5–4.6)

## 2020-12-24 LAB — CBC
HCT: 33.6 % — ABNORMAL LOW (ref 36.0–46.0)
Hemoglobin: 10.4 g/dL — ABNORMAL LOW (ref 12.0–15.0)
MCH: 33.9 pg (ref 26.0–34.0)
MCHC: 31 g/dL (ref 30.0–36.0)
MCV: 109.4 fL — ABNORMAL HIGH (ref 80.0–100.0)
Platelets: 116 10*3/uL — ABNORMAL LOW (ref 150–400)
RBC: 3.07 MIL/uL — ABNORMAL LOW (ref 3.87–5.11)
RDW: 14.6 % (ref 11.5–15.5)
WBC: 7.4 10*3/uL (ref 4.0–10.5)
nRBC: 0 % (ref 0.0–0.2)

## 2020-12-24 LAB — GLUCOSE, CAPILLARY
Glucose-Capillary: 170 mg/dL — ABNORMAL HIGH (ref 70–99)
Glucose-Capillary: 118 mg/dL — ABNORMAL HIGH (ref 70–99)
Glucose-Capillary: 114 mg/dL — ABNORMAL HIGH (ref 70–99)
Glucose-Capillary: 123 mg/dL — ABNORMAL HIGH (ref 70–99)

## 2020-12-24 LAB — MAGNESIUM: Magnesium: 2 mg/dL (ref 1.7–2.4)

## 2020-12-24 LAB — D-DIMER, QUANTITATIVE: D-Dimer, Quant: 1.66 ug/mL-FEU — ABNORMAL HIGH (ref 0.00–0.50)

## 2020-12-24 LAB — HEPARIN LEVEL (UNFRACTIONATED)
Heparin Unfractionated: 0.55 IU/mL (ref 0.30–0.70)
Heparin Unfractionated: 0.66 IU/mL (ref 0.30–0.70)

## 2020-12-24 MED ORDER — HEPARIN (PORCINE) 25000 UT/250ML-% IV SOLN
850.0000 [IU]/h | INTRAVENOUS | Status: DC
  Administered 2020-12-24: 850 [IU]/h via INTRAVENOUS
  Filled 2020-12-24 (×2): qty 250

## 2020-12-24 MED ORDER — ORAL CARE MOUTH RINSE
15.0000 mL | Freq: Two times a day (BID) | OROMUCOSAL | Status: DC
  Administered 2020-12-24 – 2020-12-31 (×14): 15 mL via OROMUCOSAL

## 2020-12-24 MED ORDER — METOPROLOL TARTRATE 25 MG PO TABS
12.5000 mg | ORAL_TABLET | Freq: Two times a day (BID) | ORAL | Status: DC
  Administered 2020-12-24 – 2020-12-25 (×3): 12.5 mg via ORAL
  Filled 2020-12-24 (×4): qty 1

## 2020-12-24 MED ORDER — FUROSEMIDE 10 MG/ML IJ SOLN
40.0000 mg | Freq: Every day | INTRAMUSCULAR | Status: DC
  Administered 2020-12-25 – 2020-12-26 (×2): 40 mg via INTRAVENOUS
  Filled 2020-12-24 (×2): qty 4

## 2020-12-24 MED ORDER — HEPARIN (PORCINE) 25000 UT/250ML-% IV SOLN
800.0000 [IU]/h | INTRAVENOUS | Status: AC
  Administered 2020-12-24: 800 [IU]/h via INTRAVENOUS
  Filled 2020-12-24 (×2): qty 250

## 2020-12-24 MED ORDER — HEPARIN BOLUS VIA INFUSION
3000.0000 [IU] | Freq: Once | INTRAVENOUS | Status: AC
  Administered 2020-12-24: 3000 [IU] via INTRAVENOUS
  Filled 2020-12-24: qty 3000

## 2020-12-24 NOTE — Progress Notes (Addendum)
PROGRESS NOTE    [name]  OMV:[id number] DOB: 1931/04/21 DOA: 12/23/2020 PCP: Rodrigo Ran, MD  Outpatient Specialists: cardiology    Brief Narrative:   Hx cad with nstemi in 2010 and stent placed, also t2dm, dementia, ckd3, htn, admitted for covid pneumonia last month, discharged w/o o2. Here presenting with several days worsening shortness. Here sob on arrival, no hypoxia. Troponin elevated, cardiology consulted. Admitted for chf exacerbation.   Assessment & Plan:   Principal Problem:   Acute on chronic diastolic (congestive) heart failure (HCC) Active Problems:   DM2 (diabetes mellitus, type 2) (HCC)   Hyperlipidemia   HYPERTENSION, BENIGN   SOB (shortness of breath)   Elevated troponin   Acute kidney injury superimposed on CKD (HCC)   Hyperkalemia  # Shortness of breath # NSTEMI # CHF exacerbation # Hx CAD Here w/ several days sob in the setting of recent covid infection. Trop 100s>500s. No ischemic ekg changes. Dimer is elevated. Cxr w/ b/l interstitial opacities concerning for pulm edema with b/l effusions. Bnp mildly elevated. Received asa 324 - cardiology consulted, recs pending - continue heparin - TTE ordered - cont lasix, think is close to euvolemia so likely can transition to oral home dose tomorrow - strict I/os - cont metop, asa, statin - consider thoracentesis if fails to improve - on [place] o2 but no hypoxia documented, will communicate w/ nursing to see about weaning  # T2DM - cont SSI  # CKD3 Baseline cr 1.5-2, here 1.97 - diuresis as above, monitor kidney function closely  # Hyperkalemia Resolved w/ diuresis - monitor   DVT prophylaxis: heparin Code Status: DNR Family Communication: husband updated @ bedside  Level of care: Progressive Status is: Inpatient  Remains inpatient appropriate because:Inpatient level of care appropriate due to severity of illness  Dispo: The patient is from: Home              Anticipated d/c is to:  tbd               Patient currently is not medically stable to d/c.   Difficult to place patient No        Consultants:  cardiology  Procedures: none  Antimicrobials:  none    Subjective: This morning feels a bit sob. No chest pain or arm pain. No cough. No abd pain. Has appetite  Objective: Vitals:   12/24/20 0035 12/24/20 0233 12/24/20 0600 12/24/20 0657  BP: 129/88 (!) 140/94  (!) 144/95  Pulse: (!) 104 (!) 117  (!) 104  Resp: 20 19  (!) 21  Temp: 98.1 F (36.7 C) 97.9 F (36.6 C)  (!) 97.5 F (36.4 C)  TempSrc: Oral Oral  Oral  SpO2: 97% 97%  97%  Weight:   70.4 kg   Height:        Intake/Output Summary (Last 24 hours) at 12/24/2020 0902 Last data filed at 12/24/2020 0836 Gross per 24 hour  Intake 128.63 ml  Output 1701 ml  Net -1572.37 ml   Filed Weights   12/23/20 2243 12/24/20 0600  Weight: 70.9 kg 70.4 kg    Examination:  General exam: Appears calm and comfortable  Respiratory system: rales @ bases Cardiovascular system: S1 & S2 heard, RRR. No JVD, murmurs, rubs, gallops or clicks. No pedal edema. Gastrointestinal system: Abdomen is nondistended, soft and nontender. No organomegaly or masses felt. Normal bowel sounds heard. Central nervous system: Alert and oriented. No focal neurological deficits. Extremities: Symmetric 5 x 5 power. Trace LE edema Skin:  No rashes, lesions or ulcers Psychiatry: Judgement and insight appear normal. Mood & affect appropriate.     Data Reviewed: I have personally reviewed following labs and imaging studies  CBC: Recent Labs  Lab 12/23/20 1648 12/24/20 0044  WBC 6.7 7.4  NEUTROABS 5.3  --   HGB 11.0* 10.4*  HCT 34.8* 33.6*  MCV 108.4* 109.4*  PLT 122* 116*   Basic Metabolic Panel: Recent Labs  Lab 12/23/20 1648 12/23/20 1848 12/23/20 2200 12/24/20 0044  NA 143  --  144 145  K 5.5*  --  4.8 4.8  CL 108  --  109 107  CO2 23  --  25 27  GLUCOSE 132*  --  110* 116*  BUN 34*  --  33* 33*  CREATININE 1.99*   --  1.91* 1.97*  CALCIUM 8.6*  --  8.5* 9.0  MG  --  1.5*  --  2.0  PHOS  --   --   --  4.4   GFR: Estimated Creatinine Clearance: 19 mL/min (A) (by C-G formula based on SCr of 1.97 mg/dL (H)). Liver Function Tests: Recent Labs  Lab 12/23/20 2200 12/24/20 0044  AST 17 18  ALT 12 13  ALKPHOS 83 83  BILITOT 0.5 0.9  PROT 6.4* 6.4*  ALBUMIN 3.7 3.8   No results for input(s): LIPASE, AMYLASE in the last 168 hours. No results for input(s): AMMONIA in the last 168 hours. Coagulation Profile: No results for input(s): INR, PROTIME in the last 168 hours. Cardiac Enzymes: No results for input(s): CKTOTAL, CKMB, CKMBINDEX, TROPONINI in the last 168 hours. BNP (last 3 results) No results for input(s): PROBNP in the last 8760 hours. HbA1C: No results for input(s): HGBA1C in the last 72 hours. CBG: Recent Labs  Lab 12/23/20 2253 12/24/20 0756  GLUCAP 118* 170*   Lipid Profile: Recent Labs    12/24/20 0235  CHOL 184  HDL 60  LDLCALC 105*  TRIG 96  CHOLHDL 3.1   Thyroid Function Tests: No results for input(s): TSH, T4TOTAL, FREET4, T3FREE, THYROIDAB in the last 72 hours. Anemia Panel: No results for input(s): VITAMINB12, FOLATE, FERRITIN, TIBC, IRON, RETICCTPCT in the last 72 hours. Urine analysis:    Component Value Date/Time   COLORURINE STRAW (A) 12/23/2020 1938   APPEARANCEUR CLEAR 12/23/2020 1938   LABSPEC 1.009 12/23/2020 1938   PHURINE 6.0 12/23/2020 1938   GLUCOSEU NEGATIVE 12/23/2020 1938   HGBUR NEGATIVE 12/23/2020 1938   BILIRUBINUR NEGATIVE 12/23/2020 1938   KETONESUR NEGATIVE 12/23/2020 1938   PROTEINUR NEGATIVE 12/23/2020 1938   NITRITE NEGATIVE 12/23/2020 1938   LEUKOCYTESUR NEGATIVE 12/23/2020 1938   Sepsis Labs: @LABRCNTIP (procalcitonin:4,lacticidven:4)  )No results found for this or any previous visit (from the past 240 hour(s)).       Radiology Studies: DG Chest Port 1 View  Result Date: 12/23/2020 CLINICAL DATA:  Shortness of breath  EXAM: PORTABLE CHEST 1 VIEW COMPARISON:  12/03/2020 FINDINGS: The heart and mediastinal contours are within normal limits. Diffuse bilateral interstitial pulmonary opacity and bilateral pleural effusions, increased compared to prior examination. IMPRESSION: Diffuse bilateral interstitial pulmonary opacity and bilateral pleural effusions, increased compared to prior examination, concerning for pulmonary edema. Electronically Signed   By: 12/05/2020 M.D.   On: 12/23/2020 17:12        Scheduled Meds:  aspirin EC  81 mg Oral Daily   atorvastatin  80 mg Oral QHS   furosemide  20 mg Intravenous BID   insulin aspart  0-6 Units Subcutaneous  TID WC   mouth rinse  15 mL Mouth Rinse BID   metoprolol tartrate  12.5 mg Oral BID   Continuous Infusions:  heparin 850 Units/hr (12/24/20 0330)     LOS: 1 day    Time spent: 30 min    Silvano Bilis, MD [facility]   If 7PM-7AM, please contact night-coverage [email or web address] Password [email or web address] 12/24/2020, 9:02 AM

## 2020-12-24 NOTE — Progress Notes (Addendum)
Cardiology Consultation:   Patient ID: [name] MRN: [id number]; DOB: [birth year]  Admit date: 12/23/2020 Date of Consult: 12/24/2020  PCP:  Rodrigo Ran, MD   [facility] Providers Cardiologist:  None       Last seen by Dr. Excell Seltzer 06/2016   Patient Profile:   [name] is a 87 y.o. female with a hx of CAD, hx NSTEMI 2010, with  DES to LCX, SM-2, HTN, Gout, dementia and had COVID 19 PNA 12/03/20 who is being seen 12/24/2020 for the evaluation of CHF/elevated troponin at the request of Dr. Ashok Pall. Marland Kitchen  History of Present Illness:   Ms. Fromme with above hx and last seen by cardiology in 2017 with hx NSTEMI 2010 and DES to LCX. She had non obst. LM, LAD and RCA disease.  Echo 2011 with EF 50-55% .  In May 2022 she had COVID 19 PNA and treated with high dose steroids, remdesivir and ABX.    This admit she presented after 3 days of SOB, bilateral lower ext edema and ST.  +hypoxia on RA.  She is noted to have elevated troponin and cardiology consulted overnight.  She was started on IV heparin.  She was load with ASA.  BB added.  Per note her demential is fairly advanced.  Echo has been ordered.      EKG:  The EKG was personally reviewed and demonstrates:  similar to prior EKGs with ant. And inf. Q waves and ST without other signs of ischemia.  EKG today with ST at 112 with PACs and LAFB, otherwise similar to yesterday EKG Telemetry:  Telemetry was personally reviewed and demonstrates:  ST  BNP on admit 106.8 Hs Troponin 113, 151, 451, 546, 557  Tchol 184, HDL 60, LDL 105 TG 96 WBC 7.4, Hgb 10.4, plts 116 down from 142 on admit Na 145, K+ 4.8 glucose 1176, Cr 1.97 Mg+ 2.0  Ddimer 1.66 U/A neg PCXR Diffuse bilateral interstitial pulmonary opacity and bilateral pleural effusions, increased compared to prior examination, concerning for pulmonary edema.  She rec'd lasix 40 last evening and now on 20 mg IV BID  BB added at 12.5 BID   BP 144/95 P 83-106 afebrile  Has not had any chest pain  only the SOB that would come and go.  No pain now.    Past Medical History:  Diagnosis Date   Chest pain    Coronary atherosclerosis of native coronary artery    NSTEMI 11/10, LCx stent   Diabetes mellitus    type II   Gout    Hypertension    Low back pain    chronic   Obesity     Past Surgical History:  Procedure Laterality Date   APPENDECTOMY     CORONARY STENT PLACEMENT       Home Medications:  Prior to Admission medications   Medication Sig Start Date End Date Taking? Authorizing Provider  ACCU-CHEK AVIVA PLUS test strip  01/11/15   [provider]  aspirin 81 MG tablet Take 81 mg by mouth daily.    [provider]  atorvastatin (LIPITOR) 80 MG tablet Take 80 mg by mouth at bedtime.    [provider]  febuxostat (ULORIC) 40 MG tablet Take 40 mg by mouth daily.    [provider]  ferrous sulfate 325 (65 FE) MG tablet Take 325 mg by mouth at bedtime.    [provider]  furosemide (LASIX) 40 MG tablet Take 40 mg by mouth daily. 07/30/19  [provider]  Lancets (ACCU-CHEK MULTICLIX) lancets Use as directed to check your blood sugar 01/11/15   [provider]  losartan (COZAAR) 100 MG tablet Take 100 mg by mouth daily.  02/26/15   [provider]  pantoprazole (PROTONIX) 40 MG tablet Take 40 mg by mouth daily. 07/08/19   [provider]  rivastigmine (EXELON) 4.6 mg/24hr Place 4.6 mg onto the skin daily.    [provider]  Vitamin D, Ergocalciferol, (DRISDOL) 1.25 MG (50000 UNIT) CAPS capsule Take 50,000 Units by mouth once a week. 07/30/19   [provider]    Inpatient Medications: Scheduled Meds:  aspirin EC  81 mg Oral Daily   atorvastatin  80 mg Oral QHS   [START ON 12/25/2020] furosemide  40 mg Intravenous Daily   insulin aspart  0-6 Units Subcutaneous TID WC   mouth rinse  15 mL Mouth Rinse BID   metoprolol tartrate  12.5 mg Oral BID   Continuous Infusions:  heparin  850 Units/hr (12/24/20 0330)   PRN Meds: acetaminophen **OR** acetaminophen, ipratropium-albuterol  Allergies:    Allergies  Allergen Reactions   Allopurinol Hives   Sulfonamide Derivatives Hives    Social History:   Social History   Socioeconomic History   Marital status: Married    Spouse name: Not on file   Number of children: Not on file   Years of education: Not on file   Highest education level: Not on file  Occupational History   Occupation: retired    Comment: quality control lab with [employer]  Tobacco Use   Smoking status: Never   Smokeless tobacco: Never  Substance and Sexual Activity   Alcohol use: No   Drug use: No   Sexual activity: Not on file  Other Topics Concern   Not on file  Social History Narrative   Not on file   Social Determinants of Health   Financial Resource Strain: Not on file  Food Insecurity: Not on file  Transportation Needs: Not on file  Physical Activity: Not on file  Stress: Not on file  Social Connections: Not on file  Intimate Partner Violence: Not on file    Family History:    Family History  Problem Relation Age of Onset   Stroke Mother 54       deceased   Heart attack Father 63       deceased   Other Other        no CAD in siblings (3 brothers and 1 sister)     ROS:  Please see the history of present illness.  General:no colds or fevers, no weight changes Skin:no rashes or ulcers HEENT:no blurred vision, no congestion CV:see HPI PUL:see HPI GI:no diarrhea constipation or melena, no indigestion GU:no hematuria, no dysuria MS:no joint pain, no claudication Neuro:no syncope, no lightheadedness Endo:+ diabetes, no thyroid disease  All other ROS reviewed and negative.     Physical Exam/Data:   Vitals:   12/24/20 0233 12/24/20 0600 12/24/20 0657 12/24/20 1117  BP: (!) 140/94  (!) 144/95 124/78  Pulse: (!) 117  (!) 104 85  Resp: 19  (!) 21 (!) 24  Temp: 97.9 F (36.6 C)  (!) 97.5 F (36.4 C) 98.1 F (36.7  C)  TempSrc: Oral  Oral   SpO2: 97%  97% 96%  Weight:  70.4 kg    Height:        Intake/Output Summary (Last 24 hours) at 12/24/2020 1259 Last data filed at 12/24/2020 [phone]  Gross per 24 hour  Intake 128.63 ml  Output 1701 ml  Net -1572.37 ml   Last 3 Weights 12/24/2020 12/23/2020 12/04/2020  Weight (lbs) 155 lb 1.6 oz 156 lb 4.8 oz 180 lb 12.4 oz  Weight (kg) 70.353 kg 70.897 kg 82 kg     Body mass index is 26.62 kg/m.  General:  Well nourished, well developed, in no acute distress HEENT: normal Lymph: no adenopathy Neck: no JVD sitting up in bed Endocrine:  No thryomegaly Vascular: No carotid bruits; pedal pulses 2+ bilaterally   Cardiac:  normal S1, S2; RRR; no murmur gaLLUP rub or click Lungs:  diminished Lt base- few rales to auscultation bilaterally, no wheezing, rhonchi  Abd: soft, nontender, no hepatomegaly  Ext: no edema Musculoskeletal:  No deformities, BUE and BLE strength normal and equal Skin: warm and dry  Neuro:  alert and answers questions approp, memory is poor no focal abnormalities noted Psych:  Normal affect    Relevant CV Studies: Cardiac cath 2010 with tight stenosis LCX with placement of DES - nonobstructive disease in LM, mild LAD and RCA disease.    Echo 10/2009 with EF 50-55% G1DD, mild MR mildly dilated RV and mild systolic dysfunction of RV.     Laboratory Data:  High Sensitivity Troponin:   Recent Labs  Lab 12/23/20 1648 12/23/20 1848 12/24/20 0044 12/24/20 0235 12/24/20 0427  TROPONINIHS 113* 151* 451* 546* 557*     Chemistry Recent Labs  Lab 12/23/20 1648 12/23/20 2200 12/24/20 0044  NA 143 144 145  K 5.5* 4.8 4.8  CL 108 109 107  CO2 23 25 27   GLUCOSE 132* 110* 116*  BUN 34* 33* 33*  CREATININE 1.99* 1.91* 1.97*  CALCIUM 8.6* 8.5* 9.0  GFRNONAA 24* 25* 24*  ANIONGAP 12 10 11     Recent Labs  Lab 12/23/20 2200 12/24/20 0044  PROT 6.4* 6.4*  ALBUMIN 3.7 3.8  AST 17 18  ALT 12 13  ALKPHOS 83 83  BILITOT 0.5 0.9    Hematology Recent Labs  Lab 12/23/20 1648 12/24/20 0044  WBC 6.7 7.4  RBC 3.21* 3.07*  HGB 11.0* 10.4*  HCT 34.8* 33.6*  MCV 108.4* 109.4*  MCH 34.3* 33.9  MCHC 31.6 31.0  RDW 14.6 14.6  PLT 122* 116*   BNP Recent Labs  Lab 12/23/20 1648  BNP 106.8*    DDimer  Recent Labs  Lab 12/24/20 0427  DDIMER 1.66*     Radiology/Studies:  DG Chest Port 1 View  Result Date: 12/23/2020 CLINICAL DATA:  Shortness of breath EXAM: PORTABLE CHEST 1 VIEW COMPARISON:  12/03/2020 FINDINGS: The heart and mediastinal contours are within normal limits. Diffuse bilateral interstitial pulmonary opacity and bilateral pleural effusions, increased compared to prior examination. IMPRESSION: Diffuse bilateral interstitial pulmonary opacity and bilateral pleural effusions, increased compared to prior examination, concerning for pulmonary edema. Electronically Signed   By: Lauralyn PrimesAlex  Bibbey M.D.   On: 12/23/2020 17:12    Assessment and Plan:   Elevated troponin x of CAD and prior DES to LCX in 2010 with MI.  No acute EKG changes.  Will await echo to decide on plan.  May be from CHF post COVID 19 PNA.  Has been loaded with ASA and on IV heparin.  BB added and on lipitor 80  no chest pain  Acute CHF after recent COVID 19 PNA has had lasix 40 and neg 972 cc.  Weight down 0.5 Kg.   though at discharge last admit wt 82 Kg.  Continue lasix 20 BID IV  Hypoxia per IM due to chf Dementia if cardiac cath is needed would need to discuss with family as pt's dementia fairly advanced  pt is DNR- husband stated whatever needed to be done. HLD on lipitor but not sure she was taking.  Elevated ddimer per IM currently on IV heparin AKI on chronic with Cr 1.99 on admit and today 1.97- home cozaar on hold  DM-2 per IM   Risk Assessment/Risk Scores:     TIMI Risk Score for Unstable Angina or Non-ST Elevation MI:   The patient's TIMI risk score is 5, which indicates a 26% risk of all cause mortality, new or recurrent  myocardial infarction or need for urgent revascularization in the next 14 days.  New York Heart Association (NYHA) Functional Class NYHA Class III        For questions or updates, please contact [facility] Please consult [email or web address] for contact info under    Signed, Donato Schultz, MD  12/24/2020 12:59 PM  Personally seen and examined. Agree with above.  87 year old female with known coronary artery disease prior stent to circumflex artery in 2010 with dementia recent COVID-pneumonia in May 2022 here with acute diastolic heart failure and elevated troponin, increasing from 113 up to 557.  Currently she is laying in bed comfortable breathing well with her husband at bedside.  No chest discomfort or dyspnea at this time.  EKG personally reviewed shows sinus tachycardia 112 with PACs and left anterior fascicular block.  GEN: Well nourished, well developed, in no acute distress, elderly HEENT: normal Neck: no JVD, carotid bruits, or masses Cardiac: RRR; no murmurs, rubs, or gallops,no edema  Respiratory:  clear to auscultation bilaterally, normal work of breathing GI: soft, nontender, nondistended, + BS MS: no deformity or atrophy Skin: warm and dry, no rash Neuro:  Alert, Strength and sensation are intact Psych: euthymic mood, full affect  Assessment and plan:  Coronary artery disease with elevated troponin of approximately 500 - Agree with IV heparin, aspirin, beta-blocker and high intensity statin.  She is not having any anginal symptoms currently.  She is comfortable with no shortness of breath.  Agree with IV Lasix 20 mg twice daily.  Tomorrow likely able to convert to p.o. -Tomorrow also should be able to discontinue IV heparin as well -Continue with noninvasive treatment, medical management.  Discussed with family, husband and he agrees.  Donato Schultz, MD

## 2020-12-24 NOTE — Plan of Care (Signed)
  Problem: Coping: Goal: Level of anxiety will decrease Outcome: Progressing   Problem: Elimination: Goal: Will not experience complications related to bowel motility Outcome: Progressing Goal: Will not experience complications related to urinary retention Outcome: Progressing   Problem: Pain Managment: Goal: General experience of comfort will improve Outcome: Progressing   Problem: Safety: Goal: Ability to remain free from injury will improve Outcome: Progressing   Problem: Education: Goal: Ability to verbalize understanding of medication therapies will improve Outcome: Progressing

## 2020-12-24 NOTE — Progress Notes (Signed)
Pt had 20 beat Vtach. Pt resting in bed with eyes closed. No acute distress observed. MD notified. Will continue to monitor for changes.

## 2020-12-24 NOTE — Progress Notes (Addendum)
Received a call from bedside RN regarding up-trending troponin S from 113 to 451.  No chest pain.  Reported some nausea earlier but with no vomiting.  Ordered 12 lead EKG and repeated troponin S.  Patient is on ASA and Lipitor prior to admission for hx of CAD s/p PCI, NSTEMI with Heart score of 6.  Consulted on call cardiology for recommendations.  We will continue to closely monitor and treat as indicated.  UPDATE:  Due to concern for NSTEMI, heparin drip ordered with pharmacy dosing as recommended by cardiology, Dr. Cherly Beach.  Patient has received a full dose of aspirin.  2D echo has been ordered and is pending.  Cardiology will follow in the AM.

## 2020-12-24 NOTE — Progress Notes (Signed)
ANTICOAGULATION CONSULT NOTE - follow up  Pharmacy Consult for heparin Indication: possible NSTEMI  Allergies  Allergen Reactions   Allopurinol Hives   Sulfonamide Derivatives Hives    Patient Measurements: Height: 5\' 4"  (162.6 cm) Weight: 70.4 kg (155 lb 1.6 oz) IBW/kg (Calculated) : 54.7 Heparin Dosing Weight: 70.9 kg  Vital Signs: Temp: 98.1 F (36.7 C) (06/14 1117) BP: 124/78 (06/14 1117) Pulse Rate: 85 (06/14 1117)  Labs: Recent Labs    12/23/20 1648 12/23/20 1848 12/23/20 2200 12/24/20 0044 12/24/20 0235 12/24/20 0427 12/24/20 1208 12/24/20 1947  HGB 11.0*  --   --  10.4*  --   --   --   --   HCT 34.8*  --   --  33.6*  --   --   --   --   PLT 122*  --   --  116*  --   --   --   --   HEPARINUNFRC  --   --   --   --   --   --  0.55 0.66  CREATININE 1.99*  --  1.91* 1.97*  --   --   --   --   TROPONINIHS 113*   < >  --  451* 546* 557*  --   --    < > = values in this interval not displayed.     Estimated Creatinine Clearance: 19 mL/min (A) (by C-G formula based on SCr of 1.97 mg/dL (H)).   Medical History: Past Medical History:  Diagnosis Date   Chest pain    Coronary atherosclerosis of native coronary artery    NSTEMI 11/10, LCx stent   Diabetes mellitus    type II   Gout    Hypertension    Low back pain    chronic   Obesity      Assessment: 87 y.o. female with medical history significant for coronary artery disease status post NSTEMI in 2010 status post drug-eluting stent x1 to left circumflex, chronic diastolic heart failure, type 2 diabetes mellitus, dementia, stage IIIb chronic kidney disease with baseline creatinine 1.5-1.8, essential pretension, hyperlipidemia, chronic iron deficiency anemia with baseline hemoglobin 10-11, recent hospitalization for COVID-19 infection who is admitted to [facility] on 12/23/2020 with acute on chronic diastolic heart failure after presenting from home to [facility] ED complaining of shortness of breath.  Pharmacy consulted to dose heparin drip for concern of NSTEMI.  No prior AC noted  Heparin level at upper end of therapeutic range (0.66) on current IV heparin rate of 850 units/hr CBC ok SCr 1.97 No reported bleeding  Goal of Therapy:  Heparin level 0.3-0.7 units/ml Monitor platelets by anticoagulation protocol: Yes   Plan:  Decrease IV heparin drip slightly to 800 units/hr Recheck heparin level in in AM Daily CBC and HL  [facility], PharmD, BCPS Pharmacy: [phone] 12/24/2020 8:08 PM

## 2020-12-24 NOTE — Progress Notes (Signed)
ANTICOAGULATION CONSULT NOTE - follow up  Pharmacy Consult for heparin Indication: possible NSTEMI  Allergies  Allergen Reactions   Allopurinol Hives   Sulfonamide Derivatives Hives    Patient Measurements: Height: 5\' 4"  (162.6 cm) Weight: 70.4 kg (155 lb 1.6 oz) IBW/kg (Calculated) : 54.7 Heparin Dosing Weight: 70.9 kg  Vital Signs: Temp: 98.1 F (36.7 C) (06/14 1117) Temp Source: Oral (06/14 0657) BP: 124/78 (06/14 1117) Pulse Rate: 85 (06/14 1117)  Labs: Recent Labs    12/23/20 1648 12/23/20 1848 12/23/20 2200 12/24/20 0044 12/24/20 0235 12/24/20 0427 12/24/20 1208  HGB 11.0*  --   --  10.4*  --   --   --   HCT 34.8*  --   --  33.6*  --   --   --   PLT 122*  --   --  116*  --   --   --   HEPARINUNFRC  --   --   --   --   --   --  0.55  CREATININE 1.99*  --  1.91* 1.97*  --   --   --   TROPONINIHS 113*   < >  --  451* 546* 557*  --    < > = values in this interval not displayed.     Estimated Creatinine Clearance: 19 mL/min (A) (by C-G formula based on SCr of 1.97 mg/dL (H)).   Medical History: Past Medical History:  Diagnosis Date   Chest pain    Coronary atherosclerosis of native coronary artery    NSTEMI 11/10, LCx stent   Diabetes mellitus    type II   Gout    Hypertension    Low back pain    chronic   Obesity      Assessment: 87 y.o. female with medical history significant for coronary artery disease status post NSTEMI in 2010 status post drug-eluting stent x1 to left circumflex, chronic diastolic heart failure, type 2 diabetes mellitus, dementia, stage IIIb chronic kidney disease with baseline creatinine 1.5-1.8, essential pretension, hyperlipidemia, chronic iron deficiency anemia with baseline hemoglobin 10-11, recent hospitalization for COVID-19 infection who is admitted to [facility] on 12/23/2020 with acute on chronic diastolic heart failure after presenting from home to [facility] ED complaining of shortness of breath. Pharmacy consulted  to dose heparin drip for concern of NSTEMI.  No prior AC noted  Heparin level therapeutic on current IV heparin rate of 850 units/hr CBC ok SCr 1.97 No reported bleeding  Goal of Therapy:  Heparin level 0.3-0.7 units/ml Monitor platelets by anticoagulation protocol: Yes   Plan:  Continue IV heparin drip at current rate of 850 units/hr Recheck heparin level in another 8 hours to confirm continued goal level at current rate Daily CBC and HL   [facility], PharmD, BCPS Secure Chat if ?s 12/24/2020 12:49 PM

## 2020-12-24 NOTE — Telephone Encounter (Signed)
Messaged by Dr. Margo Aye regarding uptrending troponin.  [name] is an 39F with CAD s/p NSTEMI (2010) s/p DES x1 to Lcx, HFpEF, DM2, dementia, CKD3b, HTN, HLD, Fe def anemia and dementia who was recently admitted (12/03/20) for covid PNA.  During her recent hospitalization she was treated for COVID with high-dose steroids, remdesivir and initially antibiotics.  She had been hypoxic to 90% on room air with occasional drops into the 80s and was tachycardic to the 120s.  She was brought in by EMS on 06/13 for intermittent shortness of breath x3 days, bilateral lower extremity edema, and sinus tach.  She was placed on 2 L nasal cannula with sats at 92% however she is not on oxygen at home.  On presentation she is both tachycardic and tachypneic but did not have any fever or other infectious symptoms.  Her symptoms following recent hospital discharge of cough and SOB improved until 3 days ago when she developed worsening shortness of breath that was much different than what she experienced with her recent COVID infection.  The SOB she currently has is associated with orthopnea and lower extremity edema which were not present at her prior hospitalization.  ECG during this presentation was similar to prior with anterior and inferior Q waves and sinus tachycardia without other signs of new ischemia.  Her initial troponin was elevated at (151) and had a significant delta (->451->546). hsT were not checked at her previous hospitalization.  The hospitalist called to discuss the case and for advice regarding her medications.  I think given the delta troponin is reasonable to heparinize her until we are able to review TTE results today.  She has already been aspirin loaded.  Most probable etiologies for her symptoms and troponin elevation are primary ACS, Takotsubo CM, HFpEF exacerbation and myocarditis following covid.  TTE has been ordered.  Per nursing she does not have an exam consistent with cardiogenic shock at this I  think is reasonable to start low-dose beta-blockade for better heart rate control if this is ACS. Cardiology will consult in the morning and determine whether she needs transfer for invasive coronary assessment following her echo. Complicating her presentation is her dementia which appears to be fairly advanced per discussion with the provider and chart review. Her LDL is still not within goal and I am not sure if she is currently taking atorva 80 mg PO qhs or not (shows d/c on 12/05/20).

## 2020-12-24 NOTE — Progress Notes (Signed)
ANTICOAGULATION CONSULT NOTE - Initial Consult  Pharmacy Consult for heparin Indication: STEMI  Allergies  Allergen Reactions   Allopurinol Hives   Sulfonamide Derivatives Hives    Patient Measurements: Height: 5\' 4"  (162.6 cm) Weight: 70.9 kg (156 lb 4.8 oz) IBW/kg (Calculated) : 54.7 Heparin Dosing Weight: 70.9 kg  Vital Signs: Temp: 97.9 F (36.6 C) (06/14 0233) Temp Source: Oral (06/14 0233) BP: 140/94 (06/14 0233) Pulse Rate: 117 (06/14 0233)  Labs: Recent Labs    12/23/20 1648 12/23/20 1848 12/23/20 2200 12/24/20 0044  HGB 11.0*  --   --  10.4*  HCT 34.8*  --   --  33.6*  PLT 122*  --   --  116*  CREATININE 1.99*  --  1.91* 1.97*  TROPONINIHS 113* 151*  --  451*    Estimated Creatinine Clearance: 19.1 mL/min (A) (by C-G formula based on SCr of 1.97 mg/dL (H)).   Medical History: Past Medical History:  Diagnosis Date   Chest pain    Coronary atherosclerosis of native coronary artery    NSTEMI 11/10, LCx stent   Diabetes mellitus    type II   Gout    Hypertension    Low back pain    chronic   Obesity      Assessment: 87 y.o. female with medical history significant for coronary artery disease status post NSTEMI in 2010 status post drug-eluting stent x1 to left circumflex, chronic diastolic heart failure, type 2 diabetes mellitus, dementia, stage IIIb chronic kidney disease with baseline creatinine 1.5-1.8, essential pretension, hyperlipidemia, chronic iron deficiency anemia with baseline hemoglobin 10-11, recent hospitalization for COVID-19 infection who is admitted to [facility] on 12/23/2020 with acute on chronic diastolic heart failure after presenting from home to [facility] ED complaining of shortness of breath. Pharmacy consulted to dose heparin drip for concern of NSTEMI.  No prior AC noted  12/24/2020 Hgb 10.4, Plts 116 Scr 1.97 Trop 451  Goal of Therapy:  Heparin level 0.3-0.7 units/ml Monitor platelets by anticoagulation protocol: Yes    Plan:  Heparin bolus 3000 units x 1 Heparin drip 850 units/hr Heparin level in 8 hours Daily CBC  12/26/2020 RPh 12/24/2020, 3:03 AM

## 2020-12-25 DIAGNOSIS — I10 Essential (primary) hypertension: Secondary | ICD-10-CM | POA: Diagnosis not present

## 2020-12-25 DIAGNOSIS — I509 Heart failure, unspecified: Secondary | ICD-10-CM | POA: Diagnosis not present

## 2020-12-25 DIAGNOSIS — N179 Acute kidney failure, unspecified: Secondary | ICD-10-CM | POA: Diagnosis not present

## 2020-12-25 DIAGNOSIS — I5021 Acute systolic (congestive) heart failure: Secondary | ICD-10-CM

## 2020-12-25 DIAGNOSIS — R778 Other specified abnormalities of plasma proteins: Secondary | ICD-10-CM | POA: Diagnosis not present

## 2020-12-25 DIAGNOSIS — N189 Chronic kidney disease, unspecified: Secondary | ICD-10-CM

## 2020-12-25 LAB — BASIC METABOLIC PANEL
Anion gap: 11 (ref 5–15)
BUN: 37 mg/dL — ABNORMAL HIGH (ref 8–23)
CO2: 27 mmol/L (ref 22–32)
Calcium: 9 mg/dL (ref 8.9–10.3)
Chloride: 104 mmol/L (ref 98–111)
Creatinine, Ser: 1.89 mg/dL — ABNORMAL HIGH (ref 0.44–1.00)
GFR, Estimated: 25 mL/min — ABNORMAL LOW (ref >60)
Glucose, Bld: 153 mg/dL — ABNORMAL HIGH (ref 70–99)
Potassium: 5.4 mmol/L — ABNORMAL HIGH (ref 3.5–5.1)
Sodium: 142 mmol/L (ref 135–145)

## 2020-12-25 LAB — GLUCOSE, CAPILLARY
Glucose-Capillary: 124 mg/dL — ABNORMAL HIGH (ref 70–99)
Glucose-Capillary: 226 mg/dL — ABNORMAL HIGH (ref 70–99)
Glucose-Capillary: 133 mg/dL — ABNORMAL HIGH (ref 70–99)
Glucose-Capillary: 131 mg/dL — ABNORMAL HIGH (ref 70–99)

## 2020-12-25 LAB — CBC
HCT: 35.9 % — ABNORMAL LOW (ref 36.0–46.0)
Hemoglobin: 11.2 g/dL — ABNORMAL LOW (ref 12.0–15.0)
MCH: 34 pg (ref 26.0–34.0)
MCHC: 31.2 g/dL (ref 30.0–36.0)
MCV: 109.1 fL — ABNORMAL HIGH (ref 80.0–100.0)
Platelets: 112 10*3/uL — ABNORMAL LOW (ref 150–400)
RBC: 3.29 MIL/uL — ABNORMAL LOW (ref 3.87–5.11)
RDW: 14.5 % (ref 11.5–15.5)
WBC: 10.5 10*3/uL (ref 4.0–10.5)
nRBC: 0 % (ref 0.0–0.2)

## 2020-12-25 LAB — HEPARIN LEVEL (UNFRACTIONATED): Heparin Unfractionated: 0.38 IU/mL (ref 0.30–0.70)

## 2020-12-25 LAB — TROPONIN I (HIGH SENSITIVITY)
Troponin I (High Sensitivity): 423 ng/L (ref <18)
Troponin I (High Sensitivity): 420 ng/L (ref <18)

## 2020-12-25 LAB — D-DIMER, QUANTITATIVE: D-Dimer, Quant: 0.91 ug/mL-FEU — ABNORMAL HIGH (ref 0.00–0.50)

## 2020-12-25 MED ORDER — PROSOURCE PLUS PO LIQD
30.0000 mL | Freq: Every day | ORAL | Status: DC
  Administered 2020-12-26 – 2020-12-31 (×6): 30 mL via ORAL
  Filled 2020-12-25 (×7): qty 30

## 2020-12-25 MED ORDER — METOPROLOL TARTRATE 25 MG PO TABS
25.0000 mg | ORAL_TABLET | Freq: Two times a day (BID) | ORAL | Status: DC
  Administered 2020-12-26 – 2020-12-31 (×9): 25 mg via ORAL
  Filled 2020-12-25 (×10): qty 1

## 2020-12-25 MED ORDER — ADULT MULTIVITAMIN W/MINERALS CH
1.0000 | ORAL_TABLET | Freq: Every day | ORAL | Status: DC
  Administered 2020-12-25 – 2020-12-31 (×7): 1 via ORAL
  Filled 2020-12-25 (×7): qty 1

## 2020-12-25 NOTE — Progress Notes (Signed)
    BRIEF OVERNIGHT PROGRESS REPORT  Called for patient yelling out and the appearance of pain. Patient cannot describe pain or any location due to history. EKG completed and no changes found. Patient is currently not yelling.RN is administering medication and will advise any further.      Chinita Greenland MSNA ACNPC-AG Acute Care Nurse Practitioner [facility] Pager [phone] [place]

## 2020-12-25 NOTE — Progress Notes (Signed)
Initial Nutrition Assessment  DOCUMENTATION CODES:   Not applicable  INTERVENTION:  - will order Magic Cup BID with meals, each supplement provides 290 kcal and 9 grams of protein. - will order 30 ml Prosource Plus once/day, each supplement provides 100 kcal and 15 grams protein.  - will order 1 tablet multivitamin with minerals/day.    NUTRITION DIAGNOSIS:   Inadequate oral intake related to acute illness, lethargy/confusion as evidenced by meal completion < 50%  GOAL:   Patient will meet greater than or equal to 90% of their needs  MONITOR:   PO intake, Supplement acceptance, Labs, Weight trends  REASON FOR ASSESSMENT:   Malnutrition Screening Tool  ASSESSMENT:   87 year old female with medical history of CAD, NSTEMI s/p stent placement, type 2 DM, dementia, stage 3 CKD, HTN, COVID-19 PNA in 11/2020 requiring O2 at the time of d/c. she presented to the ED with worsening SOB. Patient was admitted for CHF exacerbation and elevated troponins. Cardiology was consulted.  Patient noted to be a/o to self only. No family/visitors present at the time of visit. She was sleeping but awoke to name call x1 with shoulder rub.  She denied abdominal pain or nausea and reported that she ate breakfast. Patient's report of what was on breakfast tray was inaccurate and she was unable to provide additional details.   Documentation in flow sheet indicates that she ate 25% of breakfast (175 kcal and 7 grams protein).   Per notes, patient is from home with plan to return home at the time of d/c.   Labs reviewed; CBGs: 124 and 226 mg/dl, K: 5.4 mmol/l, BUN: 37 mg/dl, creatinine: 7.82 mg/dl, GFR: 25 ml/min.  Medications reviewed; 40 mg IV lasix/day, sliding scale novolog.    NUTRITION - FOCUSED PHYSICAL EXAM:  Completed; no muscle or fat depletions; mild edema to BLE.   Diet Order:   Diet Order             Diet Heart Room service appropriate? Yes; Fluid consistency: Thin; Fluid  restriction: 1200 mL Fluid  Diet effective now                   EDUCATION NEEDS:   No education needs have been identified at this time  Skin:  Skin Assessment: Reviewed RN Assessment  Last BM:  6/14 (type 4 x1)  Height:   Ht Readings from Last 1 Encounters:  12/23/20 5\' 4"  (1.626 m)    Weight:   Wt Readings from Last 1 Encounters:  12/25/20 70.3 kg      Estimated Nutritional Needs:  Kcal:  1500-1700 kcal Protein:  70-80 grams Fluid:  >/= 1.2 L/day      12/27/20, MS, RD, LDN, CNSC Inpatient Clinical Dietitian RD pager # available in [facility]  After hours/weekend pager # available in [facility]

## 2020-12-25 NOTE — Progress Notes (Addendum)
Patient ID: [name], female   DOB: 04-17-1931, 87 y.o.   MRN: [id number]  PROGRESS NOTE    Arletha Marschke  TKP:[id number] DOB: [birth year] DOA: 12/23/2020 PCP: Rodrigo Ran, MD   Brief Narrative:  87 year old female with history of CAD with non-STEMI and stent placement, diabetes mellitus type 2, dementia, chronic kidney disease stage IIIb, hypertension, COVID-19 pneumonia last month requiring oxygen upon discharge presented with worsening shortness of breath.  She was admitted for CHF exacerbation with troponin elevation.  Cardiology was consulted.  She was started on heparin drip and IV Lasix.  Assessment & Plan:   Acute systolic heart failure NSTEMI/Elevated troponin probably secondary to above History of CAD/MI with prior stenting -Echo shows EF of less than 20%.  Cardiology following.  Currently on IV Lasix and heparin drip.  Continue aspirin, statin and metoprolol. -Strict input and output, daily weights, fluid restriction.  Diabetes mellitus type 2 -Continue CBGs with SSI  Chronic kidney disease stage IIIb -Baseline creatinine of 1.5-2.  Creatinine 1.89 today.  Monitor.  Hyperkalemia -Potassium 5.4 today.  Monitor.  Hypertension -Blood pressure stable.  Continue Lasix, beta-blocker  Thrombocytopenia -Questionable cause.  No signs of bleeding.  Monitor  Chronic macrocytic anemia -Questionable cause.  Check vitamin B12, TSH and folic acid levels  Generalized deconditioning -PT eval  Chronic hypoxic respiratory failure History of recent COVID-19 pneumonia -Currently on 3 L oxygen via nasal cannula.  Incentive spirometry.  Dementia -Pulm oxygen.   DVT prophylaxis: Heparin Code Status: DNR Family Communication: None at bedside Disposition Plan: Status is: Inpatient  Remains inpatient appropriate because:Inpatient level of care appropriate due to severity of illness  Dispo: The patient is from: Home              Anticipated d/c is to: Home              Patient  currently is not medically stable to d/c.   Difficult to place patient No   Consultants: Cardiology  Procedures: Echo as below  Antimicrobials: None   Subjective: Patient seen and examined at bedside.  Wakes up slightly, does not participate in conversation much.  Had chest pains overnight.  Objective: Vitals:   12/24/20 1117 12/24/20 2116 12/25/20 0112 12/25/20 0642  BP: 124/78 130/85 122/89 116/66  Pulse: 85 98 88 75  Resp: (!) 24 20 (!) 24 18  Temp: 98.1 F (36.7 C) 98 F (36.7 C)  98.4 F (36.9 C)  TempSrc:    Oral  SpO2: 96% 99% 95% 98%  Weight:    70.3 kg  Height:        Intake/Output Summary (Last 24 hours) at 12/25/2020 1115 Last data filed at 12/25/2020 0600 Gross per 24 hour  Intake 473.26 ml  Output 450 ml  Net 23.26 ml   Filed Weights   12/23/20 2243 12/24/20 0600 12/25/20 0642  Weight: 70.9 kg 70.4 kg 70.3 kg    Examination:  General exam: Appears calm and comfortable.  Currently on 3 L oxygen via nasal cannula.  Elderly female lying in bed.  Extremely poor historian. Respiratory system: Bilateral decreased breath sounds at bases with basilar crackles Cardiovascular system: S1 & S2 heard, Rate controlled Gastrointestinal system: Abdomen is nondistended, soft and nontender. Normal bowel sounds heard. Extremities: No cyanosis, clubbing; trace lower extremity edema Central nervous system: Wakes up slightly, slightly confused.  No focal neurological deficits. Moving extremities Skin: No rashes, lesions or ulcers Psychiatry: Cannot be assessed because of mental status   Data Reviewed: I  have personally reviewed following labs and imaging studies  CBC: Recent Labs  Lab 12/23/20 1648 12/24/20 0044 12/25/20 0207  WBC 6.7 7.4 10.5  NEUTROABS 5.3  --   --   HGB 11.0* 10.4* 11.2*  HCT 34.8* 33.6* 35.9*  MCV 108.4* 109.4* 109.1*  PLT 122* 116* 112*   Basic Metabolic Panel: Recent Labs  Lab 12/23/20 1648 12/23/20 1848 12/23/20 2200  12/24/20 0044 12/25/20 0207  NA 143  --  144 145 142  K 5.5*  --  4.8 4.8 5.4*  CL 108  --  109 107 104  CO2 23  --  25 27 27   GLUCOSE 132*  --  110* 116* 153*  BUN 34*  --  33* 33* 37*  CREATININE 1.99*  --  1.91* 1.97* 1.89*  CALCIUM 8.6*  --  8.5* 9.0 9.0  MG  --  1.5*  --  2.0  --   PHOS  --   --   --  4.4  --    GFR: Estimated Creatinine Clearance: 19.8 mL/min (A) (by C-G formula based on SCr of 1.89 mg/dL (H)). Liver Function Tests: Recent Labs  Lab 12/23/20 2200 12/24/20 0044  AST 17 18  ALT 12 13  ALKPHOS 83 83  BILITOT 0.5 0.9  PROT 6.4* 6.4*  ALBUMIN 3.7 3.8   No results for input(s): LIPASE, AMYLASE in the last 168 hours. No results for input(s): AMMONIA in the last 168 hours. Coagulation Profile: No results for input(s): INR, PROTIME in the last 168 hours. Cardiac Enzymes: No results for input(s): CKTOTAL, CKMB, CKMBINDEX, TROPONINI in the last 168 hours. BNP (last 3 results) No results for input(s): PROBNP in the last 8760 hours. HbA1C: No results for input(s): HGBA1C in the last 72 hours. CBG: Recent Labs  Lab 12/24/20 0756 12/24/20 1157 12/24/20 1650 12/24/20 2112 12/25/20 0747  GLUCAP 170* 118* 114* 123* 124*   Lipid Profile: Recent Labs    12/24/20 0235  CHOL 184  HDL 60  LDLCALC 105*  TRIG 96  CHOLHDL 3.1   Thyroid Function Tests: No results for input(s): TSH, T4TOTAL, FREET4, T3FREE, THYROIDAB in the last 72 hours. Anemia Panel: No results for input(s): VITAMINB12, FOLATE, FERRITIN, TIBC, IRON, RETICCTPCT in the last 72 hours. Sepsis Labs: Recent Labs  Lab 12/23/20 2200  PROCALCITON <0.10    No results found for this or any previous visit (from the past 240 hour(s)).       Radiology Studies: DG Chest Port 1 View  Result Date: 12/23/2020 CLINICAL DATA:  Shortness of breath EXAM: PORTABLE CHEST 1 VIEW COMPARISON:  12/03/2020 FINDINGS: The heart and mediastinal contours are within normal limits. Diffuse bilateral  interstitial pulmonary opacity and bilateral pleural effusions, increased compared to prior examination. IMPRESSION: Diffuse bilateral interstitial pulmonary opacity and bilateral pleural effusions, increased compared to prior examination, concerning for pulmonary edema. Electronically Signed   By: 12/05/2020 M.D.   On: 12/23/2020 17:12   ECHOCARDIOGRAM COMPLETE  Result Date: 12/24/2020    ECHOCARDIOGRAM REPORT   Patient Name:   HARLEY [facility] Date of Exam: 12/24/2020 Medical Rec #:  12/26/2020  Height:       64.0 in Accession #:    [id number] Weight:       155.1 lb Date of Birth:  06/12/1931  BSA:          1.756 m Patient Age:    89 years   BP:           144/95 mmHg  Patient Gender: F          HR:           88 bpm. Exam Location:  Inpatient Procedure: 2D Echo, Cardiac Doppler and Color Doppler Indications:    CHF  History:        Patient has no prior history of Echocardiogram examinations.                 Previous Myocardial Infarction and CAD, Signs/Symptoms:Chest                 Pain; Risk Factors:Diabetes and Hypertension.  Sonographer:    Neomia Dear RDCS Referring Phys: [id number] JUSTIN B HOWERTER IMPRESSIONS  1. Left ventricular ejection fraction, by estimation, is <20%. The left ventricle has severely decreased function. The left ventricle demonstrates global hypokinesis. The left ventricular internal cavity size was moderately dilated. Left ventricular diastolic parameters are indeterminate.  2. Right ventricular systolic function is normal. The right ventricular size is normal. There is moderately elevated pulmonary artery systolic pressure.  3. Left atrial size was moderately dilated.  4. Moderate pleural effusion in the left lateral region.  5. The mitral valve is normal in structure. Mild to moderate mitral valve regurgitation. No evidence of mitral stenosis.  6. The aortic valve is tricuspid. Aortic valve regurgitation is not visualized. Mild aortic valve sclerosis is present, with no evidence of aortic  valve stenosis.  7. The inferior vena cava is dilated in size with <50% respiratory variability, suggesting right atrial pressure of 15 mmHg. FINDINGS  Left Ventricle: Left ventricular ejection fraction, by estimation, is <20%. The left ventricle has severely decreased function. The left ventricle demonstrates global hypokinesis. The left ventricular internal cavity size was moderately dilated. There is no left ventricular hypertrophy. Left ventricular diastolic parameters are indeterminate. Right Ventricle: The right ventricular size is normal.Right ventricular systolic function is normal. There is moderately elevated pulmonary artery systolic pressure. The tricuspid regurgitant velocity is 3.01 m/s, and with an assumed right atrial pressure of 15 mmHg, the estimated right ventricular systolic pressure is 51.2 mmHg. Left Atrium: Left atrial size was moderately dilated. Right Atrium: Right atrial size was normal in size. Pericardium: Trivial pericardial effusion is present. Mitral Valve: The mitral valve is normal in structure. Mild to moderate mitral valve regurgitation. No evidence of mitral valve stenosis. Tricuspid Valve: The tricuspid valve is normal in structure. Tricuspid valve regurgitation is trivial. No evidence of tricuspid stenosis. Aortic Valve: The aortic valve is tricuspid. Aortic valve regurgitation is not visualized. Mild aortic valve sclerosis is present, with no evidence of aortic valve stenosis. Aortic valve mean gradient measures 2.0 mmHg. Aortic valve peak gradient measures 4.3 mmHg. Aortic valve area, by VTI measures 1.79 cm. Pulmonic Valve: The pulmonic valve was normal in structure. Pulmonic valve regurgitation is trivial. No evidence of pulmonic stenosis. Aorta: The aortic root is normal in size and structure. Venous: The inferior vena cava is dilated in size with less than 50% respiratory variability, suggesting right atrial pressure of 15 mmHg.  Additional Comments: There is a moderate  pleural effusion in the left lateral region.  LEFT VENTRICLE PLAX 2D LVIDd:         5.90 cm LVIDs:         5.80 cm LV PW:         0.90 cm LV IVS:        0.80 cm LVOT diam:     2.20 cm LV SV:  34 LV SV Index:   19 LVOT Area:     3.80 cm  LV Volumes (MOD) LV vol d, MOD A2C: 115.0 ml LV vol d, MOD A4C: 105.0 ml LV vol s, MOD A2C: 87.6 ml LV vol s, MOD A4C: 95.3 ml LV SV MOD A2C:     27.4 ml LV SV MOD A4C:     105.0 ml LV SV MOD BP:      18.1 ml RIGHT VENTRICLE RV Basal diam:  2.90 cm RV Mid diam:    2.10 cm RV S prime:     9.90 cm/s TAPSE (M-mode): 1.4 cm LEFT ATRIUM           Index       RIGHT ATRIUM           Index LA diam:      4.40 cm 2.51 cm/m  RA Area:     11.10 cm LA Vol (A2C): 72.8 ml 41.46 ml/m RA Volume:   23.30 ml  13.27 ml/m  AORTIC VALVE                   PULMONIC VALVE AV Area (Vmax):    2.00 cm    PV Vmax:       0.76 m/s AV Area (Vmean):   1.83 cm    PV Vmean:      51.300 cm/s AV Area (VTI):     1.79 cm    PV VTI:        0.118 m AV Vmax:           104.00 cm/s PV Peak grad:  2.3 mmHg AV Vmean:          71.700 cm/s PV Mean grad:  1.0 mmHg AV VTI:            0.187 m AV Peak Grad:      4.3 mmHg AV Mean Grad:      2.0 mmHg LVOT Vmax:         54.70 cm/s LVOT Vmean:        34.500 cm/s LVOT VTI:          0.088 m LVOT/AV VTI ratio: 0.47  AORTA Ao Root diam: 2.90 cm Ao Asc diam:  3.00 cm MITRAL VALVE                 TRICUSPID VALVE MV Area (PHT): 5.23 cm      TR Peak grad:   36.2 mmHg MV Decel Time: 145 msec      TR Vmax:        301.00 cm/s MR Peak grad:    79.6 mmHg MR Mean grad:    54.0 mmHg   SHUNTS MR Vmax:         446.00 cm/s Systemic VTI:  0.09 m MR Vmean:        346.0 cm/s  Systemic Diam: 2.20 cm MR PISA:         1.57 cm MR PISA Eff ROA: 8 mm MR PISA Radius:  0.50 cm MV E velocity: 63.40 cm/s MV A velocity: 67.80 cm/s MV E/A ratio:  0.94 Olga MillersBrian Crenshaw MD Electronically signed by Olga MillersBrian Crenshaw MD Signature Date/Time: 12/24/2020/1:55:31 PM    Final         Scheduled Meds:  aspirin EC   81 mg Oral Daily   atorvastatin  80 mg Oral QHS   furosemide  40 mg Intravenous Daily   insulin aspart  0-6 Units Subcutaneous TID WC  mouth rinse  15 mL Mouth Rinse BID   metoprolol tartrate  12.5 mg Oral BID   Continuous Infusions:  heparin 800 Units/hr (12/24/20 2146)          Glade Lloyd, MD [facility] 12/25/2020, 11:15 AM

## 2020-12-25 NOTE — Progress Notes (Addendum)
Progress Note  Patient Name: [name] Date of Encounter: 12/25/2020  [facility] Cardiologist: Donato Schultz, MD   Subjective   No chest pain or SOB   Inpatient Medications    Scheduled Meds:  aspirin EC  81 mg Oral Daily   atorvastatin  80 mg Oral QHS   furosemide  40 mg Intravenous Daily   insulin aspart  0-6 Units Subcutaneous TID WC   mouth rinse  15 mL Mouth Rinse BID   metoprolol tartrate  12.5 mg Oral BID   Continuous Infusions:  heparin 800 Units/hr (12/24/20 2146)   PRN Meds: acetaminophen **OR** acetaminophen, ipratropium-albuterol   Vital Signs    Vitals:   12/24/20 1117 12/24/20 2116 12/25/20 0112 12/25/20 0642  BP: 124/78 130/85 122/89 116/66  Pulse: 85 98 88 75  Resp: (!) 24 20 (!) 24 18  Temp: 98.1 F (36.7 C) 98 F (36.7 C)  98.4 F (36.9 C)  TempSrc:    Oral  SpO2: 96% 99% 95% 98%  Weight:    70.3 kg  Height:        Intake/Output Summary (Last 24 hours) at 12/25/2020 0938 Last data filed at 12/25/2020 0600 Gross per 24 hour  Intake 473.26 ml  Output 450 ml  Net 23.26 ml   Last 3 Weights 12/25/2020 12/24/2020 12/23/2020  Weight (lbs) 154 lb 15.7 oz 155 lb 1.6 oz 156 lb 4.8 oz  Weight (kg) 70.3 kg 70.353 kg 70.897 kg      Telemetry    20 beats of NSVT last evening.  Short burst rare as well 3-4 beats - Personally Reviewed  ECG    SR with LAFB and no ST changes occ PVC - Personally Reviewed  Physical Exam   GEN: No acute distress.   Neck: No JVD sitting up in bed Cardiac: RRR, no murmurs, rubs, or gallops.  Respiratory: rhonchi and wheezes to auscultation bilaterally. GI: Soft, nontender, non-distended  MS: No edema; No deformity. Neuro:  Nonfocal  Psych: Normal affect   Labs    High Sensitivity Troponin:   Recent Labs  Lab 12/24/20 0044 12/24/20 0235 12/24/20 0427 12/25/20 0207 12/25/20 0407  TROPONINIHS 451* 546* 557* 423* 420*      Chemistry Recent Labs  Lab 12/23/20 2200 12/24/20 0044 12/25/20 0207  NA  144 145 142  K 4.8 4.8 5.4*  CL 109 107 104  CO2 25 27 27   GLUCOSE 110* 116* 153*  BUN 33* 33* 37*  CREATININE 1.91* 1.97* 1.89*  CALCIUM 8.5* 9.0 9.0  PROT 6.4* 6.4*  --   ALBUMIN 3.7 3.8  --   AST 17 18  --   ALT 12 13  --   ALKPHOS 83 83  --   BILITOT 0.5 0.9  --   GFRNONAA 25* 24* 25*  ANIONGAP 10 11 11      Hematology Recent Labs  Lab 12/23/20 1648 12/24/20 0044 12/25/20 0207  WBC 6.7 7.4 10.5  RBC 3.21* 3.07* 3.29*  HGB 11.0* 10.4* 11.2*  HCT 34.8* 33.6* 35.9*  MCV 108.4* 109.4* 109.1*  MCH 34.3* 33.9 34.0  MCHC 31.6 31.0 31.2  RDW 14.6 14.6 14.5  PLT 122* 116* 112*    BNP Recent Labs  Lab 12/23/20 1648  BNP 106.8*     DDimer  Recent Labs  Lab 12/24/20 0427 12/25/20 0207  DDIMER 1.66* 0.91*     Radiology    DG Chest Port 1 View  Result Date: 12/23/2020 CLINICAL DATA:  Shortness of breath  EXAM: PORTABLE CHEST 1 VIEW COMPARISON:  12/03/2020 FINDINGS: The heart and mediastinal contours are within normal limits. Diffuse bilateral interstitial pulmonary opacity and bilateral pleural effusions, increased compared to prior examination. IMPRESSION: Diffuse bilateral interstitial pulmonary opacity and bilateral pleural effusions, increased compared to prior examination, concerning for pulmonary edema. Electronically Signed   By: Lauralyn PrimesAlex  Bibbey M.D.   On: 12/23/2020 17:12   ECHOCARDIOGRAM COMPLETE  Result Date: 12/24/2020    ECHOCARDIOGRAM REPORT   [name] Date of Exam: 12/24/2020 Medical Rec #:  [id number]  Height:       64.0 in Accession #:    [id number] Weight:       155.1 lb Date of Birth:  29-Jan-1931  BSA:          1.756 m Patient Age:    87 years   BP:           144/95 mmHg Patient Gender: F          HR:           88 bpm. Exam Location:  Inpatient Procedure: 2D Echo, Cardiac Doppler and Color Doppler Indications:    CHF  History:        Patient has no prior history of Echocardiogram examinations.                 Previous Myocardial Infarction  and CAD, Signs/Symptoms:Chest                 Pain; Risk Factors:Diabetes and Hypertension.  Sonographer:    Neomia DearAMARA CROWN RDCS Referring Phys: [id number] JUSTIN B HOWERTER IMPRESSIONS  1. Left ventricular ejection fraction, by estimation, is <20%. The left ventricle has severely decreased function. The left ventricle demonstrates global hypokinesis. The left ventricular internal cavity size was moderately dilated. Left ventricular diastolic parameters are indeterminate.  2. Right ventricular systolic function is normal. The right ventricular size is normal. There is moderately elevated pulmonary artery systolic pressure.  3. Left atrial size was moderately dilated.  4. Moderate pleural effusion in the left lateral region.  5. The mitral valve is normal in structure. Mild to moderate mitral valve regurgitation. No evidence of mitral stenosis.  6. The aortic valve is tricuspid. Aortic valve regurgitation is not visualized. Mild aortic valve sclerosis is present, with no evidence of aortic valve stenosis.  7. The inferior vena cava is dilated in size with <50% respiratory variability, suggesting right atrial pressure of 15 mmHg. FINDINGS  Left Ventricle: Left ventricular ejection fraction, by estimation, is <20%. The left ventricle has severely decreased function. The left ventricle demonstrates global hypokinesis. The left ventricular internal cavity size was moderately dilated. There is no left ventricular hypertrophy. Left ventricular diastolic parameters are indeterminate. Right Ventricle: The right ventricular size is normal.Right ventricular systolic function is normal. There is moderately elevated pulmonary artery systolic pressure. The tricuspid regurgitant velocity is 3.01 m/s, and with an assumed right atrial pressure of 15 mmHg, the estimated right ventricular systolic pressure is 51.2 mmHg. Left Atrium: Left atrial size was moderately dilated. Right Atrium: Right atrial size was normal in size. Pericardium:  Trivial pericardial effusion is present. Mitral Valve: The mitral valve is normal in structure. Mild to moderate mitral valve regurgitation. No evidence of mitral valve stenosis. Tricuspid Valve: The tricuspid valve is normal in structure. Tricuspid valve regurgitation is trivial. No evidence of tricuspid stenosis. Aortic Valve: The aortic valve is tricuspid. Aortic valve regurgitation is not visualized. Mild aortic valve sclerosis is present, with  no evidence of aortic valve stenosis. Aortic valve mean gradient measures 2.0 mmHg. Aortic valve peak gradient measures 4.3 mmHg. Aortic valve area, by VTI measures 1.79 cm. Pulmonic Valve: The pulmonic valve was normal in structure. Pulmonic valve regurgitation is trivial. No evidence of pulmonic stenosis. Aorta: The aortic root is normal in size and structure. Venous: The inferior vena cava is dilated in size with less than 50% respiratory variability, suggesting right atrial pressure of 15 mmHg.  Additional Comments: There is a moderate pleural effusion in the left lateral region.  LEFT VENTRICLE PLAX 2D LVIDd:         5.90 cm LVIDs:         5.80 cm LV PW:         0.90 cm LV IVS:        0.80 cm LVOT diam:     2.20 cm LV SV:         34 LV SV Index:   19 LVOT Area:     3.80 cm  LV Volumes (MOD) LV vol d, MOD A2C: 115.0 ml LV vol d, MOD A4C: 105.0 ml LV vol s, MOD A2C: 87.6 ml LV vol s, MOD A4C: 95.3 ml LV SV MOD A2C:     27.4 ml LV SV MOD A4C:     105.0 ml LV SV MOD BP:      18.1 ml RIGHT VENTRICLE RV Basal diam:  2.90 cm RV Mid diam:    2.10 cm RV S prime:     9.90 cm/s TAPSE (M-mode): 1.4 cm LEFT ATRIUM           Index       RIGHT ATRIUM           Index LA diam:      4.40 cm 2.51 cm/m  RA Area:     11.10 cm LA Vol (A2C): 72.8 ml 41.46 ml/m RA Volume:   23.30 ml  13.27 ml/m  AORTIC VALVE                   PULMONIC VALVE AV Area (Vmax):    2.00 cm    PV Vmax:       0.76 m/s AV Area (Vmean):   1.83 cm    PV Vmean:      51.300 cm/s AV Area (VTI):     1.79 cm    PV  VTI:        0.118 m AV Vmax:           104.00 cm/s PV Peak grad:  2.3 mmHg AV Vmean:          71.700 cm/s PV Mean grad:  1.0 mmHg AV VTI:            0.187 m AV Peak Grad:      4.3 mmHg AV Mean Grad:      2.0 mmHg LVOT Vmax:         54.70 cm/s LVOT Vmean:        34.500 cm/s LVOT VTI:          0.088 m LVOT/AV VTI ratio: 0.47  AORTA Ao Root diam: 2.90 cm Ao Asc diam:  3.00 cm MITRAL VALVE                 TRICUSPID VALVE MV Area (PHT): 5.23 cm      TR Peak grad:   36.2 mmHg MV Decel Time: 145 msec      TR Vmax:  301.00 cm/s MR Peak grad:    79.6 mmHg MR Mean grad:    54.0 mmHg   SHUNTS MR Vmax:         446.00 cm/s Systemic VTI:  0.09 m MR Vmean:        346.0 cm/s  Systemic Diam: 2.20 cm MR PISA:         1.57 cm MR PISA Eff ROA: 8 mm MR PISA Radius:  0.50 cm MV E velocity: 63.40 cm/s MV A velocity: 67.80 cm/s MV E/A ratio:  0.94 Olga Millers MD Electronically signed by Olga Millers MD Signature Date/Time: 12/24/2020/1:55:31 PM    Final     Cardiac Studies   Echo 12/24/20 IMPRESSIONS     1. Left ventricular ejection fraction, by estimation, is <20%. The left  ventricle has severely decreased function. The left ventricle demonstrates  global hypokinesis. The left ventricular internal cavity size was  moderately dilated. Left ventricular  diastolic parameters are indeterminate.   2. Right ventricular systolic function is normal. The right ventricular  size is normal. There is moderately elevated pulmonary artery systolic  pressure.   3. Left atrial size was moderately dilated.   4. Moderate pleural effusion in the left lateral region.   5. The mitral valve is normal in structure. Mild to moderate mitral valve  regurgitation. No evidence of mitral stenosis.   6. The aortic valve is tricuspid. Aortic valve regurgitation is not  visualized. Mild aortic valve sclerosis is present, with no evidence of  aortic valve stenosis.   7. The inferior vena cava is dilated in size with <50% respiratory   variability, suggesting right atrial pressure of 15 mmHg.   FINDINGS   Left Ventricle: Left ventricular ejection fraction, by estimation, is  <20%. The left ventricle has severely decreased function. The left  ventricle demonstrates global hypokinesis. The left ventricular internal  cavity size was moderately dilated. There  is no left ventricular hypertrophy. Left ventricular diastolic parameters  are indeterminate.   Right Ventricle: The right ventricular size is normal.Right ventricular  systolic function is normal. There is moderately elevated pulmonary artery  systolic pressure. The tricuspid regurgitant velocity is 3.01 m/s, and  with an assumed right atrial  pressure of 15 mmHg, the estimated right ventricular systolic pressure is  51.2 mmHg.   Left Atrium: Left atrial size was moderately dilated.   Right Atrium: Right atrial size was normal in size.   Pericardium: Trivial pericardial effusion is present.   Mitral Valve: The mitral valve is normal in structure. Mild to moderate  mitral valve regurgitation. No evidence of mitral valve stenosis.   Tricuspid Valve: The tricuspid valve is normal in structure. Tricuspid  valve regurgitation is trivial. No evidence of tricuspid stenosis.   Aortic Valve: The aortic valve is tricuspid. Aortic valve regurgitation is  not visualized. Mild aortic valve sclerosis is present, with no evidence  of aortic valve stenosis. Aortic valve mean gradient measures 2.0 mmHg.  Aortic valve peak gradient  measures 4.3 mmHg. Aortic valve area, by VTI measures 1.79 cm.   Pulmonic Valve: The pulmonic valve was normal in structure. Pulmonic valve  regurgitation is trivial. No evidence of pulmonic stenosis.   Aorta: The aortic root is normal in size and structure.   Venous: The inferior vena cava is dilated in size with less than 50%  respiratory variability, suggesting right atrial pressure of 15 mmHg.     Additional Comments: There is a  moderate pleural effusion in the left  lateral  region.   Patient Profile     87 y.o. female with a hx of CAD, hx NSTEMI 2010, with  DES to LCX, SM-2, HTN, Gout, dementia and had COVID 19 PNA 12/03/20 now with CHF and new cardiomyopathy.  Assessment & Plan    Elevated troponin x of CAD and prior DES to LCX in 2010 with MI.  No acute EKG changes.  new CM with EF < 20%  May be from CHF post COVID 19 PNA vs ischemia.  Has been loaded with ASA and on IV heparin.  BB added and on lipitor 80  no chest pain  now troponins are elevated but flat.   Stop heparin after 48 hours? Defer to MD Acute CHF/CM new- after recent COVID 19 PNA on lasix 40 daily and neg 1549 cc.  Weight down 0.6 Kg.   though at discharge last admit wt 82 Kg.   will add cozaar back once diuresed. Hypoxia per IM due to chf  continues on 3 L [place]. NSVT up to 20 beats, Mg+ and K+ WNL   Dementia if cardiac cath is needed would need to discuss with family as pt's dementia fairly advanced  pt is DNR- husband stated whatever needed to be done. But Dr. Anne Fu talked with husband and preferred medical management HLD on lipitor  Elevated ddimer per IM currently on IV heparin AKI on chronic with Cr 1.99 on admit and today 1.97-> 1.89 home cozaar on hold DM-2 per IM       For questions or updates, please contact [facility] Please consult [email or web address] for contact info under        Signed, Nada Boozer, NP  12/25/2020, 9:38 AM    Personally seen and examined. Agree with above.  Non-ST elevation myocardial infarction with EF now less than 20% new cardiomyopathy, acute systolic heart failure  - She has an increase cardiovascular morbidity mortality risk.  Given her DNR status as well as dementia and no current anginal symptoms, continue with medical management.  Discontinue heparin after 48 hours. -Continue with aspirin 81 atorvastatin 80 metoprolol increased to 25 twice daily.  Consolidate to 50 mg Toprol closer to  discharge. -Increased metoprolol will help with nonsustained ventricular tachycardia as well.  Nonsustained ventricular tachycardia noted in the setting of significant cardiomyopathy as well.  She is not a candidate for ICD.  Hopefully be able to add back angiotensin receptor blocker tomorrow.  Likely does not have the blood pressure to tolerate Entresto. -Doing well with IV Lasix 40 mg a day.  No significant shortness of breath.  Donato Schultz, MD

## 2020-12-25 NOTE — Progress Notes (Signed)
ANTICOAGULATION CONSULT NOTE - follow up  Pharmacy Consult for heparin Indication: possible NSTEMI  Allergies  Allergen Reactions   Allopurinol Hives   Sulfonamide Derivatives Hives    Patient Measurements: Height: 5\' 4"  (162.6 cm) Weight: 70.4 kg (155 lb 1.6 oz) IBW/kg (Calculated) : 54.7 Heparin Dosing Weight: 70.9 kg  Vital Signs: Temp: 98 F (36.7 C) (06/14 2116) BP: 122/89 (06/15 0112) Pulse Rate: 88 (06/15 0112)  Labs: Recent Labs    12/23/20 1648 12/23/20 1848 12/23/20 2200 12/24/20 0044 12/24/20 0235 12/24/20 0427 12/24/20 1208 12/24/20 1947 12/25/20 0207  HGB 11.0*  --   --  10.4*  --   --   --   --  11.2*  HCT 34.8*  --   --  33.6*  --   --   --   --  35.9*  PLT 122*  --   --  116*  --   --   --   --  112*  HEPARINUNFRC  --   --   --   --   --   --  0.55 0.66 0.38  CREATININE 1.99*  --  1.91* 1.97*  --   --   --   --  1.89*  TROPONINIHS 113*   < >  --  451* 546* 557*  --   --  423*   < > = values in this interval not displayed.     Estimated Creatinine Clearance: 19.8 mL/min (A) (by C-G formula based on SCr of 1.89 mg/dL (H)).   Medical History: Past Medical History:  Diagnosis Date   Chest pain    Coronary atherosclerosis of native coronary artery    NSTEMI 11/10, LCx stent   Diabetes mellitus    type II   Gout    Hypertension    Low back pain    chronic   Obesity      Assessment: 87 y.o. female with medical history significant for coronary artery disease status post NSTEMI in 2010 status post drug-eluting stent x1 to left circumflex, chronic diastolic heart failure, type 2 diabetes mellitus, dementia, stage IIIb chronic kidney disease with baseline creatinine 1.5-1.8, essential pretension, hyperlipidemia, chronic iron deficiency anemia with baseline hemoglobin 10-11, recent hospitalization for COVID-19 infection who is admitted to [facility] on 12/23/2020 with acute on chronic diastolic heart failure after presenting from home to  [facility] ED complaining of shortness of breath. Pharmacy consulted to dose heparin drip for concern of NSTEMI.  No prior AC noted  Heparin level therapeutic (0.38) on 800 units/hr CBC ok SCr 1.89 No reported bleeding  Goal of Therapy:  Heparin level 0.3-0.7 units/ml Monitor platelets by anticoagulation protocol: Yes   Plan:  Continue heparin drip at 0800 units/hr Daily CBC and HL  [facility] RPh 12/25/2020, 4:32 AM

## 2020-12-25 NOTE — Evaluation (Signed)
Physical Therapy Evaluation Patient Details Name: [name] MRN: [id number] DOB: 1931/03/17 Today's Date: 12/25/2020   History of Present Illness  [name] is an 87 y.o. female who presents with c/o SOB. She was admitted for CHF exacerbation with troponin elevation. Pt recently hospitalized at [facility] from 12/03/20 - 12/05/20 for COVID-19 infection. PMH: NSTEMI 2010 with stent placement, HTN, LBP, diabetes, CHF, dementia, CKD  Clinical Impression  Pt admitted with above diagnosis. Pt reports being independent with RW at baseline at home, but also reports living at home with spouse, 2 kids and being 32 years old- no family present to confirm. Pt currently rqeuiring min-mod A with with RW, requests to sit in recliner and declines further ambulation. Pt denies chest pain, dizziness, racing sensation, any pain at all multiple times during session and ultimately states "honey, I'm fine". Pt on 3L O2 with SpO2 100%- RN notified. Pt will need 24 hr assist, if family unable to provide pt will need SNF for rehab prior to return home. Pt currently with functional limitations due to the deficits listed below (see PT Problem List). Pt will benefit from skilled PT to increase their independence and safety with mobility to allow discharge to the venue listed below.       Follow Up Recommendations Supervision/Assistance - 24 hour (SNF vs HHPT pending family availability to assist)    Equipment Recommendations  None recommended by PT    Recommendations for Other Services       Precautions / Restrictions Precautions Precautions: Fall Restrictions Weight Bearing Restrictions: No      Mobility  Bed Mobility Overal bed mobility: Needs Assistance Bed Mobility: Supine to Sit Rolling: Min assist  General bed mobility comments: min A to fully upright trunk and scoot out to EOB, pt using bedrails with HOB elevated    Transfers Overall transfer level: Needs assistance Equipment used: Rolling walker (2  wheeled) Transfers: Sit to/from [employer] Sit to Stand: Mod assist Stand pivot transfers: Min assist  General transfer comment: mod A to assist with power up using bed pad, min A to steady with pivoting over to recliner, significant forward flexed trunk over RW with fair improvement when cued for upright posture  Ambulation/Gait Ambulation/Gait assistance: Mod assist  Assistive device: Rolling walker (2 wheeled)  Gait velocity: decreased  General Gait Details: pt able to take slow, shuffling, unsteady steps at bedside, requests to sit in recliner and declines ambulation at this time  Stairs            Wheelchair Mobility    Modified Rankin (Stroke Patients Only)       Balance Overall balance assessment: Needs assistance Sitting-balance support: Feet supported Sitting balance-Leahy Scale: Fair Sitting balance - Comments: seated EOB, posterior pelvic tilt with flexed/slouched posture   Standing balance support: During functional activity;Bilateral upper extremity supported Standing balance-Leahy Scale: Poor Standing balance comment: reliant on UE support          Pertinent Vitals/Pain Pain Assessment: No/denies pain    Home Living Family/patient expects to be discharged to:: Private residence Living Arrangements: Spouse/significant other Available Help at Discharge: Family;Available 24 hours/day Type of Home: House Home Access: Level entry     Home Layout: One level Home Equipment: Grab bars - tub/shower;Bedside commode;Walker - 2 wheels;Cane - single point Additional Comments: All information obtained from chart review; pt reports being 87 years old and lives at home with spouse and 2 kids    Prior Function Level of Independence: Independent with  assistive device(s)  Comments: Per pt, she is using RW at home- no family present to confirm     Hand Dominance        Extremity/Trunk Assessment   Upper Extremity Assessment Upper Extremity  Assessment: Generalized weakness    Lower Extremity Assessment Lower Extremity Assessment: Generalized weakness    Cervical / Trunk Assessment Cervical / Trunk Assessment: Kyphotic  Communication   Communication: HOH  Cognition Arousal/Alertness: Awake/alert Behavior During Therapy: WFL for tasks assessed/performed Overall Cognitive Status: History of cognitive impairments - at baseline  General Comments: Pt pleasant, oriented to self, location and situation, aware month is June but states she is 87 years old and the current year is 56. Pt asks therapist multiple times who she is and if therapist has called pt's spouse, pt easy to redirect, reorient and encourage with therapy.      General Comments General comments (skin integrity, edema, etc.): Pt on 3L O2 with SpO2 100%    Exercises     Assessment/Plan    PT Assessment Patient needs continued PT services  PT Problem List Decreased strength;Decreased activity tolerance;Decreased balance;Decreased mobility;Decreased cognition;Decreased range of motion;Cardiopulmonary status limiting activity       PT Treatment Interventions DME instruction;Gait training;Functional mobility training;Therapeutic activities;Therapeutic exercise;Balance training;Cognitive remediation;Patient/family education    PT Goals (Current goals can be found in the Care Plan section)  Acute Rehab PT Goals Patient Stated Goal: "get out of this bed" PT Goal Formulation: With patient Time For Goal Achievement: 01/08/21 Potential to Achieve Goals: Good    Frequency Min 3X/week   Barriers to discharge        Co-evaluation               AM-PAC PT "6 Clicks" Mobility  Outcome Measure Help needed turning from your back to your side while in a flat bed without using bedrails?: A Little Help needed moving from lying on your back to sitting on the side of a flat bed without using bedrails?: A Little Help needed moving to and from a bed to a chair  (including a wheelchair)?: A Little Help needed standing up from a chair using your arms (e.g., wheelchair or bedside chair)?: A Little Help needed to walk in hospital room?: A Little Help needed climbing 3-5 steps with a railing? : A Lot 6 Click Score: 17    End of Session Equipment Utilized During Treatment: Gait belt Activity Tolerance: Patient tolerated treatment well;Patient limited by fatigue Patient left: in chair;with call bell/phone within reach;with chair alarm set Nurse Communication: Mobility status PT Visit Diagnosis: Unsteadiness on feet (R26.81);Other abnormalities of gait and mobility (R26.89);Muscle weakness (generalized) (M62.81)    Time: 1950-9326 PT Time Calculation (min) (ACUTE ONLY): 24 min   Charges:   PT Evaluation $PT Eval Low Complexity: 1 Low PT Treatments $Therapeutic Activity: 8-22 mins        [name] PT, DPT 12/25/20, 3:30 PM

## 2020-12-25 NOTE — Plan of Care (Signed)

## 2020-12-26 ENCOUNTER — Inpatient Hospital Stay (HOSPITAL_COMMUNITY): Payer: Medicare Other

## 2020-12-26 DIAGNOSIS — I10 Essential (primary) hypertension: Secondary | ICD-10-CM | POA: Diagnosis not present

## 2020-12-26 DIAGNOSIS — N179 Acute kidney failure, unspecified: Secondary | ICD-10-CM | POA: Diagnosis not present

## 2020-12-26 DIAGNOSIS — R778 Other specified abnormalities of plasma proteins: Secondary | ICD-10-CM | POA: Diagnosis not present

## 2020-12-26 DIAGNOSIS — I214 Non-ST elevation (NSTEMI) myocardial infarction: Secondary | ICD-10-CM

## 2020-12-26 DIAGNOSIS — I5033 Acute on chronic diastolic (congestive) heart failure: Secondary | ICD-10-CM | POA: Diagnosis not present

## 2020-12-26 LAB — BASIC METABOLIC PANEL
Anion gap: 10 (ref 5–15)
BUN: 46 mg/dL — ABNORMAL HIGH (ref 8–23)
CO2: 26 mmol/L (ref 22–32)
Calcium: 8.6 mg/dL — ABNORMAL LOW (ref 8.9–10.3)
Chloride: 105 mmol/L (ref 98–111)
Creatinine, Ser: 2.46 mg/dL — ABNORMAL HIGH (ref 0.44–1.00)
GFR, Estimated: 18 mL/min — ABNORMAL LOW (ref >60)
Glucose, Bld: 107 mg/dL — ABNORMAL HIGH (ref 70–99)
Potassium: 4.8 mmol/L (ref 3.5–5.1)
Sodium: 141 mmol/L (ref 135–145)

## 2020-12-26 LAB — TSH: TSH: 3.772 u[IU]/mL (ref 0.350–4.500)

## 2020-12-26 LAB — CBC
HCT: 43.6 % (ref 36.0–46.0)
Hemoglobin: 13.8 g/dL (ref 12.0–15.0)
MCH: 33.3 pg (ref 26.0–34.0)
MCHC: 31.7 g/dL (ref 30.0–36.0)
MCV: 105.3 fL — ABNORMAL HIGH (ref 80.0–100.0)
Platelets: 77 10*3/uL — ABNORMAL LOW (ref 150–400)
RBC: 4.14 MIL/uL (ref 3.87–5.11)
RDW: 14.8 % (ref 11.5–15.5)
WBC: 5.3 10*3/uL (ref 4.0–10.5)
nRBC: 0 % (ref 0.0–0.2)

## 2020-12-26 LAB — GLUCOSE, CAPILLARY
Glucose-Capillary: 108 mg/dL — ABNORMAL HIGH (ref 70–99)
Glucose-Capillary: 121 mg/dL — ABNORMAL HIGH (ref 70–99)
Glucose-Capillary: 195 mg/dL — ABNORMAL HIGH (ref 70–99)
Glucose-Capillary: 131 mg/dL — ABNORMAL HIGH (ref 70–99)

## 2020-12-26 LAB — VITAMIN B12: Vitamin B-12: 471 pg/mL (ref 180–914)

## 2020-12-26 LAB — FOLATE: Folate: 6.5 ng/mL (ref >5.9)

## 2020-12-26 MED ORDER — HEPARIN SODIUM (PORCINE) 5000 UNIT/ML IJ SOLN: 5000.0000 [IU] | Freq: Three times a day (TID) | INTRAMUSCULAR | Status: DC

## 2020-12-26 MED ORDER — MELATONIN 5 MG PO TABS
5.0000 mg | ORAL_TABLET | Freq: Once | ORAL | Status: AC
  Administered 2020-12-26: 5 mg via ORAL
  Filled 2020-12-26: qty 1

## 2020-12-26 NOTE — Progress Notes (Signed)
Patient ID: [name], female   DOB: 04/03/1931, 87 y.o.   MRN: [id number]  PROGRESS NOTE    [name]  EQA:[id number] DOB: 22-Oct-1930 DOA: 12/23/2020 PCP: Rodrigo Ran, MD   Brief Narrative:  87 year old female with history of CAD with non-STEMI and stent placement, diabetes mellitus type 2, dementia, chronic kidney disease stage IIIb, hypertension, COVID-19 pneumonia last month requiring oxygen upon discharge presented with worsening shortness of breath.  She was admitted for CHF exacerbation with troponin elevation.  Cardiology was consulted.  She was started on heparin drip and IV Lasix.  Assessment & Plan:   Acute systolic heart failure NSTEMI/Elevated troponin probably secondary to above History of CAD/MI with prior stenting -Echo shows EF of less than 20%.  Cardiology following.  Currently on IV Lasix.  Continue aspirin, statin and metoprolol.  Heparin drip has been discontinued by cardiology. -Strict input and output, daily weights, fluid restriction.  Negative fluid balance of 1365.4 cc since admission.  Diabetes mellitus type 2 -Continue CBGs with SSI  Acute kidney injury on chronic kidney disease stage IIIb -Baseline creatinine of 1.5-2.  Creatinine 2.46 today.  Monitor.  Hyperkalemia -Resolved.  Hypertension -Blood pressure on the lower side.  Continue Lasix, beta-blocker  Thrombocytopenia -Questionable cause.  No signs of bleeding.  Monitor  Chronic macrocytic anemia -Questionable cause.  TSH, folate and vitamin B12 levels normal  Generalized deconditioning -PT recommends SNF versus 24-hour supervision at home.  TOC consult.  Chronic hypoxic respiratory failure History of recent COVID-19 pneumonia -Currently on 3 L oxygen via nasal cannula.  Incentive spirometry.  Dementia -Fall precautions  DVT prophylaxis: Heparin drip discontinued by cardiology. Code Status: DNR Family Communication: None at bedside Disposition Plan: Status is: Inpatient  Remains  inpatient appropriate because:Inpatient level of care appropriate due to severity of illness  Dispo: The patient is from: Home              Anticipated d/c is to: Home              Patient currently is not medically stable to d/c.   Difficult to place patient No   Consultants: Cardiology  Procedures: Echo as below  Antimicrobials: None   Subjective: Patient seen and examined at bedside.  Extremely poor historian.  No overnight fever, vomiting or worsening shortness of breath reported.  Objective: Vitals:   12/25/20 1511 12/25/20 1951 12/25/20 2139 12/26/20 0421  BP: (!) 83/52 (!) 92/55 97/61 99/65   Pulse: 73 75 76 75  Resp: 16 20 20 18   Temp: 97.8 F (36.6 C) 98.3 F (36.8 C)  98.2 F (36.8 C)  TempSrc:  Oral    SpO2: 100% 99% 100% 100%  Weight:      Height:        Intake/Output Summary (Last 24 hours) at 12/26/2020 0751 Last data filed at 12/26/2020 0600 Gross per 24 hour  Intake 583.73 ml  Output 400 ml  Net 183.73 ml    Filed Weights   12/23/20 2243 12/24/20 0600 12/25/20 0642  Weight: 70.9 kg 70.4 kg 70.3 kg    Examination:  General exam: Elderly female lying in bed.  No distress.  Still on 3 L oxygen by nasal cannula.  Extremely poor historian. Respiratory system: Decreased breath sounds at bases bilaterally with bibasilar crackles  cardiovascular system: Currently rate controlled; S1-S2 heard  Gastrointestinal system: Abdomen is slightly distended, soft and nontender.  Bowel sounds are heard  extremities: Mild lower extremity edema present; no clubbing Central nervous system: Confused  slightly; hardly participates in conversation.  No focal neurological deficits.  Moves extremities Skin: No obvious ecchymosis/rashes  psychiatry: Could not be assessed because of mental status   Data Reviewed: I have personally reviewed following labs and imaging studies  CBC: Recent Labs  Lab 12/23/20 1648 12/24/20 0044 12/25/20 0207 12/26/20 0359  WBC 6.7 7.4  10.5 5.3  NEUTROABS 5.3  --   --   --   HGB 11.0* 10.4* 11.2* 13.8  HCT 34.8* 33.6* 35.9* 43.6  MCV 108.4* 109.4* 109.1* 105.3*  PLT 122* 116* 112* 77*    Basic Metabolic Panel: Recent Labs  Lab 12/23/20 1648 12/23/20 1848 12/23/20 2200 12/24/20 0044 12/25/20 0207 12/26/20 0359  NA 143  --  144 145 142 141  K 5.5*  --  4.8 4.8 5.4* 4.8  CL 108  --  109 107 104 105  CO2 23  --  25 27 27 26   GLUCOSE 132*  --  110* 116* 153* 107*  BUN 34*  --  33* 33* 37* 46*  CREATININE 1.99*  --  1.91* 1.97* 1.89* 2.46*  CALCIUM 8.6*  --  8.5* 9.0 9.0 8.6*  MG  --  1.5*  --  2.0  --   --   PHOS  --   --   --  4.4  --   --     GFR: Estimated Creatinine Clearance: 15.2 mL/min (A) (by C-G formula based on SCr of 2.46 mg/dL (H)). Liver Function Tests: Recent Labs  Lab 12/23/20 2200 12/24/20 0044  AST 17 18  ALT 12 13  ALKPHOS 83 83  BILITOT 0.5 0.9  PROT 6.4* 6.4*  ALBUMIN 3.7 3.8    No results for input(s): LIPASE, AMYLASE in the last 168 hours. No results for input(s): AMMONIA in the last 168 hours. Coagulation Profile: No results for input(s): INR, PROTIME in the last 168 hours. Cardiac Enzymes: No results for input(s): CKTOTAL, CKMB, CKMBINDEX, TROPONINI in the last 168 hours. BNP (last 3 results) No results for input(s): PROBNP in the last 8760 hours. HbA1C: No results for input(s): HGBA1C in the last 72 hours. CBG: Recent Labs  Lab 12/25/20 0747 12/25/20 1114 12/25/20 1622 12/25/20 1946 12/26/20 0738  GLUCAP 124* 226* 133* 131* 108*    Lipid Profile: Recent Labs    12/24/20 0235  CHOL 184  HDL 60  LDLCALC 105*  TRIG 96  CHOLHDL 3.1    Thyroid Function Tests: Recent Labs    12/26/20 0550  TSH 3.772   Anemia Panel: Recent Labs    12/26/20 0359  VITAMINB12 471  FOLATE 6.5   Sepsis Labs: Recent Labs  Lab 12/23/20 2200  PROCALCITON <0.10     No results found for this or any previous visit (from the past 240 hour(s)).       Radiology  Studies: ECHOCARDIOGRAM COMPLETE  Result Date: 12/24/2020    ECHOCARDIOGRAM REPORT   Patient Name:   [name] Date of Exam: 12/24/2020 Medical Rec #:  12/26/2020  Height:       64.0 in Accession #:    [id number] Weight:       155.1 lb Date of Birth:  [birth year]  BSA:          1.756 m Patient Age:    89 years   BP:           144/95 mmHg Patient Gender: F          HR:  88 bpm. Exam Location:  Inpatient Procedure: 2D Echo, Cardiac Doppler and Color Doppler Indications:    CHF  History:        Patient has no prior history of Echocardiogram examinations.                 Previous Myocardial Infarction and CAD, Signs/Symptoms:Chest                 Pain; Risk Factors:Diabetes and Hypertension.  Sonographer:    Neomia Dear RDCS Referring Phys: [id number] JUSTIN B HOWERTER IMPRESSIONS  1. Left ventricular ejection fraction, by estimation, is <20%. The left ventricle has severely decreased function. The left ventricle demonstrates global hypokinesis. The left ventricular internal cavity size was moderately dilated. Left ventricular diastolic parameters are indeterminate.  2. Right ventricular systolic function is normal. The right ventricular size is normal. There is moderately elevated pulmonary artery systolic pressure.  3. Left atrial size was moderately dilated.  4. Moderate pleural effusion in the left lateral region.  5. The mitral valve is normal in structure. Mild to moderate mitral valve regurgitation. No evidence of mitral stenosis.  6. The aortic valve is tricuspid. Aortic valve regurgitation is not visualized. Mild aortic valve sclerosis is present, with no evidence of aortic valve stenosis.  7. The inferior vena cava is dilated in size with <50% respiratory variability, suggesting right atrial pressure of 15 mmHg. FINDINGS  Left Ventricle: Left ventricular ejection fraction, by estimation, is <20%. The left ventricle has severely decreased function. The left ventricle demonstrates global hypokinesis. The  left ventricular internal cavity size was moderately dilated. There is no left ventricular hypertrophy. Left ventricular diastolic parameters are indeterminate. Right Ventricle: The right ventricular size is normal.Right ventricular systolic function is normal. There is moderately elevated pulmonary artery systolic pressure. The tricuspid regurgitant velocity is 3.01 m/s, and with an assumed right atrial pressure of 15 mmHg, the estimated right ventricular systolic pressure is 51.2 mmHg. Left Atrium: Left atrial size was moderately dilated. Right Atrium: Right atrial size was normal in size. Pericardium: Trivial pericardial effusion is present. Mitral Valve: The mitral valve is normal in structure. Mild to moderate mitral valve regurgitation. No evidence of mitral valve stenosis. Tricuspid Valve: The tricuspid valve is normal in structure. Tricuspid valve regurgitation is trivial. No evidence of tricuspid stenosis. Aortic Valve: The aortic valve is tricuspid. Aortic valve regurgitation is not visualized. Mild aortic valve sclerosis is present, with no evidence of aortic valve stenosis. Aortic valve mean gradient measures 2.0 mmHg. Aortic valve peak gradient measures 4.3 mmHg. Aortic valve area, by VTI measures 1.79 cm. Pulmonic Valve: The pulmonic valve was normal in structure. Pulmonic valve regurgitation is trivial. No evidence of pulmonic stenosis. Aorta: The aortic root is normal in size and structure. Venous: The inferior vena cava is dilated in size with less than 50% respiratory variability, suggesting right atrial pressure of 15 mmHg.  Additional Comments: There is a moderate pleural effusion in the left lateral region.  LEFT VENTRICLE PLAX 2D LVIDd:         5.90 cm LVIDs:         5.80 cm LV PW:         0.90 cm LV IVS:        0.80 cm LVOT diam:     2.20 cm LV SV:         34 LV SV Index:   19 LVOT Area:     3.80 cm  LV Volumes (MOD) LV vol d, MOD  A2C: 115.0 ml LV vol d, MOD A4C: 105.0 ml LV vol s, MOD A2C:  87.6 ml LV vol s, MOD A4C: 95.3 ml LV SV MOD A2C:     27.4 ml LV SV MOD A4C:     105.0 ml LV SV MOD BP:      18.1 ml RIGHT VENTRICLE RV Basal diam:  2.90 cm RV Mid diam:    2.10 cm RV S prime:     9.90 cm/s TAPSE (M-mode): 1.4 cm LEFT ATRIUM           Index       RIGHT ATRIUM           Index LA diam:      4.40 cm 2.51 cm/m  RA Area:     11.10 cm LA Vol (A2C): 72.8 ml 41.46 ml/m RA Volume:   23.30 ml  13.27 ml/m  AORTIC VALVE                   PULMONIC VALVE AV Area (Vmax):    2.00 cm    PV Vmax:       0.76 m/s AV Area (Vmean):   1.83 cm    PV Vmean:      51.300 cm/s AV Area (VTI):     1.79 cm    PV VTI:        0.118 m AV Vmax:           104.00 cm/s PV Peak grad:  2.3 mmHg AV Vmean:          71.700 cm/s PV Mean grad:  1.0 mmHg AV VTI:            0.187 m AV Peak Grad:      4.3 mmHg AV Mean Grad:      2.0 mmHg LVOT Vmax:         54.70 cm/s LVOT Vmean:        34.500 cm/s LVOT VTI:          0.088 m LVOT/AV VTI ratio: 0.47  AORTA Ao Root diam: 2.90 cm Ao Asc diam:  3.00 cm MITRAL VALVE                 TRICUSPID VALVE MV Area (PHT): 5.23 cm      TR Peak grad:   36.2 mmHg MV Decel Time: 145 msec      TR Vmax:        301.00 cm/s MR Peak grad:    79.6 mmHg MR Mean grad:    54.0 mmHg   SHUNTS MR Vmax:         446.00 cm/s Systemic VTI:  0.09 m MR Vmean:        346.0 cm/s  Systemic Diam: 2.20 cm MR PISA:         1.57 cm MR PISA Eff ROA: 8 mm MR PISA Radius:  0.50 cm MV E velocity: 63.40 cm/s MV A velocity: 67.80 cm/s MV E/A ratio:  0.94 Olga MillersBrian Crenshaw MD Electronically signed by Olga MillersBrian Crenshaw MD Signature Date/Time: 12/24/2020/1:55:31 PM    Final         Scheduled Meds:  (feeding supplement) PROSource Plus  30 mL Oral Daily   aspirin EC  81 mg Oral Daily   atorvastatin  80 mg Oral QHS   furosemide  40 mg Intravenous Daily   insulin aspart  0-6 Units Subcutaneous TID WC   mouth rinse  15 mL Mouth Rinse BID   metoprolol  tartrate  25 mg Oral BID   multivitamin with minerals  1 tablet Oral Daily    Continuous Infusions:          Glade Lloyd, MD [facility] 12/26/2020, 7:51 AM

## 2020-12-26 NOTE — Care Management Important Message (Signed)
Important Message  Patient Details IM Letter given to the Patient. Name: Arien Morine MRN: [id number] Date of Birth: 03/20/1931   Medicare Important Message Given:  Yes     Caren Macadam 12/26/2020, 10:10 AM

## 2020-12-26 NOTE — Progress Notes (Addendum)
Progress Note  Patient Name: [name] Date of Encounter: 12/26/2020  Primary Cardiologist: Donato Schultz, MD  Subjective   Husband at bedside. They both feel she is doing well today. No CP, SOB, edema. Slept well.  Inpatient Medications    Scheduled Meds:  (feeding supplement) PROSource Plus  30 mL Oral Daily   aspirin EC  81 mg Oral Daily   atorvastatin  80 mg Oral QHS   furosemide  40 mg Intravenous Daily   heparin injection (subcutaneous)  5,000 Units Subcutaneous Q8H   insulin aspart  0-6 Units Subcutaneous TID WC   mouth rinse  15 mL Mouth Rinse BID   metoprolol tartrate  25 mg Oral BID   multivitamin with minerals  1 tablet Oral Daily   Continuous Infusions:  PRN Meds: acetaminophen **OR** acetaminophen, ipratropium-albuterol   Vital Signs    Vitals:   12/25/20 1511 12/25/20 1951 12/25/20 2139 12/26/20 0421  BP: (!) 83/52 (!) 92/55 97/61 99/65   Pulse: 73 75 76 75  Resp: 16 20 20 18   Temp: 97.8 F (36.6 C) 98.3 F (36.8 C)  98.2 F (36.8 C)  TempSrc:  Oral    SpO2: 100% 99% 100% 100%  Weight:      Height:        Intake/Output Summary (Last 24 hours) at 12/26/2020 1052 Last data filed at 12/26/2020 0600 Gross per 24 hour  Intake 463.73 ml  Output 400 ml  Net 63.73 ml   Last 3 Weights 12/25/2020 12/24/2020 12/23/2020  Weight (lbs) 154 lb 15.7 oz 155 lb 1.6 oz 156 lb 4.8 oz  Weight (kg) 70.3 kg 70.353 kg 70.897 kg     Telemetry    NSR - Personally Reviewed  Physical Exam   GEN: No acute distress.  HEENT: Normocephalic, atraumatic, sclera non-icteric. Neck: No JVD or bruits. Cardiac: RRR no murmurs, rubs, or gallops.  Respiratory: Decreased BS at bases bilaterally. Breathing is unlabored. GI: Soft, nontender, non-distended, BS +x 4. MS: no deformity. Extremities: No clubbing or cyanosis. No edema. Distal pedal pulses are 2+ and equal bilaterally. Neuro:  Alert, oriented to self, thought it was [facility], 1928s. Follows commands. Psych:   Responds to general symptom questions appropriately with a calm affect.  Labs    High Sensitivity Troponin:   Recent Labs  Lab 12/24/20 0044 12/24/20 0235 12/24/20 0427 12/25/20 0207 12/25/20 0407  TROPONINIHS 451* 546* 557* 423* 420*      Cardiac EnzymesNo results for input(s): TROPONINI in the last 168 hours. No results for input(s): TROPIPOC in the last 168 hours.   Chemistry Recent Labs  Lab 12/23/20 2200 12/24/20 0044 12/25/20 0207 12/26/20 0359  NA 144 145 142 141  K 4.8 4.8 5.4* 4.8  CL 109 107 104 105  CO2 25 27 27 26   GLUCOSE 110* 116* 153* 107*  BUN 33* 33* 37* 46*  CREATININE 1.91* 1.97* 1.89* 2.46*  CALCIUM 8.5* 9.0 9.0 8.6*  PROT 6.4* 6.4*  --   --   ALBUMIN 3.7 3.8  --   --   AST 17 18  --   --   ALT 12 13  --   --   ALKPHOS 83 83  --   --   BILITOT 0.5 0.9  --   --   GFRNONAA 25* 24* 25* 18*  ANIONGAP 10 11 11 10      Hematology Recent Labs  Lab 12/24/20 0044 12/25/20 0207 12/26/20 0359  WBC 7.4 10.5 5.3  RBC 3.07*  3.29* 4.14  HGB 10.4* 11.2* 13.8  HCT 33.6* 35.9* 43.6  MCV 109.4* 109.1* 105.3*  MCH 33.9 34.0 33.3  MCHC 31.0 31.2 31.7  RDW 14.6 14.5 14.8  PLT 116* 112* 77*    BNP Recent Labs  Lab 12/23/20 1648  BNP 106.8*     DDimer  Recent Labs  Lab 12/24/20 0427 12/25/20 0207  DDIMER 1.66* 0.91*     Radiology    ECHOCARDIOGRAM COMPLETE  Result Date: 12/24/2020    ECHOCARDIOGRAM REPORT   Patient Name:   [name] Date of Exam: 12/24/2020 Medical Rec #:  [id number]  Height:       64.0 in Accession #:    [id number] Weight:       155.1 lb Date of Birth:  03/09/1931  BSA:          1.756 m Patient Age:    87 years   BP:           144/95 mmHg Patient Gender: F          HR:           88 bpm. Exam Location:  Inpatient Procedure: 2D Echo, Cardiac Doppler and Color Doppler Indications:    CHF  History:        Patient has no prior history of Echocardiogram examinations.                 Previous Myocardial Infarction and CAD,  Signs/Symptoms:Chest                 Pain; Risk Factors:Diabetes and Hypertension.  Sonographer:    Neomia Dear RDCS Referring Phys: [id number] JUSTIN B HOWERTER IMPRESSIONS  1. Left ventricular ejection fraction, by estimation, is <20%. The left ventricle has severely decreased function. The left ventricle demonstrates global hypokinesis. The left ventricular internal cavity size was moderately dilated. Left ventricular diastolic parameters are indeterminate.  2. Right ventricular systolic function is normal. The right ventricular size is normal. There is moderately elevated pulmonary artery systolic pressure.  3. Left atrial size was moderately dilated.  4. Moderate pleural effusion in the left lateral region.  5. The mitral valve is normal in structure. Mild to moderate mitral valve regurgitation. No evidence of mitral stenosis.  6. The aortic valve is tricuspid. Aortic valve regurgitation is not visualized. Mild aortic valve sclerosis is present, with no evidence of aortic valve stenosis.  7. The inferior vena cava is dilated in size with <50% respiratory variability, suggesting right atrial pressure of 15 mmHg. FINDINGS  Left Ventricle: Left ventricular ejection fraction, by estimation, is <20%. The left ventricle has severely decreased function. The left ventricle demonstrates global hypokinesis. The left ventricular internal cavity size was moderately dilated. There is no left ventricular hypertrophy. Left ventricular diastolic parameters are indeterminate. Right Ventricle: The right ventricular size is normal.Right ventricular systolic function is normal. There is moderately elevated pulmonary artery systolic pressure. The tricuspid regurgitant velocity is 3.01 m/s, and with an assumed right atrial pressure of 15 mmHg, the estimated right ventricular systolic pressure is 51.2 mmHg. Left Atrium: Left atrial size was moderately dilated. Right Atrium: Right atrial size was normal in size. Pericardium: Trivial  pericardial effusion is present. Mitral Valve: The mitral valve is normal in structure. Mild to moderate mitral valve regurgitation. No evidence of mitral valve stenosis. Tricuspid Valve: The tricuspid valve is normal in structure. Tricuspid valve regurgitation is trivial. No evidence of tricuspid stenosis. Aortic Valve: The aortic valve is tricuspid. Aortic valve regurgitation is  not visualized. Mild aortic valve sclerosis is present, with no evidence of aortic valve stenosis. Aortic valve mean gradient measures 2.0 mmHg. Aortic valve peak gradient measures 4.3 mmHg. Aortic valve area, by VTI measures 1.79 cm. Pulmonic Valve: The pulmonic valve was normal in structure. Pulmonic valve regurgitation is trivial. No evidence of pulmonic stenosis. Aorta: The aortic root is normal in size and structure. Venous: The inferior vena cava is dilated in size with less than 50% respiratory variability, suggesting right atrial pressure of 15 mmHg.  Additional Comments: There is a moderate pleural effusion in the left lateral region.  LEFT VENTRICLE PLAX 2D LVIDd:         5.90 cm LVIDs:         5.80 cm LV PW:         0.90 cm LV IVS:        0.80 cm LVOT diam:     2.20 cm LV SV:         34 LV SV Index:   19 LVOT Area:     3.80 cm  LV Volumes (MOD) LV vol d, MOD A2C: 115.0 ml LV vol d, MOD A4C: 105.0 ml LV vol s, MOD A2C: 87.6 ml LV vol s, MOD A4C: 95.3 ml LV SV MOD A2C:     27.4 ml LV SV MOD A4C:     105.0 ml LV SV MOD BP:      18.1 ml RIGHT VENTRICLE RV Basal diam:  2.90 cm RV Mid diam:    2.10 cm RV S prime:     9.90 cm/s TAPSE (M-mode): 1.4 cm LEFT ATRIUM           Index       RIGHT ATRIUM           Index LA diam:      4.40 cm 2.51 cm/m  RA Area:     11.10 cm LA Vol (A2C): 72.8 ml 41.46 ml/m RA Volume:   23.30 ml  13.27 ml/m  AORTIC VALVE                   PULMONIC VALVE AV Area (Vmax):    2.00 cm    PV Vmax:       0.76 m/s AV Area (Vmean):   1.83 cm    PV Vmean:      51.300 cm/s AV Area (VTI):     1.79 cm    PV VTI:         0.118 m AV Vmax:           104.00 cm/s PV Peak grad:  2.3 mmHg AV Vmean:          71.700 cm/s PV Mean grad:  1.0 mmHg AV VTI:            0.187 m AV Peak Grad:      4.3 mmHg AV Mean Grad:      2.0 mmHg LVOT Vmax:         54.70 cm/s LVOT Vmean:        34.500 cm/s LVOT VTI:          0.088 m LVOT/AV VTI ratio: 0.47  AORTA Ao Root diam: 2.90 cm Ao Asc diam:  3.00 cm MITRAL VALVE                 TRICUSPID VALVE MV Area (PHT): 5.23 cm      TR Peak grad:   36.2 mmHg MV Decel Time: 145  msec      TR Vmax:        301.00 cm/s MR Peak grad:    79.6 mmHg MR Mean grad:    54.0 mmHg   SHUNTS MR Vmax:         446.00 cm/s Systemic VTI:  0.09 m MR Vmean:        346.0 cm/s  Systemic Diam: 2.20 cm MR PISA:         1.57 cm MR PISA Eff ROA: 8 mm MR PISA Radius:  0.50 cm MV E velocity: 63.40 cm/s MV A velocity: 67.80 cm/s MV E/A ratio:  0.94 Olga Millers MD Electronically signed by Olga Millers MD Signature Date/Time: 12/24/2020/1:55:31 PM    Final     Cardiac Studies   Echo 12/24/20 IMPRESSIONS     1. Left ventricular ejection fraction, by estimation, is <20%. The left  ventricle has severely decreased function. The left ventricle demonstrates  global hypokinesis. The left ventricular internal cavity size was  moderately dilated. Left ventricular  diastolic parameters are indeterminate.   2. Right ventricular systolic function is normal. The right ventricular  size is normal. There is moderately elevated pulmonary artery systolic  pressure.   3. Left atrial size was moderately dilated.   4. Moderate pleural effusion in the left lateral region.   5. The mitral valve is normal in structure. Mild to moderate mitral valve  regurgitation. No evidence of mitral stenosis.   6. The aortic valve is tricuspid. Aortic valve regurgitation is not  visualized. Mild aortic valve sclerosis is present, with no evidence of  aortic valve stenosis.   7. The inferior vena cava is dilated in size with <50% respiratory   variability, suggesting right atrial pressure of 15 mmHg.   FINDINGS   Left Ventricle: Left ventricular ejection fraction, by estimation, is  <20%. The left ventricle has severely decreased function. The left  ventricle demonstrates global hypokinesis. The left ventricular internal  cavity size was moderately dilated. There  is no left ventricular hypertrophy. Left ventricular diastolic parameters  are indeterminate.   Right Ventricle: The right ventricular size is normal.Right ventricular  systolic function is normal. There is moderately elevated pulmonary artery  systolic pressure. The tricuspid regurgitant velocity is 3.01 m/s, and  with an assumed right atrial  pressure of 15 mmHg, the estimated right ventricular systolic pressure is  51.2 mmHg.   Left Atrium: Left atrial size was moderately dilated.   Right Atrium: Right atrial size was normal in size.   Pericardium: Trivial pericardial effusion is present.   Mitral Valve: The mitral valve is normal in structure. Mild to moderate  mitral valve regurgitation. No evidence of mitral valve stenosis.   Tricuspid Valve: The tricuspid valve is normal in structure. Tricuspid  valve regurgitation is trivial. No evidence of tricuspid stenosis.   Aortic Valve: The aortic valve is tricuspid. Aortic valve regurgitation is  not visualized. Mild aortic valve sclerosis is present, with no evidence  of aortic valve stenosis. Aortic valve mean gradient measures 2.0 mmHg.  Aortic valve peak gradient  measures 4.3 mmHg. Aortic valve area, by VTI measures 1.79 cm.   Pulmonic Valve: The pulmonic valve was normal in structure. Pulmonic valve  regurgitation is trivial. No evidence of pulmonic stenosis.   Aorta: The aortic root is normal in size and structure.   Venous: The inferior vena cava is dilated in size with less than 50%  respiratory variability, suggesting right atrial pressure of 15 mmHg.  Additional Comments: There is a  moderate pleural effusion in the left  Patient Profile     87 y.o. female with CAD with NSTEMI 2010 s/p DES to LCx (nonobstructive residual disease treated medically, DM2, HTN, gout, dementia, CKD stage IIIb, acute hypoxic respiratory failure due to Covid-19 PNA 11/2020. Admitted with worsening SOB, lower extremity edema, and hypoxia. Found to have elevated troponin and severe cardiomyopathy EF <20% with global HK, mild-moderate MR.  Assessment & Plan    1. Elevated troponin/suspected NSTEMI (peak troponin 557) - not clear if primary ACS as this could also be a secondary demand process related to hypoxia and CHF - given advanced dementia, advanced age, renal failure, anemia, thrombocytopenia she is not a good candidate for invasive ischemic assessment due to risks outweighing the benefits - treated medically with 48hr of IV heparin, ASA, statin (LDL 105), BB - may need to hold ASA if platelet count remains low or drops further  2. Acute systolic CHF/cardiomyopathy - not yet weighed today, only down 2lb from last assessment and unclear that I/O's are accurate - hypotension prohibits aggressive GDMT (not on ACEI, ARB, ARNI, spironolactone due to this) - consider consolidating Lopressor to Toprol prior to DC if clinically appropriate - still with decreased BS at bases suggestive of pleural effusions, but creatinine noted to be rising further to 2.46 - received IV Lasix again today, will discuss dosing with MD  3. NSVT - continue BB - no further episodes on telemetry - not felt to be a candidate for ICD  4. AKI superimposed on CKD stage IIIb - previous baseline 1.5-1.8 - admitted at 1.99, worsening to 2.46 today - continue to follow as outlined above  5. Dementia - fairly advanced per notes - remains calm  6. Worsening thrombocytopenia - progressive decline to 77 today - heparin changed to DVT prophylaxis - will review further with MD regarding ASA  Would suggest GOC consult as she is  at high risk of decompensation and rehospitalization given multiple significant medical problems superimposed on diminished baseline functional status. I discussed this with the patient and her husband. The patient defers to her husband. He is not yet sure about proceeding. He plans to discuss with their 2 daughters and will let us know if they would like to pursue this.  Remainder of medical problems per primary team  For questions or updates, please contact [facility] Please consult [email or web address] for contact info under Cardiology/STEMI.  Signed, Laurann Montanaayna N Dunn, PA-C 12/26/2020, 10:52 AM    Personally seen and examined. Agree with above.  With platelet reduction, we will go ahead and stop her heparin subcutaneous injections for DVT prophylaxis.  Add SCDs   Continue with beta-blocker.  Creatinine has increased.  Worsening renal function.  Okay with aspirin at this point.  Agree that a palliative care consult would be helpful just for goals of care.  Obviously she is at high risk for morbidity mortality.  Continue with DNR.  I will go ahead and hold her Lasix now.  She seems to be breathing fairly comfortably.  Another chest x-ray PA and lateral to be sure.  Donato SchultzMark Auden Wettstein, MD

## 2020-12-27 DIAGNOSIS — Z515 Encounter for palliative care: Secondary | ICD-10-CM

## 2020-12-27 DIAGNOSIS — N189 Chronic kidney disease, unspecified: Secondary | ICD-10-CM | POA: Diagnosis not present

## 2020-12-27 DIAGNOSIS — I5033 Acute on chronic diastolic (congestive) heart failure: Secondary | ICD-10-CM | POA: Diagnosis not present

## 2020-12-27 DIAGNOSIS — Z7189 Other specified counseling: Secondary | ICD-10-CM

## 2020-12-27 DIAGNOSIS — I214 Non-ST elevation (NSTEMI) myocardial infarction: Secondary | ICD-10-CM | POA: Diagnosis not present

## 2020-12-27 DIAGNOSIS — N179 Acute kidney failure, unspecified: Secondary | ICD-10-CM | POA: Diagnosis not present

## 2020-12-27 LAB — BASIC METABOLIC PANEL
Anion gap: 8 (ref 5–15)
BUN: 63 mg/dL — ABNORMAL HIGH (ref 8–23)
CO2: 27 mmol/L (ref 22–32)
Calcium: 8.6 mg/dL — ABNORMAL LOW (ref 8.9–10.3)
Chloride: 105 mmol/L (ref 98–111)
Creatinine, Ser: 2.82 mg/dL — ABNORMAL HIGH (ref 0.44–1.00)
GFR, Estimated: 16 mL/min — ABNORMAL LOW (ref >60)
Glucose, Bld: 112 mg/dL — ABNORMAL HIGH (ref 70–99)
Potassium: 5.3 mmol/L — ABNORMAL HIGH (ref 3.5–5.1)
Sodium: 140 mmol/L (ref 135–145)

## 2020-12-27 LAB — CBC
HCT: 34 % — ABNORMAL LOW (ref 36.0–46.0)
Hemoglobin: 10.8 g/dL — ABNORMAL LOW (ref 12.0–15.0)
MCH: 33.4 pg (ref 26.0–34.0)
MCHC: 31.8 g/dL (ref 30.0–36.0)
MCV: 105.3 fL — ABNORMAL HIGH (ref 80.0–100.0)
Platelets: 112 10*3/uL — ABNORMAL LOW (ref 150–400)
RBC: 3.23 MIL/uL — ABNORMAL LOW (ref 3.87–5.11)
RDW: 14.6 % (ref 11.5–15.5)
WBC: 7.9 10*3/uL (ref 4.0–10.5)
nRBC: 0 % (ref 0.0–0.2)

## 2020-12-27 LAB — GLUCOSE, CAPILLARY
Glucose-Capillary: 101 mg/dL — ABNORMAL HIGH (ref 70–99)
Glucose-Capillary: 141 mg/dL — ABNORMAL HIGH (ref 70–99)
Glucose-Capillary: 146 mg/dL — ABNORMAL HIGH (ref 70–99)

## 2020-12-27 LAB — MAGNESIUM: Magnesium: 1.7 mg/dL (ref 1.7–2.4)

## 2020-12-27 NOTE — Plan of Care (Signed)

## 2020-12-27 NOTE — Progress Notes (Signed)
PT Cancellation Note  Patient Details Name: [name] MRN: [id number] DOB: Feb 18, 1931   Cancelled Treatment:    Reason Eval/Treat Not Completed: Other (comment) (Pt requests to rest). Upon entry into room, pt sleeping, awakens easily to therapist's voice. Pt requests she is tired and wants to rest, asks therapist to come back later. Per RN, pt has been up in chair for majority of day, has transferred on/off BSC a few times and just recently got back in bed. Will check back as schedule permits.    Domenick Bookbinder PT, DPT 12/27/20, 4:03 PM

## 2020-12-27 NOTE — Progress Notes (Addendum)
Progress Note  Patient Name: [name] Date of Encounter: 12/27/2020  Primary Cardiologist: Donato Schultz, MD  Subjective   No acute complaints. No pain. No SOB. Spoke with daughter [name] "[name]" on the phone this AM.   Inpatient Medications    Scheduled Meds:  (feeding supplement) PROSource Plus  30 mL Oral Daily   aspirin EC  81 mg Oral Daily   atorvastatin  80 mg Oral QHS   insulin aspart  0-6 Units Subcutaneous TID WC   mouth rinse  15 mL Mouth Rinse BID   metoprolol tartrate  25 mg Oral BID   multivitamin with minerals  1 tablet Oral Daily   Continuous Infusions:  PRN Meds: acetaminophen **OR** acetaminophen, ipratropium-albuterol   Vital Signs    Vitals:   12/26/20 0421 12/26/20 1243 12/26/20 2055 12/27/20 0546  BP: 99/65 106/60 101/68 (!) 101/57  Pulse: 75 66 75 66  Resp: 18 (!) 24 18 18   Temp: 98.2 F (36.8 C) (!) 97.4 F (36.3 C) 98 F (36.7 C) 97.7 F (36.5 C)  TempSrc:   Oral Oral  SpO2: 100% 100% 100% 100%  Weight:      Height:        Intake/Output Summary (Last 24 hours) at 12/27/2020 0815 Last data filed at 12/26/2020 1812 Gross per 24 hour  Intake 0 ml  Output --  Net 0 ml   Last 3 Weights 12/25/2020 12/24/2020 12/23/2020  Weight (lbs) 154 lb 15.7 oz 155 lb 1.6 oz 156 lb 4.8 oz  Weight (kg) 70.3 kg 70.353 kg 70.897 kg     Telemetry    NSR - Personally Reviewed  Physical Exam   GEN: No acute distress.  HEENT: Normocephalic, atraumatic, sclera non-icteric. Neck: No JVD or bruits. Cardiac: RRR no murmurs, rubs, or gallops.  Respiratory: Decreased BS at bases. No wheezes or rhonchi. GI: Soft, nontender, non-distended, BS +x 4. MS: no deformity. Extremities: No clubbing or cyanosis. No edema. Distal pedal pulses are 2+ and equal bilaterally. Neuro:  Knows name and hospital. Does not know date. Follows commands. Psych:  Responds to questions appropriately with a normal affect.  Labs    High Sensitivity Troponin:   Recent Labs  Lab  12/24/20 0044 12/24/20 0235 12/24/20 0427 12/25/20 0207 12/25/20 0407  TROPONINIHS 451* 546* 557* 423* 420*      Cardiac EnzymesNo results for input(s): TROPONINI in the last 168 hours. No results for input(s): TROPIPOC in the last 168 hours.   Chemistry Recent Labs  Lab 12/23/20 2200 12/24/20 0044 12/25/20 0207 12/26/20 0359 12/27/20 0412  NA 144 145 142 141 140  K 4.8 4.8 5.4* 4.8 5.3*  CL 109 107 104 105 105  CO2 25 27 27 26 27   GLUCOSE 110* 116* 153* 107* 112*  BUN 33* 33* 37* 46* 63*  CREATININE 1.91* 1.97* 1.89* 2.46* 2.82*  CALCIUM 8.5* 9.0 9.0 8.6* 8.6*  PROT 6.4* 6.4*  --   --   --   ALBUMIN 3.7 3.8  --   --   --   AST 17 18  --   --   --   ALT 12 13  --   --   --   ALKPHOS 83 83  --   --   --   BILITOT 0.5 0.9  --   --   --   GFRNONAA 25* 24* 25* 18* 16*  ANIONGAP 10 11 11 10 8      Hematology Recent Labs  Lab 12/25/20 0207  12/26/20 0359 12/27/20 0412  WBC 10.5 5.3 7.9  RBC 3.29* 4.14 3.23*  HGB 11.2* 13.8 10.8*  HCT 35.9* 43.6 34.0*  MCV 109.1* 105.3* 105.3*  MCH 34.0 33.3 33.4  MCHC 31.2 31.7 31.8  RDW 14.5 14.8 14.6  PLT 112* 77* 112*    BNP Recent Labs  Lab 12/23/20 1648  BNP 106.8*     DDimer  Recent Labs  Lab 12/24/20 0427 12/25/20 0207  DDIMER 1.66* 0.91*     Radiology    DG Chest 2 View  Result Date: 12/26/2020 CLINICAL DATA:  Follow-up pulmonary edema EXAM: CHEST - 2 VIEW COMPARISON:  12/23/2020 FINDINGS: Cardiac shadow remains enlarged. Aortic calcifications are again seen. Bilateral pleural effusions are noted left greater than right with central vascular congestion and pulmonary edema. The overall edema pattern has improved slightly in the interval from the prior exam. No new focal abnormality is noted. IMPRESSION: Improving pulmonary edema. Bilateral pleural effusions left greater than right. Electronically Signed   By: Alcide CleverMark  Lukens M.D.   On: 12/26/2020 16:53    Cardiac Studies    Echo 12/24/20 IMPRESSIONS     1.  Left ventricular ejection fraction, by estimation, is <20%. The left  ventricle has severely decreased function. The left ventricle demonstrates  global hypokinesis. The left ventricular internal cavity size was  moderately dilated. Left ventricular  diastolic parameters are indeterminate.   2. Right ventricular systolic function is normal. The right ventricular  size is normal. There is moderately elevated pulmonary artery systolic  pressure.   3. Left atrial size was moderately dilated.   4. Moderate pleural effusion in the left lateral region.   5. The mitral valve is normal in structure. Mild to moderate mitral valve  regurgitation. No evidence of mitral stenosis.   6. The aortic valve is tricuspid. Aortic valve regurgitation is not  visualized. Mild aortic valve sclerosis is present, with no evidence of  aortic valve stenosis.   7. The inferior vena cava is dilated in size with <50% respiratory  variability, suggesting right atrial pressure of 15 mmHg.   FINDINGS   Left Ventricle: Left ventricular ejection fraction, by estimation, is  <20%. The left ventricle has severely decreased function. The left  ventricle demonstrates global hypokinesis. The left ventricular internal  cavity size was moderately dilated. There  is no left ventricular hypertrophy. Left ventricular diastolic parameters  are indeterminate.   Right Ventricle: The right ventricular size is normal.Right ventricular  systolic function is normal. There is moderately elevated pulmonary artery  systolic pressure. The tricuspid regurgitant velocity is 3.01 m/s, and  with an assumed right atrial  pressure of 15 mmHg, the estimated right ventricular systolic pressure is  51.2 mmHg.   Left Atrium: Left atrial size was moderately dilated.   Right Atrium: Right atrial size was normal in size.   Pericardium: Trivial pericardial effusion is present.   Mitral Valve: The mitral valve is normal in structure. Mild to  moderate  mitral valve regurgitation. No evidence of mitral valve stenosis.   Tricuspid Valve: The tricuspid valve is normal in structure. Tricuspid  valve regurgitation is trivial. No evidence of tricuspid stenosis.   Aortic Valve: The aortic valve is tricuspid. Aortic valve regurgitation is  not visualized. Mild aortic valve sclerosis is present, with no evidence  of aortic valve stenosis. Aortic valve mean gradient measures 2.0 mmHg.  Aortic valve peak gradient  measures 4.3 mmHg. Aortic valve area, by VTI measures 1.79 cm.   Pulmonic Valve: The  pulmonic valve was normal in structure. Pulmonic valve  regurgitation is trivial. No evidence of pulmonic stenosis.   Aorta: The aortic root is normal in size and structure.   Venous: The inferior vena cava is dilated in size with less than 50%  respiratory variability, suggesting right atrial pressure of 15 mmHg.     Additional Comments: There is a moderate pleural effusion in the left    Patient Profile     87 y.o. female with CAD with NSTEMI 2010 s/p DES to LCx (nonobstructive residual disease treated medically, DM2, HTN, gout, dementia, CKD stage IIIb, acute hypoxic respiratory failure due to Covid-19 PNA 11/2020. Admitted with worsening SOB, lower extremity edema, and hypoxia. Found to have elevated troponin and severe cardiomyopathy EF <20% with global HK, mild-moderate MR.    Assessment & Plan      1. Elevated troponin/suspected NSTEMI (peak troponin 557) - not clear if primary ACS as this could also be a secondary demand process related to hypoxia and CHF - given advanced dementia, advanced age, renal failure, anemia, thrombocytopenia she is not a candidate for invasive ischemic assessment due to risks outweighing the benefits - treated medically with 48hr of IV heparin, ASA, statin (LDL 105), BB - heparin discontinued 6/16 (including planned DVT ppx) due to declining platelet count  2. Acute systolic CHF/cardiomyopathy -  initially treated with IV Lasix - CXR yesterday showed improved pulmonary edema but continued bilateral pleural effusions But she has had worsening AKI - will review with MD, seems poor candidate for advanced therapies including inotropes - hypotension prohibits aggressive GDMT (not on ACEI, ARB, ARNI, spironolactone due to this) - consider consolidating Lopressor to Toprol prior to DC if clinically appropriate  3. NSVT - continue BB - no further episodes on telemetry - not felt to be a candidate for ICD   4. AKI superimposed on CKD stage IIIb with declining UOP - previous baseline 1.5-1.8 - continues to worsen to 2.82 with hyperkalemia and poor urine output - continue to hold nephrotoxic agents - consider nephrology consult although palliative consult also very appropriate - do not think she is a candidate for hemodialysis, will d/w IM   5. Dementia - fairly advanced per notes - remains calm   6. Worsening anemia/thrombocytopenia - platelet declined to 77 yesterday, prompting d/c of heparin, improved to 112 - Hgb has declined from 13.8 to 10.8 - further management per primary team  Palliative care/GOC discussions suggested yesterday. Primary team placed consult yesterday. I called today to give more details. She remains at high risk for symptomatic decompensation and rehospitalization so this remains very appropriate. I spoke with daughter [name] "[name]" Andrey Campanile this AM who states she would be on board for pursuing hospice if that is recommended. I feel this is very appropriate, but husband did not seem to grasp this concept well yesterday. Appreciate palliative care and primary teams assistance.  For questions or updates, please contact [facility] Please consult [email or web address] for contact info under Cardiology/STEMI.  Signed, Laurann Montana, PA-C 12/27/2020, 8:15 AM    Personally seen and examined. Agree with above.  87 year old with severe cardiomyopathy EF less than 20% with non-ST  elevation myocardial infarction nonsustained VT dementia with thrombocytopenia, AKI. -Appreciate further consultation of palliative care team for discussion of goals of care.  Certainly she would be a candidate for hospice which may provide her resources that would be helpful in the home setting. -Worsening creatinine again today 2.8.  Poor output.  Not a candidate for  hemodialysis.  Lasix has been discontinued.  Donato Schultz, MD

## 2020-12-27 NOTE — Progress Notes (Signed)
Patient ID: [name], female   DOB: 1931-03-19, 87 y.o.   MRN: [id number]  PROGRESS NOTE    [name]  PYP:[id number] DOB: 1931/03/08 DOA: 12/23/2020 PCP: Rodrigo Ran, MD   Brief Narrative:  87 year old female with history of CAD with non-STEMI and stent placement, diabetes mellitus type 2, dementia, chronic kidney disease stage IIIb, hypertension, COVID-19 pneumonia last month requiring oxygen upon discharge presented with worsening shortness of breath.  She was admitted for CHF exacerbation with troponin elevation.  Cardiology was consulted.  She was started on heparin drip and IV Lasix.  Assessment & Plan:   Acute systolic heart failure NSTEMI/Elevated troponin probably secondary to above History of CAD/MI with prior stenting -Echo shows EF of less than 20%.  Cardiology following.  Lasix held on 12/26/2020 because of worsening renal function.  Continue aspirin, statin and metoprolol.  Heparin drip has been discontinued by cardiology. -Strict input and output, daily weights, fluid restriction.  Negative fluid balance of 1365.4 cc since admission.  Urine output worsening.  Diabetes mellitus type 2 -Continue CBGs with SSI  Acute kidney injury on chronic kidney disease stage IIIb -Baseline creatinine of 1.5-2.  Worsening: Creatinine 2.82 today.  Monitor.  Urine output worsening.  Hyperkalemia -Mild.  Monitor.  Hypertension -Blood pressure on the lower side.  Continue beta-blocker  Thrombocytopenia -Questionable cause.  Improving.  No signs of bleeding.  Monitor  Chronic macrocytic anemia -Questionable cause.  TSH, folate and vitamin B12 levels normal  Generalized deconditioning -PT recommends SNF versus 24-hour supervision at home.  TOC consult. -Palliative care consultation for goals of care discussion is pending.  With her worsening creatinine and worsening urine output, she probably has a very poor prognosis and might even qualify for hospice.  Chronic hypoxic respiratory  failure History of recent COVID-19 pneumonia -Currently on 3 L oxygen via nasal cannula.  Incentive spirometry.  Dementia -Fall precautions  DVT prophylaxis: SCDs.  Subcutaneous heparin discontinued because of thrombocytopenia  code Status: DNR Family Communication: None at bedside Disposition Plan: Status is: Inpatient  Remains inpatient appropriate because:Inpatient level of care appropriate due to severity of illness  Dispo: The patient is from: Home              Anticipated d/c is to: SNF              Patient currently is not medically stable to d/c.   Difficult to place patient No   Consultants: Cardiology.  Palliative care consultation  Procedures: Echo as below  Antimicrobials: None   Subjective: Patient seen and examined at bedside.  Extremely poor historian.  No overnight agitation, fever or vomiting reported by nursing staff.  Objective: Vitals:   12/26/20 0421 12/26/20 1243 12/26/20 2055 12/27/20 0546  BP: 99/65 106/60 101/68 (!) 101/57  Pulse: 75 66 75 66  Resp: 18 (!) 24 18 18   Temp: 98.2 F (36.8 C) (!) 97.4 F (36.3 C) 98 F (36.7 C) 97.7 F (36.5 C)  TempSrc:   Oral Oral  SpO2: 100% 100% 100% 100%  Weight:      Height:        Intake/Output Summary (Last 24 hours) at 12/27/2020 0740 Last data filed at 12/26/2020 1812 Gross per 24 hour  Intake 0 ml  Output --  Net 0 ml    Filed Weights   12/23/20 2243 12/24/20 0600 12/25/20 0642  Weight: 70.9 kg 70.4 kg 70.3 kg    Examination:  General exam: Elderly female lying in bed.  Currently still  on 3 L oxygen by nasal cannula.  No acute distress.  Extremely poor historian. Respiratory system: Bilateral decreased breath sounds at bases with basilar crackles cardiovascular system: S1-S2 heard, rate controlled Gastrointestinal system: Abdomen is distended slightly, soft and nontender.  Normal bowel sounds heard  extremities: No cyanosis; trace lower extremity edema present Central nervous system:  Extremely poor historian; slightly confused.  No focal neurological deficits.  Moving extremities Skin: No obvious petechiae/lesions psychiatry: Cannot assess because of mental status  Data Reviewed: I have personally reviewed following labs and imaging studies  CBC: Recent Labs  Lab 12/23/20 1648 12/24/20 0044 12/25/20 0207 12/26/20 0359 12/27/20 0412  WBC 6.7 7.4 10.5 5.3 7.9  NEUTROABS 5.3  --   --   --   --   HGB 11.0* 10.4* 11.2* 13.8 10.8*  HCT 34.8* 33.6* 35.9* 43.6 34.0*  MCV 108.4* 109.4* 109.1* 105.3* 105.3*  PLT 122* 116* 112* 77* 112*    Basic Metabolic Panel: Recent Labs  Lab 12/23/20 1848 12/23/20 2200 12/24/20 0044 12/25/20 0207 12/26/20 0359 12/27/20 0412  NA  --  144 145 142 141 140  K  --  4.8 4.8 5.4* 4.8 5.3*  CL  --  109 107 104 105 105  CO2  --  25 27 27 26 27   GLUCOSE  --  110* 116* 153* 107* 112*  BUN  --  33* 33* 37* 46* 63*  CREATININE  --  1.91* 1.97* 1.89* 2.46* 2.82*  CALCIUM  --  8.5* 9.0 9.0 8.6* 8.6*  MG 1.5*  --  2.0  --   --  1.7  PHOS  --   --  4.4  --   --   --     GFR: Estimated Creatinine Clearance: 13.3 mL/min (A) (by C-G formula based on SCr of 2.82 mg/dL (H)). Liver Function Tests: Recent Labs  Lab 12/23/20 2200 12/24/20 0044  AST 17 18  ALT 12 13  ALKPHOS 83 83  BILITOT 0.5 0.9  PROT 6.4* 6.4*  ALBUMIN 3.7 3.8    No results for input(s): LIPASE, AMYLASE in the last 168 hours. No results for input(s): AMMONIA in the last 168 hours. Coagulation Profile: No results for input(s): INR, PROTIME in the last 168 hours. Cardiac Enzymes: No results for input(s): CKTOTAL, CKMB, CKMBINDEX, TROPONINI in the last 168 hours. BNP (last 3 results) No results for input(s): PROBNP in the last 8760 hours. HbA1C: No results for input(s): HGBA1C in the last 72 hours. CBG: Recent Labs  Lab 12/26/20 0738 12/26/20 1203 12/26/20 1609 12/26/20 2031 12/27/20 0720  GLUCAP 108* 121* 195* 131* 101*    Lipid Profile: No results  for input(s): CHOL, HDL, LDLCALC, TRIG, CHOLHDL, LDLDIRECT in the last 72 hours.  Thyroid Function Tests: Recent Labs    12/26/20 0550  TSH 3.772    Anemia Panel: Recent Labs    12/26/20 0359  VITAMINB12 471  FOLATE 6.5    Sepsis Labs: Recent Labs  Lab 12/23/20 2200  PROCALCITON <0.10     No results found for this or any previous visit (from the past 240 hour(s)).       Radiology Studies: DG Chest 2 View  Result Date: 12/26/2020 CLINICAL DATA:  Follow-up pulmonary edema EXAM: CHEST - 2 VIEW COMPARISON:  12/23/2020 FINDINGS: Cardiac shadow remains enlarged. Aortic calcifications are again seen. Bilateral pleural effusions are noted left greater than right with central vascular congestion and pulmonary edema. The overall edema pattern has improved slightly in the interval  from the prior exam. No new focal abnormality is noted. IMPRESSION: Improving pulmonary edema. Bilateral pleural effusions left greater than right. Electronically Signed   By: Alcide Clever M.D.   On: 12/26/2020 16:53        Scheduled Meds:  (feeding supplement) PROSource Plus  30 mL Oral Daily   aspirin EC  81 mg Oral Daily   atorvastatin  80 mg Oral QHS   insulin aspart  0-6 Units Subcutaneous TID WC   mouth rinse  15 mL Mouth Rinse BID   metoprolol tartrate  25 mg Oral BID   multivitamin with minerals  1 tablet Oral Daily   Continuous Infusions:          Glade Lloyd, MD [facility] 12/27/2020, 7:40 AM

## 2020-12-28 DIAGNOSIS — R0602 Shortness of breath: Secondary | ICD-10-CM

## 2020-12-28 DIAGNOSIS — I5033 Acute on chronic diastolic (congestive) heart failure: Secondary | ICD-10-CM | POA: Diagnosis not present

## 2020-12-28 DIAGNOSIS — E875 Hyperkalemia: Secondary | ICD-10-CM

## 2020-12-28 DIAGNOSIS — J9611 Chronic respiratory failure with hypoxia: Secondary | ICD-10-CM | POA: Diagnosis not present

## 2020-12-28 DIAGNOSIS — N179 Acute kidney failure, unspecified: Secondary | ICD-10-CM | POA: Diagnosis not present

## 2020-12-28 DIAGNOSIS — Z7189 Other specified counseling: Secondary | ICD-10-CM | POA: Diagnosis not present

## 2020-12-28 DIAGNOSIS — I214 Non-ST elevation (NSTEMI) myocardial infarction: Secondary | ICD-10-CM | POA: Diagnosis not present

## 2020-12-28 DIAGNOSIS — Z515 Encounter for palliative care: Secondary | ICD-10-CM | POA: Diagnosis not present

## 2020-12-28 LAB — CBC
HCT: 43.8 % (ref 36.0–46.0)
Hemoglobin: 13.8 g/dL (ref 12.0–15.0)
MCH: 33.6 pg (ref 26.0–34.0)
MCHC: 31.5 g/dL (ref 30.0–36.0)
MCV: 106.6 fL — ABNORMAL HIGH (ref 80.0–100.0)
Platelets: 99 10*3/uL — ABNORMAL LOW (ref 150–400)
RBC: 4.11 MIL/uL (ref 3.87–5.11)
RDW: 14.5 % (ref 11.5–15.5)
WBC: 8.1 10*3/uL (ref 4.0–10.5)
nRBC: 0 % (ref 0.0–0.2)

## 2020-12-28 LAB — BASIC METABOLIC PANEL
Anion gap: 10 (ref 5–15)
BUN: 70 mg/dL — ABNORMAL HIGH (ref 8–23)
CO2: 26 mmol/L (ref 22–32)
Calcium: 8.4 mg/dL — ABNORMAL LOW (ref 8.9–10.3)
Chloride: 102 mmol/L (ref 98–111)
Creatinine, Ser: 2.62 mg/dL — ABNORMAL HIGH (ref 0.44–1.00)
GFR, Estimated: 17 mL/min — ABNORMAL LOW (ref >60)
Glucose, Bld: 129 mg/dL — ABNORMAL HIGH (ref 70–99)
Potassium: 6.1 mmol/L — ABNORMAL HIGH (ref 3.5–5.1)
Sodium: 138 mmol/L (ref 135–145)

## 2020-12-28 LAB — GLUCOSE, CAPILLARY
Glucose-Capillary: 121 mg/dL — ABNORMAL HIGH (ref 70–99)
Glucose-Capillary: 131 mg/dL — ABNORMAL HIGH (ref 70–99)
Glucose-Capillary: 190 mg/dL — ABNORMAL HIGH (ref 70–99)

## 2020-12-28 LAB — MAGNESIUM: Magnesium: 1.8 mg/dL (ref 1.7–2.4)

## 2020-12-28 MED ORDER — SODIUM ZIRCONIUM CYCLOSILICATE 10 G PO PACK
10.0000 g | PACK | Freq: Once | ORAL | Status: AC
  Administered 2020-12-28: 10 g via ORAL
  Filled 2020-12-28: qty 1

## 2020-12-28 MED ORDER — NALOXONE HCL 0.4 MG/ML IJ SOLN
INTRAMUSCULAR | Status: AC
  Filled 2020-12-28: qty 1

## 2020-12-28 NOTE — Consult Note (Signed)
Consultation Note Date: 12/28/2020   Patient Name: [name]  DOB: 02/09/1931  MRN: [id number]  Age / Sex: 87 y.o., female  PCP: Rodrigo Ran, MD Referring Physician: Glade Lloyd, MD  Reason for Consultation: Establishing goals of care  HPI/Patient Profile: 87 y.o. female  with past medical history of CAD with NSTEMI and stent placement, diabetes, dementia, chronic kidney disease, hypertension, COVID a month ago admitted on 12/23/2020 with CHF herbs extubation and troponin elevation concerning for NSTEMI.  Her renal function has worsened as well.  Cardiology is following and she was started on heparin drip and Lasix.  Daughter reports that family would be open to consideration for hospice support moving forward with palliative consulted for goals of care.  Clinical Assessment and Goals of Care: I saw and examined Ms. [name] today.  She was sleeping but did arouse.  She reported being very tired immediately fell back to sleep.  No family at the bedside.  I was unable to speak with her husband today, but I was able to discuss with her daughter, [name], via phone.  [name] reports that she herself has been dealing with medical issues and her father has been assisting Ms. [name] at home.  The most important things to her are being with family and at home with her husband.  She spends most of her day sitting in chair or couch and they will occasionally go for drives together.  [name] reports she is very much has seen her mother slowing down over the past couple of years.  We discussed clinical course as well as wishes moving forward in regard to advanced directives.  Concepts specific to code status and rehospitalization discussed.  We discussed difference between a aggressive medical intervention path and a palliative, comfort focused care path.  Values and goals of care important to patient and family were attempted to be  elicited.   Concept of Hospice and Palliative Care were discussed.  [name] reports that they as a family have been discussing potential for hospice and are open to further conversation about this moving forward.  We discussed today about different levels of hospice care including home and residential hospice.  I told her that I thought that her mother would be a good candidate for home hospice and, if her renal function continues to worsen, she may even be eligible for residential hospice moving forward.  She was thankful for information provided and will try and speak with her father about this as well.  She feels that her father would better be able to understand conversation in person and I told her I would try to meet up with him tomorrow to discuss.   Questions and concerns addressed.   PMT will continue to support holistically.  SUMMARY OF RECOMMENDATIONS   -DNR/DNI -I spoke at length with patient's daughter, [name], today.  [name] reports that family has been discussing potential for hospice support.  I was unable to discuss with her husband today but he does come the visit daily and I will try  to meet with him tomorrow in person. [name] and I discussed plan to continue with current interventions and reassess overall status including renal and cardiac function tomorrow.  I agree she would benefit from home hospice if patient and husband are agreeable and renal function stabilizes.  If she progresses in renal failure, we may be looking at a shorter timeframe with consideration for residential hospice.  Will discuss further with family tomorrow.  Code Status/Advance Care Planning: DNR  Palliative Prophylaxis:  Aspiration, Delirium Protocol, and Frequent Pain Assessment  Additional Recommendations (Limitations, Scope, Preferences): Avoid Hospitalization  Psycho-social/Spiritual:  Desire for further Chaplaincy support: Not addressed today Additional Recommendations: Education on  Hospice  Prognosis:  Likely < 6 months if her disease follows natural course and I believe she should be considered for home hospice if in line with goals  Discharge Planning: To Be Determined- ? Home hospice       Primary Diagnoses: Present on Admission:  Acute on chronic diastolic (congestive) heart failure (HCC)  SOB (shortness of breath)  Elevated troponin  Acute kidney injury superimposed on CKD (HCC)  Hyperkalemia  HYPERTENSION, BENIGN  Hyperlipidemia   I have reviewed the medical record, interviewed the patient and family, and examined the patient. The following aspects are pertinent.  Past Medical History:  Diagnosis Date   Chest pain    Coronary atherosclerosis of native coronary artery    NSTEMI 11/10, LCx stent   Diabetes mellitus    type II   Gout    Hypertension    Low back pain    chronic   Obesity    Social History   Socioeconomic History   Marital status: Married    Spouse name: Not on file   Number of children: Not on file   Years of education: Not on file   Highest education level: Not on file  Occupational History   Occupation: retired    Comment: quality control lab with [employer]  Tobacco Use   Smoking status: Never   Smokeless tobacco: Never  Substance and Sexual Activity   Alcohol use: No   Drug use: No   Sexual activity: Not on file  Other Topics Concern   Not on file  Social History Narrative   Not on file   Social Determinants of Health   Financial Resource Strain: Not on file  Food Insecurity: Not on file  Transportation Needs: Not on file  Physical Activity: Not on file  Stress: Not on file  Social Connections: Not on file   Family History  Problem Relation Age of Onset   Stroke Mother 83       deceased   Heart attack Father 61       deceased   Other Other        no CAD in siblings (3 brothers and 1 sister)   Scheduled Meds:  (feeding supplement) PROSource Plus  30 mL Oral Daily   aspirin EC  81 mg Oral Daily    atorvastatin  80 mg Oral QHS   insulin aspart  0-6 Units Subcutaneous TID WC   mouth rinse  15 mL Mouth Rinse BID   metoprolol tartrate  25 mg Oral BID   multivitamin with minerals  1 tablet Oral Daily   Continuous Infusions: PRN Meds:.acetaminophen **OR** acetaminophen, ipratropium-albuterol Medications Prior to Admission:  Prior to Admission medications   Medication Sig Start Date End Date Taking? Authorizing Provider  acetaminophen (TYLENOL) 325 MG tablet Take 650 mg by mouth every 6 (six)  hours as needed for mild pain, fever or headache.   Yes [provider]  aspirin 81 MG tablet Take 81 mg by mouth daily.   Yes [provider]  atorvastatin (LIPITOR) 80 MG tablet Take 80 mg by mouth at bedtime.   Yes [provider]  febuxostat (ULORIC) 40 MG tablet Take 40 mg by mouth daily.   Yes [provider]  ferrous sulfate 325 (65 FE) MG tablet Take 325 mg by mouth at bedtime.   Yes [provider]  furosemide (LASIX) 40 MG tablet Take 40 mg by mouth daily. 07/30/19  Yes [provider]  losartan (COZAAR) 100 MG tablet Take 100 mg by mouth daily.  02/26/15  Yes [provider]  pantoprazole (PROTONIX) 40 MG tablet Take 40 mg by mouth daily. 07/08/19  Yes [provider]  polyvinyl alcohol (LIQUIFILM TEARS) 1.4 % ophthalmic solution Place 1 drop into both eyes as needed for dry eyes.   Yes [provider]  rivastigmine (EXELON) 4.6 mg/24hr Place 4.6 mg onto the skin daily.   Yes [provider]  vitamin B-12 (CYANOCOBALAMIN) 500 MCG tablet Take 500 mcg by mouth daily.   Yes [provider]  ACCU-CHEK AVIVA PLUS test strip  01/11/15   [provider]  Lancets (ACCU-CHEK MULTICLIX) lancets Use as directed to check your blood sugar 01/11/15   [provider]   Allergies  Allergen Reactions   Allopurinol Hives   Sulfonamide Derivatives Hives   Review of Systems  Physical  Exam General: Sleepy but arouses, reports being tired and goes back to sleep, in no acute distress.   HEENT: No bruits, no goiter, no JVD Heart: Regular rate and rhythm. No murmur appreciated. Lungs: Decreased air movement, clear Abdomen: Soft, nontender, nondistended, positive bowel sounds.   Ext: No significant edema Skin: Warm and dry  Vital Signs: BP 114/69 (BP Location: Left Arm)   Pulse 69   Temp 97.6 F (36.4 C) (Oral)   Resp 20   Ht 5\' 4"  (1.626 m)   Wt 70.3 kg   SpO2 100%   BMI 26.60 kg/m  Pain Scale: 0-10   Pain Score: 0-No pain   SpO2: SpO2: 100 % O2 Device:SpO2: 100 % O2 Flow Rate: .O2 Flow Rate (L/min): 3 L/min  IO: Intake/output summary:  Intake/Output Summary (Last 24 hours) at 12/28/2020 0859 Last data filed at 12/27/2020 1904 Gross per 24 hour  Intake 330 ml  Output --  Net 330 ml    LBM: Last BM Date:  (PTA) Baseline Weight: Weight: 70.9 kg Most recent weight: Weight: 70.3 kg     Palliative Assessment/Data:   Flowsheet Rows    Flowsheet Row Most Recent Value  Intake Tab   Referral Department Hospitalist  Unit at Time of Referral Med/Surg Unit  Palliative Care Primary Diagnosis Cardiac  Date Notified 12/26/20  Palliative Care Type New Palliative care  Reason for referral Clarify Goals of Care  Date of Admission 12/23/20  Date first seen by Palliative Care 12/27/20  # of days Palliative referral response time 1 Day(s)  # of days IP prior to Palliative referral 3  Clinical Assessment   Palliative Performance Scale Score 40%  Psychosocial & Spiritual Assessment   Palliative Care Outcomes   Patient/Family meeting held? Yes  Who was at the meeting? Daughter via phone  Palliative Care Outcomes Clarified goals of care       Time In: 1400  Time Out: 1520 Time Total: 70 Greater  than 50%  of this time was spent counseling and coordinating care related to the above assessment and plan.  Signed by: Romie Minus, MD   Please contact  Palliative Medicine Team phone at [phone] for questions and concerns.  For individual provider: See Loretha Stapler

## 2020-12-28 NOTE — Progress Notes (Signed)
   Creatinine a little lower today, but off of diuretics- Potassium continues to climb- had some NSVT yesterday- LVEF is ~20%. Appreciate palliative care evaluation - she is appropriate for hospice - I agree that options for treatment are limited. Would consider Lokelma 10g today for hyperkalemia per hospitalist. Cardiology will follow peripherally over the weekend.  Chrystie Nose, MD, [facility], FACP  [place]  [facility]  Medical Director of the Advanced Lipid Disorders &  Cardiovascular Risk Reduction Clinic Diplomate of the American Board of Clinical Lipidology Attending Cardiologist  Direct Dial: [phone]  Fax: [phone]  Website:  [email or web address]

## 2020-12-28 NOTE — TOC Progression Note (Addendum)
Transition of Care [facility]) - Progression Note    Patient Details  Name: [name] MRN: [id number] Date of Birth: 02-Jul-1931  Transition of Care [facility]) CM/SW Contact  Geni Bers, RN Phone Number: 12/28/2020, 2:30 PM  Clinical Narrative:     Palliative to consult for disposition to Hospice or SNF.   Expected Discharge Plan: Skilled Nursing Facility (or Hospice) Barriers to Discharge: No Barriers Identified  Expected Discharge Plan and Services Expected Discharge Plan: Skilled Nursing Facility (or Hospice)       Living arrangements for the past 2 months: Single Family Home                                       Social Determinants of Health (SDOH) Interventions    Readmission Risk Interventions No flowsheet data found.

## 2020-12-28 NOTE — Progress Notes (Signed)
Palliative care brief note  I saw Ms. Schuhmacher today.  She was more awake and sitting in chair.    No family at bedside, but RN reports that husband was here but has gone home for the day.  He usually comes to visit in AM (Arrives somewhere around 8AM and stays for a couple of hours).  His daughter has told me that he will do much better with in person visit to discuss goals and recommendation for hospice as he has issues understanding people over the phone at times.  In either case, I was unable to reach him by phone today when I tried to call to set up an appointment for tomorrow.  His RN has offered to call me tomorrow when her husband arrives.  Plan for f/u with husband as soon as this is possible, hopefully tomorrow morning.  Romie Minus, MD [facility] Palliative Medicine Team [phone]  NO CHARGE NOTE

## 2020-12-28 NOTE — Progress Notes (Signed)
Patient ID: [name], female   DOB: 01/14/1931, 87 y.o.   MRN: [id number]  PROGRESS NOTE    [name]  WVP:[id number] DOB: May 10, 1931 DOA: 12/23/2020 PCP: Rodrigo Ran, MD   Brief Narrative:  87 year old female with history of CAD with non-STEMI and stent placement, diabetes mellitus type 2, dementia, chronic kidney disease stage IIIb, hypertension, COVID-19 pneumonia last month requiring oxygen upon discharge presented with worsening shortness of breath.  She was admitted for CHF exacerbation with troponin elevation.  Cardiology was consulted.  She was started on heparin drip and IV Lasix.  Subsequently, heparin drip was discontinued and IV Lasix held because of worsening kidney function.  Cardiology is now recommending palliative care consultation for goals of care discussion.  Assessment & Plan:   Acute systolic heart failure NSTEMI/Elevated troponin probably secondary to above History of CAD/MI with prior stenting -Echo shows EF of less than 20%.  Cardiology following.  Lasix held on 12/26/2020 because of worsening renal function.  Continue aspirin, statin and metoprolol.  Heparin drip has been discontinued by cardiology. -Strict input and output, daily weights, fluid restriction.  Negative fluid balance of 1035.4 cc since admission.  Urine output worsening.  Diabetes mellitus type 2 -Continue CBGs with SSI  Acute kidney injury on chronic kidney disease stage IIIb -Baseline creatinine of 1.5-2.  Creatinine slightly improved to 2.62 today.  Monitor.  Urine output worsening.  Hyperkalemia -Potassium 6.1 this morning.  Give 1 dose of Lokelma 10 gm today.  Repeat labs in AM.  Hypertension -Blood pressure on the lower side.  Continue beta-blocker  Thrombocytopenia -Questionable cause. No signs of bleeding.  Monitor  Chronic macrocytic anemia -Questionable cause.  TSH, folate and vitamin B12 levels normal  Generalized deconditioning -PT recommends SNF versus 24-hour supervision at  home.  TOC consult. -Palliative care consultation for goals of care discussion is pending.  With her worsening creatinine and worsening urine output, she probably has a very poor prognosis and might even qualify for hospice.  Cardiology is also recommending hospice. -Spoke to husband at bedside today about consideration of hospice.  Unsure how much he was able to understand.  Awaiting palliative care consultation.  Chronic hypoxic respiratory failure History of recent COVID-19 pneumonia -Currently on 3 L oxygen via nasal cannula.  Incentive spirometry.  Dementia -Fall precautions  DVT prophylaxis: SCDs.  Subcutaneous heparin discontinued because of thrombocytopenia  code Status: DNR Family Communication: Spoke to husband at bedside on 12/27/2020 Disposition Plan: Status is: Inpatient  Remains inpatient appropriate because:Inpatient level of care appropriate due to severity of illness  Dispo: The patient is from: Home              Anticipated d/c is to: SNF              Patient currently is not medically stable to d/c.   Difficult to place patient No   Consultants: Cardiology.  Palliative care consultation  Procedures: Echo as below  Antimicrobials: None   Subjective: Patient seen and examined at bedside.  Extremely poor historian.  No fever, vomiting or agitation reported by nursing staff. Objective: Vitals:   12/27/20 1258 12/27/20 2059 12/27/20 2201 12/28/20 0637  BP: 110/62  103/65 114/69  Pulse: 67  70 69  Resp: (!) 24 20 18 20   Temp: (!) 97.1 F (36.2 C) 98 F (36.7 C) 97.8 F (36.6 C) 97.6 F (36.4 C)  TempSrc: Oral  Oral Oral  SpO2: 100%  100% 100%  Weight:  Height:        Intake/Output Summary (Last 24 hours) at 12/28/2020 0812 Last data filed at 12/27/2020 1904 Gross per 24 hour  Intake 330 ml  Output --  Net 330 ml    Filed Weights   12/23/20 2243 12/24/20 0600 12/25/20 0642  Weight: 70.9 kg 70.4 kg 70.3 kg    Examination:  General exam:  Elderly female lying in bed.  No distress.  Still on 3 L oxygen by nasal cannula.  Extremely poor historian. Respiratory system: Decreased bilateral breath sounds at bases with bibasilar crackles  cardiovascular system: Rate controlled, S1-S2 heard gastrointestinal system: Abdomen is mildly distended, soft and nontender.  Bowel sounds are heard  extremities: Mild lower extremity edema present; no clubbing  Central nervous system: Extremely poor historian; still confused slightly.  No focal neurological deficits.  Moves extremities  skin: No obvious ecchymosis/rashes psychiatry: Hardly participates in any conversation.  Flat affect.  Data Reviewed: I have personally reviewed following labs and imaging studies  CBC: Recent Labs  Lab 12/23/20 1648 12/24/20 0044 12/25/20 0207 12/26/20 0359 12/27/20 0412 12/28/20 0452  WBC 6.7 7.4 10.5 5.3 7.9 8.1  NEUTROABS 5.3  --   --   --   --   --   HGB 11.0* 10.4* 11.2* 13.8 10.8* 13.8  HCT 34.8* 33.6* 35.9* 43.6 34.0* 43.8  MCV 108.4* 109.4* 109.1* 105.3* 105.3* 106.6*  PLT 122* 116* 112* 77* 112* 99*    Basic Metabolic Panel: Recent Labs  Lab 12/23/20 1848 12/23/20 2200 12/24/20 0044 12/25/20 0207 12/26/20 0359 12/27/20 0412 12/28/20 0452  NA  --    < > 145 142 141 140 138  K  --    < > 4.8 5.4* 4.8 5.3* 6.1*  CL  --    < > 107 104 105 105 102  CO2  --    < > 27 27 26 27 26   GLUCOSE  --    < > 116* 153* 107* 112* 129*  BUN  --    < > 33* 37* 46* 63* 70*  CREATININE  --    < > 1.97* 1.89* 2.46* 2.82* 2.62*  CALCIUM  --    < > 9.0 9.0 8.6* 8.6* 8.4*  MG 1.5*  --  2.0  --   --  1.7 1.8  PHOS  --   --  4.4  --   --   --   --    < > = values in this interval not displayed.    GFR: Estimated Creatinine Clearance: 14.3 mL/min (A) (by C-G formula based on SCr of 2.62 mg/dL (H)). Liver Function Tests: Recent Labs  Lab 12/23/20 2200 12/24/20 0044  AST 17 18  ALT 12 13  ALKPHOS 83 83  BILITOT 0.5 0.9  PROT 6.4* 6.4*  ALBUMIN 3.7  3.8    No results for input(s): LIPASE, AMYLASE in the last 168 hours. No results for input(s): AMMONIA in the last 168 hours. Coagulation Profile: No results for input(s): INR, PROTIME in the last 168 hours. Cardiac Enzymes: No results for input(s): CKTOTAL, CKMB, CKMBINDEX, TROPONINI in the last 168 hours. BNP (last 3 results) No results for input(s): PROBNP in the last 8760 hours. HbA1C: No results for input(s): HGBA1C in the last 72 hours. CBG: Recent Labs  Lab 12/26/20 2031 12/27/20 0720 12/27/20 1106 12/27/20 1646 12/28/20 0721  GLUCAP 131* 101* 141* 146* 121*    Lipid Profile: No results for input(s): CHOL, HDL, LDLCALC, TRIG, CHOLHDL,  LDLDIRECT in the last 72 hours.  Thyroid Function Tests: Recent Labs    12/26/20 0550  TSH 3.772    Anemia Panel: Recent Labs    12/26/20 0359  VITAMINB12 471  FOLATE 6.5    Sepsis Labs: Recent Labs  Lab 12/23/20 2200  PROCALCITON <0.10     No results found for this or any previous visit (from the past 240 hour(s)).       Radiology Studies: DG Chest 2 View  Result Date: 12/26/2020 CLINICAL DATA:  Follow-up pulmonary edema EXAM: CHEST - 2 VIEW COMPARISON:  12/23/2020 FINDINGS: Cardiac shadow remains enlarged. Aortic calcifications are again seen. Bilateral pleural effusions are noted left greater than right with central vascular congestion and pulmonary edema. The overall edema pattern has improved slightly in the interval from the prior exam. No new focal abnormality is noted. IMPRESSION: Improving pulmonary edema. Bilateral pleural effusions left greater than right. Electronically Signed   By: Alcide Clever M.D.   On: 12/26/2020 16:53        Scheduled Meds:  (feeding supplement) PROSource Plus  30 mL Oral Daily   aspirin EC  81 mg Oral Daily   atorvastatin  80 mg Oral QHS   insulin aspart  0-6 Units Subcutaneous TID WC   mouth rinse  15 mL Mouth Rinse BID   metoprolol tartrate  25 mg Oral BID   multivitamin  with minerals  1 tablet Oral Daily   Continuous Infusions:          Glade Lloyd, MD [facility] 12/28/2020, 8:12 AM

## 2020-12-28 NOTE — Progress Notes (Signed)
Physical Therapy Treatment Patient Details Name: [name] MRN: [id number] DOB: 1930-07-19 Today's Date: 12/28/2020    History of Present Illness Page [name] is an 87 y.o. female who presents with c/o SOB. She was admitted for CHF exacerbation with troponin elevation. Pt recently hospitalized at [facility] from 12/03/20 - 12/05/20 for COVID-19 infection. PMH: NSTEMI 2010 with stent placement, HTN, LBP, diabetes, CHF, dementia, CKD    PT Comments    General Comments: AxO x 3 knew she was in the hospital but appears very weak currently on 3 lts nasal cannula. Pt required increased assist this session.  General bed mobility comments: required increased assist to transition to EOB.  Very weak. General transfer comment: required increased assist + 2 Max to transfer from elevated bed to [facility] then again from [facility] to recliner.  Very weak.  Limited stance time. Positioned in recliner to comfort and set up lunch tray. Pt able to self feed in upright position.    Follow Up Recommendations  SNF;Home health PT;Other (comment) (Hospice)     Equipment Recommendations  None recommended by PT    Recommendations for Other Services       Precautions / Restrictions Precautions Precautions: Fall    Mobility  Bed Mobility Overal bed mobility: Needs Assistance Bed Mobility: Supine to Sit     Supine to sit: Max assist;+2 for physical assistance;+2 for safety/equipment     General bed mobility comments: required increased assist to transition to EOB.  Very weak.    Transfers Overall transfer level: Needs assistance Equipment used: Rolling walker (2 wheeled) Transfers: Sit to/from [employer] Sit to Stand: Max assist;+2 physical assistance;+2 safety/equipment Stand pivot transfers: Max assist;+2 physical assistance;+2 safety/equipment       General transfer comment: required increased assist + 2 Max to transfer from elevated bed to [facility] then again from [facility] to recliner.  Very weak.   Limited stance time.  Ambulation/Gait             General Gait Details: transfers only this session due to weakness, fatigue and only a brief ability to support her self in standing.   Stairs             Wheelchair Mobility    Modified Rankin (Stroke Patients Only)       Balance                                            Cognition Arousal/Alertness: Awake/alert Behavior During Therapy: WFL for tasks assessed/performed Overall Cognitive Status: History of cognitive impairments - at baseline                                 General Comments: AxO x 3 knew she was in the hospital but appears very weak currently on 3 lts nasal cannula.      Exercises      General Comments        Pertinent Vitals/Pain Pain Assessment: No/denies pain    Home Living                      Prior Function            PT Goals (current goals can now be found in the care plan section) Progress towards PT goals: Progressing toward goals  Frequency    Min 3X/week      PT Plan Discharge plan needs to be updated;Equipment recommendations need to be updated    Co-evaluation              AM-PAC PT "6 Clicks" Mobility   Outcome Measure  Help needed turning from your back to your side while in a flat bed without using bedrails?: A Lot Help needed moving from lying on your back to sitting on the side of a flat bed without using bedrails?: A Lot Help needed moving to and from a bed to a chair (including a wheelchair)?: A Lot Help needed standing up from a chair using your arms (e.g., wheelchair or bedside chair)?: A Lot Help needed to walk in hospital room?: A Lot Help needed climbing 3-5 steps with a railing? : A Lot 6 Click Score: 12    End of Session Equipment Utilized During Treatment: Gait belt Activity Tolerance: Patient limited by fatigue Patient left: in chair;with call bell/phone within reach;with chair alarm  set Nurse Communication: Mobility status PT Visit Diagnosis: Unsteadiness on feet (R26.81);Other abnormalities of gait and mobility (R26.89);Muscle weakness (generalized) (M62.81)     Time: 7124-5809 PT Time Calculation (min) (ACUTE ONLY): 18 min  Charges:  $Therapeutic Activity: 8-22 mins                     {Monta Police  PTA Acute  Rehabilitation Services Pager      [phone] Office      [phone]

## 2020-12-29 DIAGNOSIS — I214 Non-ST elevation (NSTEMI) myocardial infarction: Secondary | ICD-10-CM | POA: Diagnosis not present

## 2020-12-29 DIAGNOSIS — N189 Chronic kidney disease, unspecified: Secondary | ICD-10-CM | POA: Diagnosis not present

## 2020-12-29 DIAGNOSIS — N179 Acute kidney failure, unspecified: Secondary | ICD-10-CM | POA: Diagnosis not present

## 2020-12-29 DIAGNOSIS — I509 Heart failure, unspecified: Secondary | ICD-10-CM

## 2020-12-29 DIAGNOSIS — I5033 Acute on chronic diastolic (congestive) heart failure: Secondary | ICD-10-CM | POA: Diagnosis not present

## 2020-12-29 LAB — BASIC METABOLIC PANEL
Anion gap: 7 (ref 5–15)
BUN: 76 mg/dL — ABNORMAL HIGH (ref 8–23)
CO2: 29 mmol/L (ref 22–32)
Calcium: 8.7 mg/dL — ABNORMAL LOW (ref 8.9–10.3)
Chloride: 104 mmol/L (ref 98–111)
Creatinine, Ser: 2.37 mg/dL — ABNORMAL HIGH (ref 0.44–1.00)
GFR, Estimated: 19 mL/min — ABNORMAL LOW (ref >60)
Glucose, Bld: 114 mg/dL — ABNORMAL HIGH (ref 70–99)
Potassium: 4.7 mmol/L (ref 3.5–5.1)
Sodium: 140 mmol/L (ref 135–145)

## 2020-12-29 LAB — CBC
HCT: 35.3 % — ABNORMAL LOW (ref 36.0–46.0)
Hemoglobin: 11 g/dL — ABNORMAL LOW (ref 12.0–15.0)
MCH: 33.4 pg (ref 26.0–34.0)
MCHC: 31.2 g/dL (ref 30.0–36.0)
MCV: 107.3 fL — ABNORMAL HIGH (ref 80.0–100.0)
Platelets: 123 10*3/uL — ABNORMAL LOW (ref 150–400)
RBC: 3.29 MIL/uL — ABNORMAL LOW (ref 3.87–5.11)
RDW: 14.1 % (ref 11.5–15.5)
WBC: 6.4 10*3/uL (ref 4.0–10.5)
nRBC: 0.3 % — ABNORMAL HIGH (ref 0.0–0.2)

## 2020-12-29 LAB — GLUCOSE, CAPILLARY
Glucose-Capillary: 101 mg/dL — ABNORMAL HIGH (ref 70–99)
Glucose-Capillary: 164 mg/dL — ABNORMAL HIGH (ref 70–99)
Glucose-Capillary: 190 mg/dL — ABNORMAL HIGH (ref 70–99)
Glucose-Capillary: 129 mg/dL — ABNORMAL HIGH (ref 70–99)

## 2020-12-29 NOTE — Progress Notes (Signed)
Patient ID: [name], female   DOB: 1930-09-06, 87 y.o.   MRN: [id number]  PROGRESS NOTE    [name]  RAQ:[id number] DOB: [birth year] DOA: 12/23/2020 PCP: Rodrigo Ran, MD   Brief Narrative:  87 year old female with history of CAD with non-STEMI and stent placement, diabetes mellitus type 2, dementia, chronic kidney disease stage IIIb, hypertension, COVID-19 pneumonia last month requiring oxygen upon discharge presented with worsening shortness of breath.  She was admitted for CHF exacerbation with troponin elevation.  Cardiology was consulted.  She was started on heparin drip and IV Lasix.  Subsequently, heparin drip was discontinued and IV Lasix held because of worsening kidney function.  Cardiology is now recommending palliative care consultation for goals of care discussion.  Assessment & Plan:   Acute systolic heart failure NSTEMI/Elevated troponin probably secondary to above History of CAD/MI with prior stenting -Echo shows EF of less than 20%. Lasix held on 12/26/2020 because of worsening renal function.  Continue aspirin, statin and metoprolol.  Heparin drip has been discontinued by cardiology. -Strict input and output, daily weights, fluid restriction.  Negative fluid balance of 1235.4 cc since admission.  Urine output worsening. -Cardiology is recommending palliative care and hospice    Diabetes mellitus type 2 -Continue CBGs with SSI  Acute kidney injury on chronic kidney disease stage IIIb -Baseline creatinine of 1.5-2.  Creatinine slightly improved to 2.37 today.  Monitor.  Urine output worsening.  Hyperkalemia -Resolved.  Repeat labs in AM.  Hypertension -Blood pressure on the lower side.  Continue beta-blocker  Thrombocytopenia -Questionable cause. No signs of bleeding.  Monitor  Chronic macrocytic anemia -Questionable cause.  TSH, folate and vitamin B12 levels normal  Generalized deconditioning -PT recommends SNF versus 24-hour supervision at home.  TOC  consult. -Palliative care consultation for goals of care discussion is pending.  With her worsening creatinine and worsening urine output, she probably has a very poor prognosis and might even qualify for hospice.  Cardiology is also recommending hospice. -Palliative care following  Chronic hypoxic respiratory failure History of recent COVID-19 pneumonia -Currently on 3 L oxygen via nasal cannula.  Incentive spirometry.  Dementia -Fall precautions  DVT prophylaxis: SCDs.  Subcutaneous heparin discontinued because of thrombocytopenia  code Status: DNR Family Communication: Spoke to husband at bedside on 12/27/2020 Disposition Plan: Status is: Inpatient  Remains inpatient appropriate because:Inpatient level of care appropriate due to severity of illness  Dispo: The patient is from: Home              Anticipated d/c is to: SNF versus hospice              Patient currently is not medically stable to d/c.   Difficult to place patient No   Consultants: Cardiology.  Palliative care consultation  Procedures: Echo as below  Antimicrobials: None   Subjective: Patient seen and examined at bedside.  Very poor historian.  No overnight agitation, vomiting, fever reported by nursing staff  objective: Vitals:   12/28/20 0637 12/28/20 1351 12/28/20 2129 12/29/20 0600  BP: 114/69 117/78 119/60 106/65  Pulse: 69 73 78 71  Resp: 20 16 18 20   Temp: 97.6 F (36.4 C) 97.9 F (36.6 C) 98.1 F (36.7 C) 97.6 F (36.4 C)  TempSrc: Oral     SpO2: 100% 100% 100% 99%  Weight:      Height:        Intake/Output Summary (Last 24 hours) at 12/29/2020 0830 Last data filed at 12/29/2020 0300 Gross per 24 hour  Intake --  Output 200 ml  Net -200 ml    Filed Weights   12/23/20 2243 12/24/20 0600 12/25/20 0642  Weight: 70.9 kg 70.4 kg 70.3 kg    Examination:  General exam: Elderly female lying in bed.  Currently on 2 L oxygen via nasal cannula.  No acute distress.  Extremely poor  historian. Respiratory system: Bilateral decreased breath sounds at bases with basilar crackles  cardiovascular system: S1-S2 heard, rate controlled  gastrointestinal system: Abdomen is distended slightly, soft and nontender.  Normal bowel sounds heard extremities: No cyanosis; bilateral lower extremity edema present Central nervous system: Extremely poor historian; confused; very slow to respond.  No focal neurological deficits.  Moving extremities skin: No obvious petechiae/lesions psychiatry: Affect is extremely flat.  Data Reviewed: I have personally reviewed following labs and imaging studies  CBC: Recent Labs  Lab 12/23/20 1648 12/24/20 0044 12/25/20 0207 12/26/20 0359 12/27/20 0412 12/28/20 0452 12/29/20 0455  WBC 6.7   < > 10.5 5.3 7.9 8.1 6.4  NEUTROABS 5.3  --   --   --   --   --   --   HGB 11.0*   < > 11.2* 13.8 10.8* 13.8 11.0*  HCT 34.8*   < > 35.9* 43.6 34.0* 43.8 35.3*  MCV 108.4*   < > 109.1* 105.3* 105.3* 106.6* 107.3*  PLT 122*   < > 112* 77* 112* 99* 123*   < > = values in this interval not displayed.    Basic Metabolic Panel: Recent Labs  Lab 12/23/20 1848 12/23/20 2200 12/24/20 0044 12/25/20 0207 12/26/20 0359 12/27/20 0412 12/28/20 0452 12/29/20 0455  NA  --    < > 145 142 141 140 138 140  K  --    < > 4.8 5.4* 4.8 5.3* 6.1* 4.7  CL  --    < > 107 104 105 105 102 104  CO2  --    < > 27 27 26 27 26 29   GLUCOSE  --    < > 116* 153* 107* 112* 129* 114*  BUN  --    < > 33* 37* 46* 63* 70* 76*  CREATININE  --    < > 1.97* 1.89* 2.46* 2.82* 2.62* 2.37*  CALCIUM  --    < > 9.0 9.0 8.6* 8.6* 8.4* 8.7*  MG 1.5*  --  2.0  --   --  1.7 1.8  --   PHOS  --   --  4.4  --   --   --   --   --    < > = values in this interval not displayed.    GFR: Estimated Creatinine Clearance: 15.8 mL/min (A) (by C-G formula based on SCr of 2.37 mg/dL (H)). Liver Function Tests: Recent Labs  Lab 12/23/20 2200 12/24/20 0044  AST 17 18  ALT 12 13  ALKPHOS 83 83   BILITOT 0.5 0.9  PROT 6.4* 6.4*  ALBUMIN 3.7 3.8    No results for input(s): LIPASE, AMYLASE in the last 168 hours. No results for input(s): AMMONIA in the last 168 hours. Coagulation Profile: No results for input(s): INR, PROTIME in the last 168 hours. Cardiac Enzymes: No results for input(s): CKTOTAL, CKMB, CKMBINDEX, TROPONINI in the last 168 hours. BNP (last 3 results) No results for input(s): PROBNP in the last 8760 hours. HbA1C: No results for input(s): HGBA1C in the last 72 hours. CBG: Recent Labs  Lab 12/27/20 1646 12/28/20 0721 12/28/20 1140 12/28/20 1635 12/29/20  0736  GLUCAP 146* 121* 131* 190* 101*    Lipid Profile: No results for input(s): CHOL, HDL, LDLCALC, TRIG, CHOLHDL, LDLDIRECT in the last 72 hours.  Thyroid Function Tests: No results for input(s): TSH, T4TOTAL, FREET4, T3FREE, THYROIDAB in the last 72 hours.  Anemia Panel: No results for input(s): VITAMINB12, FOLATE, FERRITIN, TIBC, IRON, RETICCTPCT in the last 72 hours.  Sepsis Labs: Recent Labs  Lab 12/23/20 2200  PROCALCITON <0.10     No results found for this or any previous visit (from the past 240 hour(s)).       Radiology Studies: No results found.      Scheduled Meds:  (feeding supplement) PROSource Plus  30 mL Oral Daily   aspirin EC  81 mg Oral Daily   atorvastatin  80 mg Oral QHS   insulin aspart  0-6 Units Subcutaneous TID WC   mouth rinse  15 mL Mouth Rinse BID   metoprolol tartrate  25 mg Oral BID   multivitamin with minerals  1 tablet Oral Daily   naloxone       Continuous Infusions:          Glade Lloyd, MD [facility] 12/29/2020, 8:30 AM

## 2020-12-29 NOTE — Progress Notes (Signed)
Daily Progress Note   Patient Name: [name]       Date: 12/29/2020 DOB: 10/21/1930  Age: 87 y.o. MRN#: [id number] Attending Physician: Aline August, MD Primary Care Physician: Crist Infante, MD Admit Date: 12/23/2020  Reason for Consultation/Follow-up: Establishing goals of care  Subjective: I was notified by patient's RN Ms. Leavens husband had returned.  I met today with [name] and his wife.  [name] tells me that family is the most important thing to his wife.  He discusses their life together and the hard work they have put into trying to raise a good family.  He tells me that people think they are inseparable and he is very much wondering what things will be like when she is no longer with him.  He becomes appropriately tearful during discussion.  We discussed difference between a aggressive medical intervention path and a palliative, comfort focused care path.  Values and goals of care important to patient and family were attempted to be elicited.  We discussed clinical course as well as wishes moving forward in regard to care plan this hospitalization.  We discussed multiple comorbidities including heart failure and renal impairment.  Discussed that these are not going to be problems that are "fixed" and there is high concern that she may continue to worsen quickly.  He tells me that his daughter has been speaking with him about potential for residential hospice if she continues to worsen.  He wishes this were not the case, but they are open to this possibility.    His daughter then arrived and we talked about a plan to continue with current interventions through the weekend to see how she responds clinically (particularly her kidneys) to see if we get a better combination of likely  prognosis.  If she continues to worsen quickly, we discussed residential hospice.  If she does stabilize, she still is critically ill with multiple comorbidities and recommendation would be for transition home with hospice support.  Questions and concerns addressed.   PMT will continue to support holistically.  Length of Stay: 6  Current Medications: Scheduled Meds:   (feeding supplement) PROSource Plus  30 mL Oral Daily   aspirin EC  81 mg Oral Daily   atorvastatin  80 mg Oral QHS   insulin aspart  0-6 Units Subcutaneous  TID WC   mouth rinse  15 mL Mouth Rinse BID   metoprolol tartrate  25 mg Oral BID   multivitamin with minerals  1 tablet Oral Daily   naloxone        Continuous Infusions:   PRN Meds: acetaminophen **OR** acetaminophen, ipratropium-albuterol  Physical Exam      General: Elderly, chronically ill-appearing, in no acute distress.   HEENT: No bruits, no goiter, no JVD Heart: Regular rate and rhythm. No murmur appreciated. Lungs: Decreased air movement, scattered crackles at bases Abdomen: Soft, nontender, nondistended, positive bowel sounds.   Ext: No significant edema Skin: Warm and dry     Vital Signs: BP 106/65 (BP Location: Left Arm)   Pulse 71   Temp 97.6 F (36.4 C)   Resp 20   Ht _0  (1.626 m)   Wt 70.3 kg   SpO2 99%   BMI 26.60 kg/m  SpO2: SpO2: 99 % O2 Device: O2 Device: Nasal Cannula O2 Flow Rate: O2 Flow Rate (L/min): 3 L/min  Intake/output summary:  Intake/Output Summary (Last 24 hours) at 12/29/2020 0092 Last data filed at 12/29/2020 0300 Gross per 24 hour  Intake --  Output 200 ml  Net -200 ml   LBM: Last BM Date:  (PTA) Baseline Weight: Weight: 70.9 kg Most recent weight: Weight: 70.3 kg       Palliative Assessment/Data:    Flowsheet Rows    Flowsheet Row Most Recent Value  Intake Tab   Referral Department Hospitalist  Unit at Time of Referral Med/Surg Unit  Palliative Care Primary Diagnosis Cardiac  Date Notified  12/26/20  Palliative Care Type New Palliative care  Reason for referral Clarify Goals of Care  Date of Admission 12/23/20  Date first seen by Palliative Care 12/27/20  # of days Palliative referral response time 1 Day(s)  # of days IP prior to Palliative referral 3  Clinical Assessment   Palliative Performance Scale Score 40%  Psychosocial & Spiritual Assessment   Palliative Care Outcomes   Patient/Family meeting held? Yes  Who was at the meeting? Daughter via phone  Palliative Care Outcomes Clarified goals of care       Patient Active Problem List   Diagnosis Date Noted   Acute congestive heart failure ([place])    Acute on chronic diastolic (congestive) heart failure ([facility]) 12/23/2020   SOB (shortness of breath) 12/23/2020   Elevated troponin 12/23/2020   Acute kidney injury superimposed on CKD ([place]) 12/23/2020   Hyperkalemia 12/23/2020   COVID-19 virus infection 12/04/2020   Pneumonia due to COVID-19 virus 12/04/2020   Dementia without behavioral disturbance ([place])    Pneumonia 12/03/2020   Chronic kidney disease, stage 3b ([place]) 12/03/2020   Chronic diastolic CHF (congestive heart failure) ([place]) 12/03/2020   Enteritis due to Clostridium difficile 08/18/2014   Hypomagnesemia 08/18/2014   Diarrhea 08/17/2014   Dehydration 08/17/2014   Hyperlipidemia 04/07/2010   CARDIOMYOPATHY, ISCHEMIC 09/27/2009   DM2 (diabetes mellitus, type 2) ([place]) 06/26/2009   GOUT, UNSPECIFIED 06/26/2009   Obesity 06/26/2009   LOW BACK PAIN, CHRONIC 06/26/2009   HYPERTENSION, BENIGN 06/04/2009   CORONARY ATHEROSCLEROSIS NATIVE CORONARY ARTERY 06/04/2009   CHEST PAIN, PRECORDIAL 06/04/2009    Palliative Care Assessment & Plan   Patient Profile: 87 y.o. female  with past medical history of CAD with NSTEMI and stent placement, diabetes, dementia, chronic kidney disease, hypertension, COVID a month ago admitted on 12/23/2020 with CHF herbs extubation and troponin elevation concerning for NSTEMI.  Her  renal  function has worsened as well.  Cardiology is following and she was started on heparin drip and Lasix.  Daughter reports that family would be open to consideration for hospice support moving forward with palliative consulted for goals of care.  Recommendations/Plan: DNR/DNI Continue current care without escalation Discussed care plan and recommendations for consideration for hospice with family.  They are open to this, but need to work to determine if she would be more appropriate for residential hospice versus home hospice.  This will largely depend upon her clinical course of the next couple of days. Plan for follow-up meeting on Monday at 1:30 PM to discuss plan for either home with hospice versus pursuing residential hospice  Code Status:    Code Status Orders  (From admission, onward)           Start     Ordered   12/23/20 2031  Do not attempt resuscitation (DNR)  Continuous       Question Answer Comment  In the event of cardiac or respiratory ARREST Do not call a "code blue"   In the event of cardiac or respiratory ARREST Do not perform Intubation, CPR, defibrillation or ACLS   In the event of cardiac or respiratory ARREST Use medication by any route, position, wound care, and other measures to relive pain and suffering. May use oxygen, suction and manual treatment of airway obstruction as needed for comfort.      12/23/20 2032           Code Status History     Date Active Date Inactive Code Status Order ID Comments User Context   12/23/2020 1912 12/23/2020 2032 DNR [id number]  Rhetta Mura, DO ED   12/23/2020 1857 12/23/2020 1912 Full Code [id number]  Howerter, Ethelda Chick, DO ED   12/04/2020 0045 12/05/2020 2011 Full Code [id number]  Vianne Bulls, MD ED   08/17/2014 1714 08/21/2014 1827 Full Code [id number]  Thurnell Lose, MD ED       Prognosis:  Unable to determine as it would depend upon her response to current clinical interventions over the next couple of  days.  Discharge Planning: To Be Determined-either home hospice or residential facility  Care plan was discussed with husband, daughter, Dr. Starla Link  Thank you for allowing the Palliative Medicine Team to assist in the care of this patient.   Time In: 1705 Time Out: 1745 Total Time 45 Prolonged Time Billed No      Greater than 50%  of this time was spent counseling and coordinating care related to the above assessment and plan.  Micheline Rough, MD  Please contact Palliative Medicine Team phone at [phone] for questions and concerns.

## 2020-12-29 NOTE — TOC Progression Note (Signed)
Transition of Care [facility]) - Progression Note    Patient Details  Name: [name] MRN: [id number] Date of Birth: 1931/01/27  Transition of [facility]) CM/SW Contact  Chealsey Miyamoto, Olegario Messier, RN Phone Number: 12/29/2020, 2:48 PM  Clinical Narrative:  Noted per palliative care team-to f/u in am with patient/family either home w/hospice vs residential hospice.     Expected Discharge Plan: Skilled Nursing Facility (or Hospice) Barriers to Discharge: No Barriers Identified  Expected Discharge Plan and Services Expected Discharge Plan: Skilled Nursing Facility (or Hospice)       Living arrangements for the past 2 months: Single Family Home                                       Social Determinants of Health (SDOH) Interventions    Readmission Risk Interventions No flowsheet data found.

## 2020-12-30 DIAGNOSIS — Z7189 Other specified counseling: Secondary | ICD-10-CM | POA: Diagnosis not present

## 2020-12-30 DIAGNOSIS — I214 Non-ST elevation (NSTEMI) myocardial infarction: Secondary | ICD-10-CM | POA: Diagnosis not present

## 2020-12-30 DIAGNOSIS — Z515 Encounter for palliative care: Secondary | ICD-10-CM | POA: Diagnosis not present

## 2020-12-30 DIAGNOSIS — N189 Chronic kidney disease, unspecified: Secondary | ICD-10-CM | POA: Diagnosis not present

## 2020-12-30 DIAGNOSIS — N179 Acute kidney failure, unspecified: Secondary | ICD-10-CM | POA: Diagnosis not present

## 2020-12-30 DIAGNOSIS — I5033 Acute on chronic diastolic (congestive) heart failure: Secondary | ICD-10-CM | POA: Diagnosis not present

## 2020-12-30 LAB — GLUCOSE, CAPILLARY
Glucose-Capillary: 98 mg/dL (ref 70–99)
Glucose-Capillary: 130 mg/dL — ABNORMAL HIGH (ref 70–99)
Glucose-Capillary: 147 mg/dL — ABNORMAL HIGH (ref 70–99)
Glucose-Capillary: 125 mg/dL — ABNORMAL HIGH (ref 70–99)

## 2020-12-30 LAB — CBC
HCT: 33.5 % — ABNORMAL LOW (ref 36.0–46.0)
Hemoglobin: 10.9 g/dL — ABNORMAL LOW (ref 12.0–15.0)
MCH: 33.3 pg (ref 26.0–34.0)
MCHC: 32.5 g/dL (ref 30.0–36.0)
MCV: 102.4 fL — ABNORMAL HIGH (ref 80.0–100.0)
Platelets: 118 10*3/uL — ABNORMAL LOW (ref 150–400)
RBC: 3.27 MIL/uL — ABNORMAL LOW (ref 3.87–5.11)
RDW: 14 % (ref 11.5–15.5)
WBC: 6.2 10*3/uL (ref 4.0–10.5)
nRBC: 0 % (ref 0.0–0.2)

## 2020-12-30 LAB — BASIC METABOLIC PANEL
Anion gap: 8 (ref 5–15)
BUN: 75 mg/dL — ABNORMAL HIGH (ref 8–23)
CO2: 27 mmol/L (ref 22–32)
Calcium: 8.6 mg/dL — ABNORMAL LOW (ref 8.9–10.3)
Chloride: 105 mmol/L (ref 98–111)
Creatinine, Ser: 2.2 mg/dL — ABNORMAL HIGH (ref 0.44–1.00)
GFR, Estimated: 21 mL/min — ABNORMAL LOW (ref >60)
Glucose, Bld: 106 mg/dL — ABNORMAL HIGH (ref 70–99)
Potassium: 5.5 mmol/L — ABNORMAL HIGH (ref 3.5–5.1)
Sodium: 140 mmol/L (ref 135–145)

## 2020-12-30 LAB — MAGNESIUM: Magnesium: 2 mg/dL (ref 1.7–2.4)

## 2020-12-30 NOTE — Progress Notes (Signed)
Daily Progress Note   Patient Name: [name]       Date: 12/30/2020 DOB: May 22, 1931  Age: 87 y.o. MRN#: [id number] Attending Physician: Aline August, MD Primary Care Physician: Crist Infante, MD Admit Date: 12/23/2020  Reason for Consultation/Follow-up: Establishing goals of care  Subjective: I met today with Ms. Uber, her husband, her 2 daughters, and her granddaughter.  Ms. Sasaki was awake and alert for portions of conversation, but also slept through portions of conversation as well.  I reviewed clinical course with her daughter who is not present at meeting on 6/18.  We discussed the difference tween an aggressive medical intervention path and palliative, comfort focused care path.  Family reports that they understand that this is not a "fixable" condition and are interested in figuring out the best way to add as much quality time to Ms. Trego's life is possible.  While they would like for her to be at home, there are concerns about them being able to meet her care needs.  We reviewed options for care moving forward including: 1) home with hospice (concern about them being able to meet her care needs as her husband is elderly and has medical limitations and 1 daughter currently has broken foot and is currently wheelchair-bound while also on oxygen) 2) long-term care facility with hospice support (family understands this would likely incur room and board fee which is not feasible for them) 3) residential hospice (I shared with him that I do not think she is currently a candidate as her fluid balance from CHF and her creatinine are improving with continued medical intervention.  We did discuss that there is a real possibility she may be eligible at some point in the next few weeks) 4) trial  of rehab (family understands she is frail and weak and is not realistically likely to be able to do this)  We again talked about most important things to them in her care moving forward and family thinks that further conversation with hospice about home hospice support and level of support that can be provided would be beneficial.  Questions and concerns addressed.   PMT will continue to support holistically.  Length of Stay: 7  Current Medications: Scheduled Meds:   (feeding supplement) PROSource Plus  30 mL Oral Daily   aspirin EC  81 mg  Oral Daily   atorvastatin  80 mg Oral QHS   insulin aspart  0-6 Units Subcutaneous TID WC   mouth rinse  15 mL Mouth Rinse BID   metoprolol tartrate  25 mg Oral BID   multivitamin with minerals  1 tablet Oral Daily    Continuous Infusions:   PRN Meds: acetaminophen **OR** acetaminophen, ipratropium-albuterol  Physical Exam      General: Elderly, chronically ill-appearing, in no acute distress.   HEENT: No bruits, no goiter, no JVD Heart: Regular rate and rhythm. No murmur appreciated. Lungs: Decreased air movement, scattered crackles at bases Abdomen: Soft, nontender, nondistended, positive bowel sounds.   Ext: No significant edema Skin: Warm and dry     Vital Signs: BP 128/68 (BP Location: Right Arm)   Pulse 75   Temp 97.9 F (36.6 C) (Oral)   Resp (!) 25   Ht 5' 4"  (1.626 m)   Wt 70.4 kg   SpO2 98%   BMI 26.64 kg/m  SpO2: SpO2: 98 % O2 Device: O2 Device: Nasal Cannula O2 Flow Rate: O2 Flow Rate (L/min): 3 L/min  Intake/output summary:  Intake/Output Summary (Last 24 hours) at 12/30/2020 2112 Last data filed at 12/30/2020 1410 Gross per 24 hour  Intake 240 ml  Output 571 ml  Net -331 ml    LBM: Last BM Date:  (PTA) Baseline Weight: Weight: 70.9 kg Most recent weight: Weight: 70.4 kg       Palliative Assessment/Data:    Flowsheet Rows    Flowsheet Row Most Recent Value  Intake Tab   Referral Department Hospitalist   Unit at Time of Referral Med/Surg Unit  Palliative Care Primary Diagnosis Cardiac  Date Notified 12/26/20  Palliative Care Type New Palliative care  Reason for referral Clarify Goals of Care  Date of Admission 12/23/20  Date first seen by Palliative Care 12/27/20  # of days Palliative referral response time 1 Day(s)  # of days IP prior to Palliative referral 3  Clinical Assessment   Palliative Performance Scale Score 40%  Psychosocial & Spiritual Assessment   Palliative Care Outcomes   Patient/Family meeting held? Yes  Who was at the meeting? Daughter via phone  Palliative Care Outcomes Clarified goals of care       Patient Active Problem List   Diagnosis Date Noted   Acute congestive heart failure ([place])    Acute on chronic diastolic (congestive) heart failure ([facility]) 12/23/2020   SOB (shortness of breath) 12/23/2020   Elevated troponin 12/23/2020   Acute kidney injury superimposed on CKD ([place]) 12/23/2020   Hyperkalemia 12/23/2020   COVID-19 virus infection 12/04/2020   Pneumonia due to COVID-19 virus 12/04/2020   Dementia without behavioral disturbance ([place])    Pneumonia 12/03/2020   Chronic kidney disease, stage 3b ([place]) 12/03/2020   Chronic diastolic CHF (congestive heart failure) ([place]) 12/03/2020   Enteritis due to Clostridium difficile 08/18/2014   Hypomagnesemia 08/18/2014   Diarrhea 08/17/2014   Dehydration 08/17/2014   Hyperlipidemia 04/07/2010   CARDIOMYOPATHY, ISCHEMIC 09/27/2009   DM2 (diabetes mellitus, type 2) ([place]) 06/26/2009   GOUT, UNSPECIFIED 06/26/2009   Obesity 06/26/2009   LOW BACK PAIN, CHRONIC 06/26/2009   HYPERTENSION, BENIGN 06/04/2009   CORONARY ATHEROSCLEROSIS NATIVE CORONARY ARTERY 06/04/2009   CHEST PAIN, PRECORDIAL 06/04/2009    Palliative Care Assessment & Plan   Patient Profile: 87 y.o. female  with past medical history of CAD with NSTEMI and stent placement, diabetes, dementia, chronic kidney disease, hypertension, COVID a month ago  admitted  on 12/23/2020 with CHF herbs extubation and troponin elevation concerning for NSTEMI.  Her renal function has worsened as well.  Cardiology is following and she was started on heparin drip and Lasix.  Daughter reports that family would be open to consideration for hospice support moving forward with palliative consulted for goals of care.  Recommendations/Plan: DNR/DNI Continue current interventions without escalation Family is considering options for Ms. Montour at time of discharge.  They would like to meet with [facility] collective to discuss potential for home with hospice. Referral placed to [facility] today.  Code Status:    Code Status Orders  (From admission, onward)           Start     Ordered   12/23/20 2031  Do not attempt resuscitation (DNR)  Continuous       Question Answer Comment  In the event of cardiac or respiratory ARREST Do not call a "code blue"   In the event of cardiac or respiratory ARREST Do not perform Intubation, CPR, defibrillation or ACLS   In the event of cardiac or respiratory ARREST Use medication by any route, position, wound care, and other measures to relive pain and suffering. May use oxygen, suction and manual treatment of airway obstruction as needed for comfort.      12/23/20 2032           Code Status History     Date Active Date Inactive Code Status Order ID Comments User Context   12/23/2020 1912 12/23/2020 2032 DNR [id number]  Rhetta Mura, DO ED   12/23/2020 1857 12/23/2020 1912 Full Code [id number]  Howerter, Ethelda Chick, DO ED   12/04/2020 0045 12/05/2020 2011 Full Code [id number]  Vianne Bulls, MD ED   08/17/2014 1714 08/21/2014 1827 Full Code [id number]  Thurnell Lose, MD ED       Prognosis: Likely weeks to a limited number of months.  I do think her likely prognosis if her disease follows its natural course is less than 6 months and she should qualify for hospice services at time of discharge.  Discharge Planning: To Be  Determined-family requested to meet with liaison from [facility] to discuss home hospice  Care plan was discussed with husband, daughters, granddaughter, Dr. Starla Link  Thank you for allowing the Palliative Medicine Team to assist in the care of this patient.   Time In: 1335 Time Out: 1425 Total Time 50 Prolonged Time Billed No   Greater than 50%  of this time was spent counseling and coordinating care related to the above assessment and plan.  Micheline Rough, MD  Please contact Palliative Medicine Team phone at [phone] for questions and concerns.

## 2020-12-30 NOTE — Progress Notes (Signed)
Patient ID: [name], female   DOB: Nov 08, 1930, 87 y.o.   MRN: [id number]  PROGRESS NOTE    [name]  OEU:[id number] DOB: 1930/12/11 DOA: 12/23/2020 PCP: Rodrigo Ran, MD   Brief Narrative:  87 year old female with history of CAD with non-STEMI and stent placement, diabetes mellitus type 2, dementia, chronic kidney disease stage IIIb, hypertension, COVID-19 pneumonia last month requiring oxygen upon discharge presented with worsening shortness of breath.  She was admitted for CHF exacerbation with troponin elevation.  Cardiology was consulted.  She was started on heparin drip and IV Lasix.  Subsequently, heparin drip was discontinued and IV Lasix held because of worsening kidney function.  Cardiology is now recommending palliative care consultation for goals of care discussion.  Assessment & Plan:   Acute systolic heart failure NSTEMI/Elevated troponin probably secondary to above History of CAD/MI with prior stenting -Echo shows EF of less than 20%. Lasix held on 12/26/2020 because of worsening renal function.  Continue aspirin, statin and metoprolol.  Heparin drip has been discontinued by cardiology. -Strict input and output, daily weights, fluid restriction.  Negative fluid balance of 1605.4 cc since admission.  Urine output worsening. -Cardiology is recommending palliative care and hospice    Diabetes mellitus type 2 -Continue CBGs with SSI  Acute kidney injury on chronic kidney disease stage IIIb -Baseline creatinine of 1.5-2.  Creatinine slightly improved to 2.20 today.  Monitor.    Hyperkalemia -Potassium 5.5 this morning.  Will give 1 dose of Lokelma.  Repeat a.m. labs.  Hypertension -Blood pressure on the lower side.  Continue beta-blocker  Thrombocytopenia -Questionable cause. No signs of bleeding.  Monitor  Chronic macrocytic anemia -Questionable cause.  TSH, folate and vitamin B12 levels normal  Generalized deconditioning -PT recommends SNF versus 24-hour supervision  at home.  TOC consult. -With her worsening creatinine and worsening urine output, she probably has a very poor prognosis and might even qualify for hospice.  Cardiology is also recommending hospice. -Palliative care following  Chronic hypoxic respiratory failure History of recent COVID-19 pneumonia -Currently on 3 L oxygen via nasal cannula.  Incentive spirometry.  Dementia -Fall precautions  DVT prophylaxis: SCDs.  Subcutaneous heparin discontinued because of thrombocytopenia  code Status: DNR Family Communication: Spoke to husband at bedside on 12/27/2020 Disposition Plan: Status is: Inpatient  Remains inpatient appropriate because:Inpatient level of care appropriate due to severity of illness  Dispo: The patient is from: Home              Anticipated d/c is to: SNF versus hospice              Patient currently is not medically stable to d/c.   Difficult to place patient No   Consultants: Cardiology.  Palliative care consultation  Procedures: Echo as below  Antimicrobials: None   Subjective: Patient seen and examined at bedside.  Very poor historian.  No overnight fever, vomiting, agitation reported.   Objective: Vitals:   12/29/20 0600 12/29/20 1159 12/29/20 2032 12/30/20 0543  BP: 106/65 111/75 128/79 114/65  Pulse: 71 78 78 68  Resp: 20 (!) 25  20  Temp: 97.6 F (36.4 C) (!) 97.5 F (36.4 C) 98 F (36.7 C) 97.8 F (36.6 C)  TempSrc:  Oral Oral Oral  SpO2: 99% 100% 100% 95%  Weight:    70.4 kg  Height:        Intake/Output Summary (Last 24 hours) at 12/30/2020 0752 Last data filed at 12/30/2020 0600 Gross per 24 hour  Intake 480 ml  Output 850 ml  Net -370 ml    Filed Weights   12/24/20 0600 12/25/20 0642 12/30/20 0543  Weight: 70.4 kg 70.3 kg 70.4 kg    Examination:  General exam: Elderly female lying in bed.  No distress.  On 3 L oxygen via nasal cannula.  Extremely poor historian.  Sleepy, wakes up slightly, does not participate in conversation  much. Respiratory system: Decreased breath sounds at bases bilaterally with scattered crackles, no wheezing  cardiovascular system: Rate controlled, S1-S2 heard gastrointestinal system: Abdomen is mildly distended, soft and nontender.  Bowel sounds are heard  extremities: Trace lower extremity edema present; no clubbing   Data Reviewed: I have personally reviewed following labs and imaging studies  CBC: Recent Labs  Lab 12/23/20 1648 12/24/20 0044 12/26/20 0359 12/27/20 0412 12/28/20 0452 12/29/20 0455 12/30/20 0501  WBC 6.7   < > 5.3 7.9 8.1 6.4 6.2  NEUTROABS 5.3  --   --   --   --   --   --   HGB 11.0*   < > 13.8 10.8* 13.8 11.0* 10.9*  HCT 34.8*   < > 43.6 34.0* 43.8 35.3* 33.5*  MCV 108.4*   < > 105.3* 105.3* 106.6* 107.3* 102.4*  PLT 122*   < > 77* 112* 99* 123* 118*   < > = values in this interval not displayed.    Basic Metabolic Panel: Recent Labs  Lab 12/23/20 1848 12/23/20 2200 12/24/20 0044 12/25/20 0207 12/26/20 0359 12/27/20 0412 12/28/20 0452 12/29/20 0455 12/30/20 0501  NA  --    < > 145   < > 141 140 138 140 140  K  --    < > 4.8   < > 4.8 5.3* 6.1* 4.7 5.5*  CL  --    < > 107   < > 105 105 102 104 105  CO2  --    < > 27   < > 26 27 26 29 27   GLUCOSE  --    < > 116*   < > 107* 112* 129* 114* 106*  BUN  --    < > 33*   < > 46* 63* 70* 76* 75*  CREATININE  --    < > 1.97*   < > 2.46* 2.82* 2.62* 2.37* 2.20*  CALCIUM  --    < > 9.0   < > 8.6* 8.6* 8.4* 8.7* 8.6*  MG 1.5*  --  2.0  --   --  1.7 1.8  --  2.0  PHOS  --   --  4.4  --   --   --   --   --   --    < > = values in this interval not displayed.    GFR: Estimated Creatinine Clearance: 17 mL/min (A) (by C-G formula based on SCr of 2.2 mg/dL (H)). Liver Function Tests: Recent Labs  Lab 12/23/20 2200 12/24/20 0044  AST 17 18  ALT 12 13  ALKPHOS 83 83  BILITOT 0.5 0.9  PROT 6.4* 6.4*  ALBUMIN 3.7 3.8    No results for input(s): LIPASE, AMYLASE in the last 168 hours. No results for  input(s): AMMONIA in the last 168 hours. Coagulation Profile: No results for input(s): INR, PROTIME in the last 168 hours. Cardiac Enzymes: No results for input(s): CKTOTAL, CKMB, CKMBINDEX, TROPONINI in the last 168 hours. BNP (last 3 results) No results for input(s): PROBNP in the last 8760 hours. HbA1C: No results for input(s): HGBA1C  in the last 72 hours. CBG: Recent Labs  Lab 12/29/20 0736 12/29/20 1157 12/29/20 1642 12/29/20 2051 12/30/20 0736  GLUCAP 101* 164* 190* 129* 98    Lipid Profile: No results for input(s): CHOL, HDL, LDLCALC, TRIG, CHOLHDL, LDLDIRECT in the last 72 hours.  Thyroid Function Tests: No results for input(s): TSH, T4TOTAL, FREET4, T3FREE, THYROIDAB in the last 72 hours.  Anemia Panel: No results for input(s): VITAMINB12, FOLATE, FERRITIN, TIBC, IRON, RETICCTPCT in the last 72 hours.  Sepsis Labs: Recent Labs  Lab 12/23/20 2200  PROCALCITON <0.10     No results found for this or any previous visit (from the past 240 hour(s)).       Radiology Studies: No results found.      Scheduled Meds:  (feeding supplement) PROSource Plus  30 mL Oral Daily   aspirin EC  81 mg Oral Daily   atorvastatin  80 mg Oral QHS   insulin aspart  0-6 Units Subcutaneous TID WC   mouth rinse  15 mL Mouth Rinse BID   metoprolol tartrate  25 mg Oral BID   multivitamin with minerals  1 tablet Oral Daily   Continuous Infusions:          Glade Lloyd, MD [facility] 12/30/2020, 7:52 AM

## 2020-12-30 NOTE — Progress Notes (Signed)
PT Cancellation Note  Patient Details Name: Myles Mallicoat MRN: [id number] DOB: 1931-06-26   Cancelled Treatment:    Reason Eval/Treat Not Completed: Other (comment)Note patient to be considered for home with Hospice vs. Residential Hospice. Will check back for confirmation of DC plan   Rada Hay 12/30/2020, 2:26 PM   Blanchard Kelch PT Acute Rehabilitation Services Pager [phone] Office [phone]

## 2020-12-30 NOTE — TOC Progression Note (Signed)
Transition of Care [facility]) - Progression Note    Patient Details  Name: Sophia Sperry MRN: [id number] Date of Birth: 1931/01/06  Transition of Care [facility] CM/SW Contact  Geni Bers, RN Phone Number: 12/30/2020, 4:06 PM  Clinical Narrative:    Referral for [facility]/hospice was given to in house rep, Gillian Scarce, Charity fundraiser.   Expected Discharge Plan: Skilled Nursing Facility (or Hospice) Barriers to Discharge: No Barriers Identified  Expected Discharge Plan and Services Expected Discharge Plan: Skilled Nursing Facility (or Hospice)       Living arrangements for the past 2 months: Single Family Home                                       Social Determinants of Health (SDOH) Interventions    Readmission Risk Interventions No flowsheet data found.

## 2020-12-30 NOTE — Progress Notes (Signed)
Progress Note  Patient Name: [name] Date of Encounter: 12/30/2020  Primary Cardiologist: Donato Schultz, MD  Subjective   Resting comfortably. Denies any CP, SOB. Knows she is at [facility]. Husband not yet present today.  Inpatient Medications    Scheduled Meds:  (feeding supplement) PROSource Plus  30 mL Oral Daily   aspirin EC  81 mg Oral Daily   atorvastatin  80 mg Oral QHS   insulin aspart  0-6 Units Subcutaneous TID WC   mouth rinse  15 mL Mouth Rinse BID   metoprolol tartrate  25 mg Oral BID   multivitamin with minerals  1 tablet Oral Daily   Continuous Infusions:  PRN Meds: acetaminophen **OR** acetaminophen, ipratropium-albuterol   Vital Signs    Vitals:   12/29/20 0600 12/29/20 1159 12/29/20 2032 12/30/20 0543  BP: 106/65 111/75 128/79 114/65  Pulse: 71 78 78 68  Resp: 20 (!) 25  20  Temp: 97.6 F (36.4 C) (!) 97.5 F (36.4 C) 98 F (36.7 C) 97.8 F (36.6 C)  TempSrc:  Oral Oral Oral  SpO2: 99% 100% 100% 95%  Weight:    70.4 kg  Height:        Intake/Output Summary (Last 24 hours) at 12/30/2020 0949 Last data filed at 12/30/2020 0600 Gross per 24 hour  Intake 360 ml  Output 850 ml  Net -490 ml   Last 3 Weights 12/30/2020 12/25/2020 12/24/2020  Weight (lbs) 155 lb 3.2 oz 154 lb 15.7 oz 155 lb 1.6 oz  Weight (kg) 70.398 kg 70.3 kg 70.353 kg     Telemetry    NSR, occasional PVCs, PACs - Personally Reviewed  Physical Exam   GEN: No acute distress.  HEENT: Normocephalic, atraumatic, sclera non-icteric. Neck: No JVD or bruits. Cardiac: RRR no murmurs, rubs, or gallops.  Respiratory: Decreased BS at bases bilaterally. Breathing is unlabored. GI: Soft, nontender, non-distended, BS +x 4. MS: no deformity. Extremities: No clubbing or cyanosis. No edema. Distal pedal pulses are 2+ and equal bilaterally. Neuro:  A+O to self, place, not time, 1983. Follows commands. Psych: Calm affect  Labs    High Sensitivity Troponin:   Recent Labs  Lab  12/24/20 0044 12/24/20 0235 12/24/20 0427 12/25/20 0207 12/25/20 0407  TROPONINIHS 451* 546* 557* 423* 420*      Cardiac EnzymesNo results for input(s): TROPONINI in the last 168 hours. No results for input(s): TROPIPOC in the last 168 hours.   Chemistry Recent Labs  Lab 12/23/20 2200 12/24/20 0044 12/25/20 0207 12/28/20 0452 12/29/20 0455 12/30/20 0501  NA 144 145   < > 138 140 140  K 4.8 4.8   < > 6.1* 4.7 5.5*  CL 109 107   < > 102 104 105  CO2 25 27   < > 26 29 27   GLUCOSE 110* 116*   < > 129* 114* 106*  BUN 33* 33*   < > 70* 76* 75*  CREATININE 1.91* 1.97*   < > 2.62* 2.37* 2.20*  CALCIUM 8.5* 9.0   < > 8.4* 8.7* 8.6*  PROT 6.4* 6.4*  --   --   --   --   ALBUMIN 3.7 3.8  --   --   --   --   AST 17 18  --   --   --   --   ALT 12 13  --   --   --   --   ALKPHOS 83 83  --   --   --   --  BILITOT 0.5 0.9  --   --   --   --   GFRNONAA 25* 24*   < > 17* 19* 21*  ANIONGAP 10 11   < > 10 7 8    < > = values in this interval not displayed.     Hematology Recent Labs  Lab 12/28/20 0452 12/29/20 0455 12/30/20 0501  WBC 8.1 6.4 6.2  RBC 4.11 3.29* 3.27*  HGB 13.8 11.0* 10.9*  HCT 43.8 35.3* 33.5*  MCV 106.6* 107.3* 102.4*  MCH 33.6 33.4 33.3  MCHC 31.5 31.2 32.5  RDW 14.5 14.1 14.0  PLT 99* 123* 118*    BNP Recent Labs  Lab 12/23/20 1648  BNP 106.8*     DDimer  Recent Labs  Lab 12/24/20 0427 12/25/20 0207  DDIMER 1.66* 0.91*     Radiology    No results found.  Cardiac Studies  Echo 12/24/20 IMPRESSIONS     1. Left ventricular ejection fraction, by estimation, is <20%. The left  ventricle has severely decreased function. The left ventricle demonstrates  global hypokinesis. The left ventricular internal cavity size was  moderately dilated. Left ventricular  diastolic parameters are indeterminate.   2. Right ventricular systolic function is normal. The right ventricular  size is normal. There is moderately elevated pulmonary artery systolic   pressure.   3. Left atrial size was moderately dilated.   4. Moderate pleural effusion in the left lateral region.   5. The mitral valve is normal in structure. Mild to moderate mitral valve  regurgitation. No evidence of mitral stenosis.   6. The aortic valve is tricuspid. Aortic valve regurgitation is not  visualized. Mild aortic valve sclerosis is present, with no evidence of  aortic valve stenosis.   7. The inferior vena cava is dilated in size with <50% respiratory  variability, suggesting right atrial pressure of 15 mmHg.   FINDINGS   Left Ventricle: Left ventricular ejection fraction, by estimation, is  <20%. The left ventricle has severely decreased function. The left  ventricle demonstrates global hypokinesis. The left ventricular internal  cavity size was moderately dilated. There  is no left ventricular hypertrophy. Left ventricular diastolic parameters  are indeterminate.   Right Ventricle: The right ventricular size is normal.Right ventricular  systolic function is normal. There is moderately elevated pulmonary artery  systolic pressure. The tricuspid regurgitant velocity is 3.01 m/s, and  with an assumed right atrial  pressure of 15 mmHg, the estimated right ventricular systolic pressure is  51.2 mmHg.   Left Atrium: Left atrial size was moderately dilated.   Right Atrium: Right atrial size was normal in size.   Pericardium: Trivial pericardial effusion is present.   Mitral Valve: The mitral valve is normal in structure. Mild to moderate  mitral valve regurgitation. No evidence of mitral valve stenosis.   Tricuspid Valve: The tricuspid valve is normal in structure. Tricuspid  valve regurgitation is trivial. No evidence of tricuspid stenosis.   Aortic Valve: The aortic valve is tricuspid. Aortic valve regurgitation is  not visualized. Mild aortic valve sclerosis is present, with no evidence  of aortic valve stenosis. Aortic valve mean gradient measures 2.0 mmHg.   Aortic valve peak gradient  measures 4.3 mmHg. Aortic valve area, by VTI measures 1.79 cm.   Pulmonic Valve: The pulmonic valve was normal in structure. Pulmonic valve  regurgitation is trivial. No evidence of pulmonic stenosis.   Aorta: The aortic root is normal in size and structure.   Venous: The inferior vena  cava is dilated in size with less than 50%  respiratory variability, suggesting right atrial pressure of 15 mmHg.     Additional Comments: There is a moderate pleural effusion in the left    Patient Profile     87 y.o. female with CAD with NSTEMI 2010 s/p DES to LCx (nonobstructive residual disease treated medically, DM2, HTN, gout, dementia, CKD stage IIIb, acute hypoxic respiratory failure due to Covid-19 PNA 11/2020. Admitted with worsening SOB, lower extremity edema, and hypoxia. Found to have elevated troponin and severe cardiomyopathy EF <20% with global HK, mild-moderate MR.  Assessment & Plan    1. Elevated troponin/suspected NSTEMI (peak troponin 557) - could be primary ACS vs secondary demand process related to hypoxia and CHF - given advanced dementia, advanced age, renal failure, anemia, thrombocytopenia she is not a candidate for invasive ischemic assessment due to risks outweighing the benefits - treated medically with 48hr of IV heparin, ASA, statin (LDL 105), BB - heparin discontinued 6/16 (including DVT ppx) due to declining platelet count   2. Acute systolic CHF/cardiomyopathy - initially treated with IV Lasix which was stopped due to worsening renal failure - hypotension and kidney insufficiency prohibits aggressive GDMT (not on ACEI, ARB, ARNI, spironolactone due to this) - CXR 6/16 with improving pulmonary edema but bilateral pleural effusions L>R -> diuretic not r/s due to kidney insufficiency / poor HD candidate - continue BB, consider consolidating to Toprol at discharge if appropriate - will discuss continued plan with MD if any new recs re:  diuretic   3. NSVT - no further episodes on telemetry, only occasional PACs/PVCs - not felt to be a candidate for ICD - continue BB as tolerated   4. AKI superimposed on CKD stage IIIb with declining UOP, hyperkalemia - previous baseline 1.5-1.8 - peaked at 2.82, downtrending to 2.20 today - rx of hyperkalemia per medical team   5. Dementia - fairly advanced per notes - remains calm   6. Worsening anemia/thrombocytopenia - platelet declined to 77 this admission, prompting d/c of heparin, remains relatively stable at 118  - Hgb 10.9 - further management per medicine    No major new recommendations from last week - continue to feel hospice is appropriate. Appreciate palliative medicine involvement.   For questions or updates, please contact [facility] Please consult [email or web address] for contact info under Cardiology/STEMI.  Signed, Laurann Montana, PA-C 12/30/2020, 9:49 AM

## 2020-12-30 NOTE — Care Management Important Message (Signed)
Important Message  Patient Details IM Letter given to the Patient. Name: [name] MRN: [id number] Date of Birth: 1931-05-13   Medicare Important Message Given:  Yes     Caren Macadam 12/30/2020, 11:58 AM

## 2020-12-30 NOTE — Progress Notes (Signed)
Civil engineer, contracting [facility]) Hospital Liaison: RN note      Made aware by [facility] of referral for hospice at home.  Chart and patient information under review by [facility] physician. Hospice eligibility pending currently.      ACC liaisons will reach out to family tomorrow.   Thank you for the opportunity to participate in this patient's care.   Gillian Scarce, BSN, RN       [facility] Liaison (listed on AMION under Hospice /[phone]  [phone] (24h on call)

## 2020-12-31 DIAGNOSIS — I214 Non-ST elevation (NSTEMI) myocardial infarction: Secondary | ICD-10-CM | POA: Diagnosis not present

## 2020-12-31 DIAGNOSIS — Z515 Encounter for palliative care: Secondary | ICD-10-CM | POA: Diagnosis not present

## 2020-12-31 DIAGNOSIS — I5033 Acute on chronic diastolic (congestive) heart failure: Secondary | ICD-10-CM | POA: Diagnosis not present

## 2020-12-31 DIAGNOSIS — N189 Chronic kidney disease, unspecified: Secondary | ICD-10-CM | POA: Diagnosis not present

## 2020-12-31 DIAGNOSIS — Z7189 Other specified counseling: Secondary | ICD-10-CM

## 2020-12-31 DIAGNOSIS — N179 Acute kidney failure, unspecified: Secondary | ICD-10-CM | POA: Diagnosis not present

## 2020-12-31 LAB — BASIC METABOLIC PANEL
Anion gap: 8 (ref 5–15)
BUN: 79 mg/dL — ABNORMAL HIGH (ref 8–23)
CO2: 23 mmol/L (ref 22–32)
Calcium: 8.6 mg/dL — ABNORMAL LOW (ref 8.9–10.3)
Chloride: 109 mmol/L (ref 98–111)
Creatinine, Ser: 2.05 mg/dL — ABNORMAL HIGH (ref 0.44–1.00)
GFR, Estimated: 23 mL/min — ABNORMAL LOW (ref >60)
Glucose, Bld: 115 mg/dL — ABNORMAL HIGH (ref 70–99)
Potassium: 7.1 mmol/L (ref 3.5–5.1)
Sodium: 140 mmol/L (ref 135–145)

## 2020-12-31 LAB — RESP PANEL BY RT-PCR (FLU A&B, COVID) ARPGX2
Influenza A by PCR: NEGATIVE
Influenza B by PCR: NEGATIVE
SARS Coronavirus 2 by RT PCR: POSITIVE — AB

## 2020-12-31 LAB — POTASSIUM: Potassium: 6.3 mmol/L (ref 3.5–5.1)

## 2020-12-31 LAB — GLUCOSE, CAPILLARY
Glucose-Capillary: 104 mg/dL — ABNORMAL HIGH (ref 70–99)
Glucose-Capillary: 178 mg/dL — ABNORMAL HIGH (ref 70–99)
Glucose-Capillary: 165 mg/dL — ABNORMAL HIGH (ref 70–99)

## 2020-12-31 LAB — CBC
HCT: 32.9 % — ABNORMAL LOW (ref 36.0–46.0)
Hemoglobin: 10.5 g/dL — ABNORMAL LOW (ref 12.0–15.0)
MCH: 33.4 pg (ref 26.0–34.0)
MCHC: 31.9 g/dL (ref 30.0–36.0)
MCV: 104.8 fL — ABNORMAL HIGH (ref 80.0–100.0)
Platelets: 88 10*3/uL — ABNORMAL LOW (ref 150–400)
RBC: 3.14 MIL/uL — ABNORMAL LOW (ref 3.87–5.11)
RDW: 14 % (ref 11.5–15.5)
WBC: 6.9 10*3/uL (ref 4.0–10.5)
nRBC: 0 % (ref 0.0–0.2)

## 2020-12-31 LAB — MAGNESIUM: Magnesium: 2.1 mg/dL (ref 1.7–2.4)

## 2020-12-31 MED ORDER — MORPHINE SULFATE (PF) 2 MG/ML IV SOLN
1.0000 mg | INTRAVENOUS | Status: DC | PRN
  Filled 2020-12-31: qty 1

## 2020-12-31 MED ORDER — MORPHINE SULFATE (PF) 2 MG/ML IV SOLN
1.0000 mg | INTRAVENOUS | Status: DC | PRN
  Administered 2020-12-31: 1 mg via INTRAVENOUS

## 2020-12-31 MED ORDER — METOPROLOL TARTRATE 25 MG PO TABS
25.0000 mg | ORAL_TABLET | Freq: Two times a day (BID) | ORAL | Status: AC
Start: 2020-12-31 — End: ?

## 2020-12-31 MED ORDER — SODIUM ZIRCONIUM CYCLOSILICATE 10 G PO PACK
10.0000 g | PACK | Freq: Once | ORAL | Status: AC
  Administered 2020-12-31: 10 g via ORAL
  Filled 2020-12-31: qty 1

## 2020-12-31 NOTE — Progress Notes (Signed)
Patient had 11 runs of V-tach, patient in bed resting, denies any pain/distress, vitals within normal limit. Dr. Hanley Ben notified, will continue to monitor patient.

## 2020-12-31 NOTE — Progress Notes (Signed)
Daily Progress Note   Patient Name: [name]       Date: 12/31/2020 DOB: [birth year]  Age: 87 y.o. MRN#: [id number] Attending Physician: Glade Lloyd, MD Primary Care Physician: Rodrigo Ran, MD Admit Date: 12/23/2020  Reason for Consultation/Follow-up: Establishing goals of care  Subjective: Patient is resting in bed, appears weak, is not eating well.  Appears to have mild generalized discomfort.  Is very frail, is not able to verbalize to me this morning, husband at bedside.   Questions and concerns addressed.   PMT will continue to support holistically.  Length of Stay: 8  Current Medications: Scheduled Meds:   (feeding supplement) PROSource Plus  30 mL Oral Daily   aspirin EC  81 mg Oral Daily   atorvastatin  80 mg Oral QHS   insulin aspart  0-6 Units Subcutaneous TID WC   mouth rinse  15 mL Mouth Rinse BID   metoprolol tartrate  25 mg Oral BID   multivitamin with minerals  1 tablet Oral Daily    Continuous Infusions:   PRN Meds: acetaminophen **OR** acetaminophen, ipratropium-albuterol  Physical Exam      General: Elderly, chronically ill-appearing, in no acute distress.   HEENT: No bruits, no goiter, no JVD Heart: Regular rate and rhythm. No murmur appreciated. Lungs: Decreased air movement, scattered crackles at bases Abdomen: Soft, nontender, nondistended, positive bowel sounds.   Ext: No significant edema Skin: Warm and dry     Vital Signs: BP 124/62 (BP Location: Right Arm)   Pulse 69   Temp 97.7 F (36.5 C) (Oral)   Resp 20   Ht 5\' 4"  (1.626 m)   Wt 70.4 kg   SpO2 100%   BMI 26.64 kg/m  SpO2: SpO2: 100 % O2 Device: O2 Device: Nasal Cannula O2 Flow Rate: O2 Flow Rate (L/min): 3 L/min  Intake/output summary:  Intake/Output Summary (Last 24 hours)  at 12/31/2020 1026 Last data filed at 12/31/2020 0557 Gross per 24 hour  Intake --  Output 971 ml  Net -971 ml    LBM: Last BM Date:  (PTA) Baseline Weight: Weight: 70.9 kg Most recent weight: Weight: 70.4 kg       Palliative Assessment/Data:    Flowsheet Rows    Flowsheet Row Most Recent Value  Intake Tab   Referral Department Hospitalist  Unit  at Time of Referral Med/Surg Unit  Palliative Care Primary Diagnosis Cardiac  Date Notified 12/26/20  Palliative Care Type New Palliative care  Reason for referral Clarify Goals of Care  Date of Admission 12/23/20  Date first seen by Palliative Care 12/27/20  # of days Palliative referral response time 1 Day(s)  # of days IP prior to Palliative referral 3  Clinical Assessment   Palliative Performance Scale Score 40%  Psychosocial & Spiritual Assessment   Palliative Care Outcomes   Patient/Family meeting held? Yes  Who was at the meeting? Daughter via phone  Palliative Care Outcomes Clarified goals of care       Patient Active Problem List   Diagnosis Date Noted   Acute congestive heart failure (HCC)    Acute on chronic diastolic (congestive) heart failure (HCC) 12/23/2020   SOB (shortness of breath) 12/23/2020   Elevated troponin 12/23/2020   Acute kidney injury superimposed on CKD (HCC) 12/23/2020   Hyperkalemia 12/23/2020   COVID-19 virus infection 12/04/2020   Pneumonia due to COVID-19 virus 12/04/2020   Dementia without behavioral disturbance (HCC)    Pneumonia 12/03/2020   Chronic kidney disease, stage 3b (HCC) 12/03/2020   Chronic diastolic CHF (congestive heart failure) (HCC) 12/03/2020   Enteritis due to Clostridium difficile 08/18/2014   Hypomagnesemia 08/18/2014   Diarrhea 08/17/2014   Dehydration 08/17/2014   Hyperlipidemia 04/07/2010   CARDIOMYOPATHY, ISCHEMIC 09/27/2009   DM2 (diabetes mellitus, type 2) (HCC) 06/26/2009   GOUT, UNSPECIFIED 06/26/2009   Obesity 06/26/2009   LOW BACK PAIN, CHRONIC  06/26/2009   HYPERTENSION, BENIGN 06/04/2009   CORONARY ATHEROSCLEROSIS NATIVE CORONARY ARTERY 06/04/2009   CHEST PAIN, PRECORDIAL 06/04/2009    Palliative Care Assessment & Plan   Patient Profile: 87 y.o. female  with past medical history of CAD with NSTEMI and stent placement, diabetes, dementia, chronic kidney disease, hypertension, COVID a month ago admitted on 12/23/2020 with CHF herbs extubation and troponin elevation concerning for NSTEMI.  Her renal function has worsened as well.  Cardiology is following and she was started on heparin drip and Lasix.  Daughter reports that family would be open to consideration for hospice support moving forward with palliative consulted for goals of care.  Recommendations/Plan: DNR/DNI Continue current interventions without escalation Family is considering options for Ms. Epping at time of discharge.  They would like to meet with [facility] collective to discuss potential for home with hospice versus residential hospice.   Code Status:    Code Status Orders  (From admission, onward)           Start     Ordered   12/23/20 2031  Do not attempt resuscitation (DNR)  Continuous       Question Answer Comment  In the event of cardiac or respiratory ARREST Do not call a "code blue"   In the event of cardiac or respiratory ARREST Do not perform Intubation, CPR, defibrillation or ACLS   In the event of cardiac or respiratory ARREST Use medication by any route, position, wound care, and other measures to relive pain and suffering. May use oxygen, suction and manual treatment of airway obstruction as needed for comfort.      12/23/20 2032           Code Status History     Date Active Date Inactive Code Status Order ID Comments User Context   12/23/2020 1912 12/23/2020 2032 DNR [id number]  Angie Fava, DO ED   12/23/2020 1857 12/23/2020 1912 Full Code [id number]  Angie Fava, DO ED   12/04/2020 0045 12/05/2020 2011 Full Code [id number]   Briscoe Deutscher, MD ED   08/17/2014 1714 08/21/2014 1827 Full Code [id number]  Leroy Sea, MD ED       Prognosis: Likely up to 2 weeks is possible because patient appears to have ongoing generalized weakness, markedly limited to nil oral intake and possibly with escalating symptom burden.   Discharge Planning: To Be Determined  Care plan was discussed with husband,  and interdisciplinary team. Thank you for allowing the Palliative Medicine Team to assist in the care of this patient.   Time In: 9 Time Out: 9.35 Total Time 35 Prolonged Time Billed No   Greater than 50%  of this time was spent counseling and coordinating care related to the above assessment and plan.  Rosalin Hawking, MD  Please contact Palliative Medicine Team phone at [phone] for questions and concerns.

## 2020-12-31 NOTE — Progress Notes (Signed)
Report called to Tiffany at  [facility], also informed them that patient's COVID test is positive, okay to send patient. [facility] called, waiting to pick patient up.

## 2020-12-31 NOTE — Discharge Summary (Signed)
Physician Discharge Summary  [name] FGH:[id number] DOB: [birth year] DOA: 12/23/2020  PCP: Rodrigo Ran, MD  Admit date: 12/23/2020 Discharge date: 12/31/2020  Admitted From: Home Disposition: Residential hospice  Recommendations for Outpatient Follow-up:  Follow up with residential hospice at earliest convenience   Home Health: No Equipment/Devices: None  Discharge Condition: Poor CODE STATUS: DNR  diet recommendation: As per hospice/comfort measures  Brief/Interim Summary: 87 year old female with history of CAD with non-STEMI and stent placement, diabetes mellitus type 2, dementia, chronic kidney disease stage IIIb, hypertension, COVID-19 pneumonia last month requiring oxygen upon discharge presented with worsening shortness of breath.  She was admitted for CHF exacerbation with troponin elevation.  Cardiology was consulted.  She was started on heparin drip and IV Lasix.  Subsequently, heparin drip was discontinued and IV Lasix held because of worsening kidney function.  Cardiology is now recommending palliative care consultation for goals of care discussion.  Palliative care was consulted.  During the hospitalization, her condition has gotten worse with worsening renal function, urine output and oral intake.  Family is now agreeable for residential hospice.  She will be discharged to residential hospice once bed is available.  Discharge Diagnoses:   Acute systolic heart failure Non-STEMI in a patient with history of CAD/MI with prior stenting Diabetes mellitus type 2 AKI on CKD stage IIIb Hyperkalemia Hypertension Thrombocytopenia Chronic macrocytic anemia Generalized deconditioning Dementia Chronic hypoxic respiratory failure History of recent COVID-19 pneumonia  Plan -During the hospitalization, her condition has gotten worse with worsening renal function, urine output and oral intake.  Family is now agreeable for residential hospice.  She will be discharged to residential  hospice once bed is available.  Discharge Instructions  Discharge Instructions     Diet - low sodium heart healthy   Complete by: As directed    Increase activity slowly   Complete by: As directed       Allergies as of 12/31/2020       Reactions   Allopurinol Hives   Sulfonamide Derivatives Hives        Medication List     STOP taking these medications    Accu-Chek Aviva Plus test strip Generic drug: glucose blood   accu-chek multiclix lancets   febuxostat 40 MG tablet Commonly known as: ULORIC   ferrous sulfate 325 (65 FE) MG tablet   furosemide 40 MG tablet Commonly known as: LASIX   losartan 100 MG tablet Commonly known as: COZAAR   pantoprazole 40 MG tablet Commonly known as: PROTONIX   polyvinyl alcohol 1.4 % ophthalmic solution Commonly known as: LIQUIFILM TEARS   rivastigmine 4.6 mg/24hr Commonly known as: EXELON   vitamin B-12 500 MCG tablet Commonly known as: CYANOCOBALAMIN       TAKE these medications    acetaminophen 325 MG tablet Commonly known as: TYLENOL Take 650 mg by mouth every 6 (six) hours as needed for mild pain, fever or headache.   aspirin 81 MG tablet Take 81 mg by mouth daily.   atorvastatin 80 MG tablet Commonly known as: LIPITOR Take 80 mg by mouth at bedtime.   metoprolol tartrate 25 MG tablet Commonly known as: LOPRESSOR Take 1 tablet (25 mg total) by mouth 2 (two) times daily.        Allergies  Allergen Reactions   Allopurinol Hives   Sulfonamide Derivatives Hives    Consultations: Cardiology/palliative care consultation   Procedures/Studies: DG Chest 2 View  Result Date: 12/26/2020 CLINICAL DATA:  Follow-up pulmonary edema EXAM: CHEST - 2 VIEW  COMPARISON:  12/23/2020 FINDINGS: Cardiac shadow remains enlarged. Aortic calcifications are again seen. Bilateral pleural effusions are noted left greater than right with central vascular congestion and pulmonary edema. The overall edema pattern has  improved slightly in the interval from the prior exam. No new focal abnormality is noted. IMPRESSION: Improving pulmonary edema. Bilateral pleural effusions left greater than right. Electronically Signed   By: Alcide Clever M.D.   On: 12/26/2020 16:53   DG Chest 2 View  Result Date: 12/03/2020 CLINICAL DATA:  Pneumonia. EXAM: CHEST - 2 VIEW COMPARISON:  Nov 18, 2010. FINDINGS: The heart size and mediastinal contours are within normal limits. No pneumothorax is noted. Right lung is clear. Mild left basilar atelectasis or infiltrate is noted with associated small left pleural effusion. The visualized skeletal structures are unremarkable. IMPRESSION: Mild left basilar atelectasis or infiltrate is noted with associated small left pleural effusion. Electronically Signed   By: Lupita Raider M.D.   On: 12/03/2020 15:18   DG Chest Port 1 View  Result Date: 12/23/2020 CLINICAL DATA:  Shortness of breath EXAM: PORTABLE CHEST 1 VIEW COMPARISON:  12/03/2020 FINDINGS: The heart and mediastinal contours are within normal limits. Diffuse bilateral interstitial pulmonary opacity and bilateral pleural effusions, increased compared to prior examination. IMPRESSION: Diffuse bilateral interstitial pulmonary opacity and bilateral pleural effusions, increased compared to prior examination, concerning for pulmonary edema. Electronically Signed   By: Lauralyn Primes M.D.   On: 12/23/2020 17:12   ECHOCARDIOGRAM COMPLETE  Result Date: 12/24/2020    ECHOCARDIOGRAM REPORT   Patient Name:   [name] [facility] Date of Exam: 12/24/2020 Medical Rec #:  [id number]  Height:       64.0 in Accession #:    [id number] Weight:       155.1 lb Date of Birth:  [birth year]  BSA:          1.756 m Patient Age:    89 years   BP:           144/95 mmHg Patient Gender: F          HR:           88 bpm. Exam Location:  Inpatient Procedure: 2D Echo, Cardiac Doppler and Color Doppler Indications:    CHF  History:        Patient has no prior history of Echocardiogram  examinations.                 Previous Myocardial Infarction and CAD, Signs/Symptoms:Chest                 Pain; Risk Factors:Diabetes and Hypertension.  Sonographer:    Neomia Dear RDCS Referring Phys: [id number] JUSTIN B HOWERTER IMPRESSIONS  1. Left ventricular ejection fraction, by estimation, is <20%. The left ventricle has severely decreased function. The left ventricle demonstrates global hypokinesis. The left ventricular internal cavity size was moderately dilated. Left ventricular diastolic parameters are indeterminate.  2. Right ventricular systolic function is normal. The right ventricular size is normal. There is moderately elevated pulmonary artery systolic pressure.  3. Left atrial size was moderately dilated.  4. Moderate pleural effusion in the left lateral region.  5. The mitral valve is normal in structure. Mild to moderate mitral valve regurgitation. No evidence of mitral stenosis.  6. The aortic valve is tricuspid. Aortic valve regurgitation is not visualized. Mild aortic valve sclerosis is present, with no evidence of aortic valve stenosis.  7. The inferior vena cava is dilated in size with <50% respiratory  variability, suggesting right atrial pressure of 15 mmHg. FINDINGS  Left Ventricle: Left ventricular ejection fraction, by estimation, is <20%. The left ventricle has severely decreased function. The left ventricle demonstrates global hypokinesis. The left ventricular internal cavity size was moderately dilated. There is no left ventricular hypertrophy. Left ventricular diastolic parameters are indeterminate. Right Ventricle: The right ventricular size is normal.Right ventricular systolic function is normal. There is moderately elevated pulmonary artery systolic pressure. The tricuspid regurgitant velocity is 3.01 m/s, and with an assumed right atrial pressure of 15 mmHg, the estimated right ventricular systolic pressure is 51.2 mmHg. Left Atrium: Left atrial size was moderately dilated. Right  Atrium: Right atrial size was normal in size. Pericardium: Trivial pericardial effusion is present. Mitral Valve: The mitral valve is normal in structure. Mild to moderate mitral valve regurgitation. No evidence of mitral valve stenosis. Tricuspid Valve: The tricuspid valve is normal in structure. Tricuspid valve regurgitation is trivial. No evidence of tricuspid stenosis. Aortic Valve: The aortic valve is tricuspid. Aortic valve regurgitation is not visualized. Mild aortic valve sclerosis is present, with no evidence of aortic valve stenosis. Aortic valve mean gradient measures 2.0 mmHg. Aortic valve peak gradient measures 4.3 mmHg. Aortic valve area, by VTI measures 1.79 cm. Pulmonic Valve: The pulmonic valve was normal in structure. Pulmonic valve regurgitation is trivial. No evidence of pulmonic stenosis. Aorta: The aortic root is normal in size and structure. Venous: The inferior vena cava is dilated in size with less than 50% respiratory variability, suggesting right atrial pressure of 15 mmHg.  Additional Comments: There is a moderate pleural effusion in the left lateral region.  LEFT VENTRICLE PLAX 2D LVIDd:         5.90 cm LVIDs:         5.80 cm LV PW:         0.90 cm LV IVS:        0.80 cm LVOT diam:     2.20 cm LV SV:         34 LV SV Index:   19 LVOT Area:     3.80 cm  LV Volumes (MOD) LV vol d, MOD A2C: 115.0 ml LV vol d, MOD A4C: 105.0 ml LV vol s, MOD A2C: 87.6 ml LV vol s, MOD A4C: 95.3 ml LV SV MOD A2C:     27.4 ml LV SV MOD A4C:     105.0 ml LV SV MOD BP:      18.1 ml RIGHT VENTRICLE RV Basal diam:  2.90 cm RV Mid diam:    2.10 cm RV S prime:     9.90 cm/s TAPSE (M-mode): 1.4 cm LEFT ATRIUM           Index       RIGHT ATRIUM           Index LA diam:      4.40 cm 2.51 cm/m  RA Area:     11.10 cm LA Vol (A2C): 72.8 ml 41.46 ml/m RA Volume:   23.30 ml  13.27 ml/m  AORTIC VALVE                   PULMONIC VALVE AV Area (Vmax):    2.00 cm    PV Vmax:       0.76 m/s AV Area (Vmean):   1.83 cm     PV Vmean:      51.300 cm/s AV Area (VTI):     1.79 cm    PV VTI:  0.118 m AV Vmax:           104.00 cm/s PV Peak grad:  2.3 mmHg AV Vmean:          71.700 cm/s PV Mean grad:  1.0 mmHg AV VTI:            0.187 m AV Peak Grad:      4.3 mmHg AV Mean Grad:      2.0 mmHg LVOT Vmax:         54.70 cm/s LVOT Vmean:        34.500 cm/s LVOT VTI:          0.088 m LVOT/AV VTI ratio: 0.47  AORTA Ao Root diam: 2.90 cm Ao Asc diam:  3.00 cm MITRAL VALVE                 TRICUSPID VALVE MV Area (PHT): 5.23 cm      TR Peak grad:   36.2 mmHg MV Decel Time: 145 msec      TR Vmax:        301.00 cm/s MR Peak grad:    79.6 mmHg MR Mean grad:    54.0 mmHg   SHUNTS MR Vmax:         446.00 cm/s Systemic VTI:  0.09 m MR Vmean:        346.0 cm/s  Systemic Diam: 2.20 cm MR PISA:         1.57 cm MR PISA Eff ROA: 8 mm MR PISA Radius:  0.50 cm MV E velocity: 63.40 cm/s MV A velocity: 67.80 cm/s MV E/A ratio:  0.94 Olga Millers MD Electronically signed by Olga Millers MD Signature Date/Time: 12/24/2020/1:55:31 PM    Final       Subjective: Patient seen and examined at bedside.  Very poor historian.  Wakes up only very slightly, hardly answers any questions.  No overnight fever or agitation reported.  Discharge Exam: Vitals:   12/31/20 0806 12/31/20 1246  BP: 124/62 122/64  Pulse: 69 71  Resp: 20 18  Temp: 97.7 F (36.5 C) 98.2 F (36.8 C)  SpO2: 100% 100%    General exam: Elderly female lying in bed.  Currently on 3 L oxygen by nasal cannula.  No acute distress.  Extremely poor historian.  Sleepy, wakes up slightly, does not participate in conversation much. Respiratory system: Bilateral decreased breath sounds at bases with bibasilar crackles  cardiovascular system: S1-S2 heard, rate controlled gastrointestinal system: Abdomen is distended slightly, soft and nontender.  Normal bowel sounds heard  extremities: No cyanosis; bilateral lower extremity edema.      The results of significant diagnostics from this  hospitalization (including imaging, microbiology, ancillary and laboratory) are listed below for reference.     Microbiology: No results found for this or any previous visit (from the past 240 hour(s)).   Labs: BNP (last 3 results) Recent Labs    12/03/20 1432 12/23/20 1648  BNP 2,011.1* 106.8*   Basic Metabolic Panel: Recent Labs  Lab 12/27/20 0412 12/28/20 0452 12/29/20 0455 12/30/20 0501 12/31/20 0755  NA 140 138 140 140 140  K 5.3* 6.1* 4.7 5.5* 7.1*  CL 105 102 104 105 109  CO2 27 26 29 27 23   GLUCOSE 112* 129* 114* 106* 115*  BUN 63* 70* 76* 75* 79*  CREATININE 2.82* 2.62* 2.37* 2.20* 2.05*  CALCIUM 8.6* 8.4* 8.7* 8.6* 8.6*  MG 1.7 1.8  --  2.0 2.1   Liver Function Tests: No results for input(s):  AST, ALT, ALKPHOS, BILITOT, PROT, ALBUMIN in the last 168 hours. No results for input(s): LIPASE, AMYLASE in the last 168 hours. No results for input(s): AMMONIA in the last 168 hours. CBC: Recent Labs  Lab 12/27/20 0412 12/28/20 0452 12/29/20 0455 12/30/20 0501 12/31/20 0442  WBC 7.9 8.1 6.4 6.2 6.9  HGB 10.8* 13.8 11.0* 10.9* 10.5*  HCT 34.0* 43.8 35.3* 33.5* 32.9*  MCV 105.3* 106.6* 107.3* 102.4* 104.8*  PLT 112* 99* 123* 118* 88*   Cardiac Enzymes: No results for input(s): CKTOTAL, CKMB, CKMBINDEX, TROPONINI in the last 168 hours. BNP: Invalid input(s): POCBNP CBG: Recent Labs  Lab 12/30/20 1211 12/30/20 1700 12/30/20 2115 12/31/20 0724 12/31/20 1215  GLUCAP 130* 147* 125* 104* 178*   D-Dimer No results for input(s): DDIMER in the last 72 hours. Hgb A1c No results for input(s): HGBA1C in the last 72 hours. Lipid Profile No results for input(s): CHOL, HDL, LDLCALC, TRIG, CHOLHDL, LDLDIRECT in the last 72 hours. Thyroid function studies No results for input(s): TSH, T4TOTAL, T3FREE, THYROIDAB in the last 72 hours.  Invalid input(s): FREET3 Anemia work up No results for input(s): VITAMINB12, FOLATE, FERRITIN, TIBC, IRON, RETICCTPCT in the last  72 hours. Urinalysis    Component Value Date/Time   COLORURINE STRAW (A) 12/23/2020 1938   APPEARANCEUR CLEAR 12/23/2020 1938   LABSPEC 1.009 12/23/2020 1938   PHURINE 6.0 12/23/2020 1938   GLUCOSEU NEGATIVE 12/23/2020 1938   HGBUR NEGATIVE 12/23/2020 1938   BILIRUBINUR NEGATIVE 12/23/2020 1938   KETONESUR NEGATIVE 12/23/2020 1938   PROTEINUR NEGATIVE 12/23/2020 1938   NITRITE NEGATIVE 12/23/2020 1938   LEUKOCYTESUR NEGATIVE 12/23/2020 1938   Sepsis Labs Invalid input(s): PROCALCITONIN,  WBC,  LACTICIDVEN Microbiology No results found for this or any previous visit (from the past 240 hour(s)).   Time coordinating discharge: 35 minutes  SIGNED:   Glade Lloyd, MD  [facility] 12/31/2020, 12:55 PM

## 2020-12-31 NOTE — Progress Notes (Signed)
Nutrition Brief Note  Chart reviewed. Patient last seen by a RD on 12/25/20. Patient has transitioned to comfort care and discharge order and summary in place for d/c to [facility].    No further nutrition interventions planned at this time.      Trenton Gammon, MS, RD, LDN, CNSC Inpatient Clinical Dietitian RD pager # available in [facility]  After hours/weekend pager # available in [facility]

## 2020-12-31 NOTE — Progress Notes (Signed)
Patient ID: [name], female   DOB: 1931/03/06, 87 y.o.   MRN: [id number]  PROGRESS NOTE    [name]  XIP:[id number] DOB: 04/11/1931 DOA: 12/23/2020 PCP: Rodrigo Ran, MD   Brief Narrative:  87 year old female with history of CAD with non-STEMI and stent placement, diabetes mellitus type 2, dementia, chronic kidney disease stage IIIb, hypertension, COVID-19 pneumonia last month requiring oxygen upon discharge presented with worsening shortness of breath.  She was admitted for CHF exacerbation with troponin elevation.  Cardiology was consulted.  She was started on heparin drip and IV Lasix.  Subsequently, heparin drip was discontinued and IV Lasix held because of worsening kidney function.  Cardiology is now recommending palliative care consultation for goals of care discussion.  Assessment & Plan:   Acute systolic heart failure NSTEMI/Elevated troponin probably secondary to above History of CAD/MI with prior stenting -Echo shows EF of less than 20%. Lasix held on 12/26/2020 because of worsening renal function.  Continue aspirin, statin and metoprolol.  Heparin drip has been discontinued by cardiology. -Strict input and output, daily weights, fluid restriction.  Negative fluid balance of 1605.4 cc since admission.  Urine output worsening. -Cardiology is recommending palliative care and hospice    Diabetes mellitus type 2 -Continue CBGs with SSI  Acute kidney injury on chronic kidney disease stage IIIb -Baseline creatinine of 1.5-2.  Creatinine 2.05 today.  Hyperkalemia -Potassium 7.1 this morning.  Will give 1 dose of Lokelma.    Hypertension -Blood pressure on the lower side.  Continue beta-blocker  Thrombocytopenia -Questionable cause. No signs of bleeding.  Monitor  Chronic macrocytic anemia -Questionable cause.  TSH, folate and vitamin B12 levels normal  Generalized deconditioning -Overall prognosis is very poor.  Oral intake is very poor.  I think she is appropriate for  residential hospice.  Palliative care following.  Chronic hypoxic respiratory failure History of recent COVID-19 pneumonia -Currently on 3 L oxygen via nasal cannula.  Incentive spirometry.  Dementia -Fall precautions  DVT prophylaxis: SCDs.  Subcutaneous heparin discontinued because of thrombocytopenia  code Status: DNR Family Communication: Spoke to husband at bedside on 12/27/2020 Disposition Plan: Status is: Inpatient  Remains inpatient appropriate because:Inpatient level of care appropriate due to severity of illness  Dispo: The patient is from: Home              Anticipated d/c is to: Possible residential hospice once bed is available if she qualifies.              Patient currently is medically stable to d/c.  To residential hospice   Difficult to place patient No   Consultants: Cardiology.  Palliative care consultation  Procedures: Echo as below  Antimicrobials: None   Subjective: Patient seen and examined at bedside.  Very poor historian.  Wakes up only very slightly, hardly answers any questions.  No overnight fever or agitation reported.   Objective: Vitals:   12/30/20 2111 12/30/20 2125 12/31/20 0539 12/31/20 0806  BP: 128/68  119/69 124/62  Pulse: 75  69 69  Resp: (!) 25  (!) 23 20  Temp: 97.9 F (36.6 C)  97.6 F (36.4 C) 97.7 F (36.5 C)  TempSrc: Oral   Oral  SpO2: 98% 94% 97% 100%  Weight:      Height:        Intake/Output Summary (Last 24 hours) at 12/31/2020 1035 Last data filed at 12/31/2020 0557 Gross per 24 hour  Intake --  Output 971 ml  Net -971 ml  Filed Weights   12/24/20 0600 12/25/20 0642 12/30/20 0543  Weight: 70.4 kg 70.3 kg 70.4 kg    Examination:  General exam: Elderly female lying in bed.  Currently on 3 L oxygen by nasal cannula.  No acute distress.  Extremely poor historian.  Sleepy, wakes up slightly, does not participate in conversation much. Respiratory system: Bilateral decreased breath sounds at bases with  bibasilar crackles  cardiovascular system: S1-S2 heard, rate controlled gastrointestinal system: Abdomen is distended slightly, soft and nontender.  Normal bowel sounds heard  extremities: No cyanosis; bilateral lower extremity edema.  Data Reviewed: I have personally reviewed following labs and imaging studies  CBC: Recent Labs  Lab 12/27/20 0412 12/28/20 0452 12/29/20 0455 12/30/20 0501 12/31/20 0442  WBC 7.9 8.1 6.4 6.2 6.9  HGB 10.8* 13.8 11.0* 10.9* 10.5*  HCT 34.0* 43.8 35.3* 33.5* 32.9*  MCV 105.3* 106.6* 107.3* 102.4* 104.8*  PLT 112* 99* 123* 118* 88*    Basic Metabolic Panel: Recent Labs  Lab 12/27/20 0412 12/28/20 0452 12/29/20 0455 12/30/20 0501 12/31/20 0755  NA 140 138 140 140 140  K 5.3* 6.1* 4.7 5.5* 7.1*  CL 105 102 104 105 109  CO2 27 26 29 27 23   GLUCOSE 112* 129* 114* 106* 115*  BUN 63* 70* 76* 75* 79*  CREATININE 2.82* 2.62* 2.37* 2.20* 2.05*  CALCIUM 8.6* 8.4* 8.7* 8.6* 8.6*  MG 1.7 1.8  --  2.0 2.1    GFR: Estimated Creatinine Clearance: 18.3 mL/min (A) (by C-G formula based on SCr of 2.05 mg/dL (H)). Liver Function Tests: No results for input(s): AST, ALT, ALKPHOS, BILITOT, PROT, ALBUMIN in the last 168 hours.  No results for input(s): LIPASE, AMYLASE in the last 168 hours. No results for input(s): AMMONIA in the last 168 hours. Coagulation Profile: No results for input(s): INR, PROTIME in the last 168 hours. Cardiac Enzymes: No results for input(s): CKTOTAL, CKMB, CKMBINDEX, TROPONINI in the last 168 hours. BNP (last 3 results) No results for input(s): PROBNP in the last 8760 hours. HbA1C: No results for input(s): HGBA1C in the last 72 hours. CBG: Recent Labs  Lab 12/30/20 0736 12/30/20 1211 12/30/20 1700 12/30/20 2115 12/31/20 0724  GLUCAP 98 130* 147* 125* 104*    Lipid Profile: No results for input(s): CHOL, HDL, LDLCALC, TRIG, CHOLHDL, LDLDIRECT in the last 72 hours.  Thyroid Function Tests: No results for input(s):  TSH, T4TOTAL, FREET4, T3FREE, THYROIDAB in the last 72 hours.  Anemia Panel: No results for input(s): VITAMINB12, FOLATE, FERRITIN, TIBC, IRON, RETICCTPCT in the last 72 hours.  Sepsis Labs: No results for input(s): PROCALCITON, LATICACIDVEN in the last 168 hours.   No results found for this or any previous visit (from the past 240 hour(s)).       Radiology Studies: No results found.      Scheduled Meds:  (feeding supplement) PROSource Plus  30 mL Oral Daily   aspirin EC  81 mg Oral Daily   atorvastatin  80 mg Oral QHS   insulin aspart  0-6 Units Subcutaneous TID WC   mouth rinse  15 mL Mouth Rinse BID   metoprolol tartrate  25 mg Oral BID   multivitamin with minerals  1 tablet Oral Daily   Continuous Infusions:          12/18/2020, MD [facility] 12/31/2020, 10:35 AM

## 2020-12-31 NOTE — Progress Notes (Signed)
Civil engineer, contracting [facility]) Hospital Liaison note.     Received request from [facility] manager for family interest in [facility].  Chart reviewed and eligibility confirmed. Spoke with family to confirm interest and explain services. Family agreeable to transfer today. TOC aware.     Pending repeat Covid test patient can transfer today.  [facility] will notify TOC when registration paperwork has been completed to arrange transport.    RN please call report to [phone].   Thank you,      Elsie Saas, RN, [facility]        [facility] Liaison    [phone]

## 2020-12-31 NOTE — TOC Progression Note (Signed)
Transition of Care [facility]) - Progression Note    Patient Details  Name: [name] MRN: [id number] Date of Birth: [birth year]  Transition of Care [facility] CM/SW Contact  Geni Bers, RN Phone Number: 12/31/2020, 1:01 PM  Clinical Narrative:     Pt discharging to [facility].   Expected Discharge Plan: Skilled Nursing Facility (or Hospice) Barriers to Discharge: No Barriers Identified  Expected Discharge Plan and Services Expected Discharge Plan: Skilled Nursing Facility (or Hospice)       Living arrangements for the past 2 months: Single Family Home Expected Discharge Date: 12/31/20                                     Social Determinants of Health (SDOH) Interventions    Readmission Risk Interventions No flowsheet data found.

## 2020-12-31 NOTE — Progress Notes (Signed)
Patient unable to void, MD aware, order written for foley, 20fr foley placed, 150cc urine output.

## 2021-01-10 DEATH — deceased

## 2021-04-23 IMAGING — CT CT ABD-PELV W/O CM
2 of 4 series · 16 of 46 positions shown, 18 images · non-contrast
Comparison: None.

CLINICAL DATA: Right flank pain.

EXAM:
CT ABDOMEN AND PELVIS WITHOUT CONTRAST
TECHNIQUE: Multidetector CT imaging of the abdomen and pelvis was performed
following the standard protocol without IV contrast.

[Series 2: axial st · axial · 0.77mm/px · z∈[-502,-82]mm · 13 of 94 slices shown, 15 images]
[im 5/94  soft-tissue]
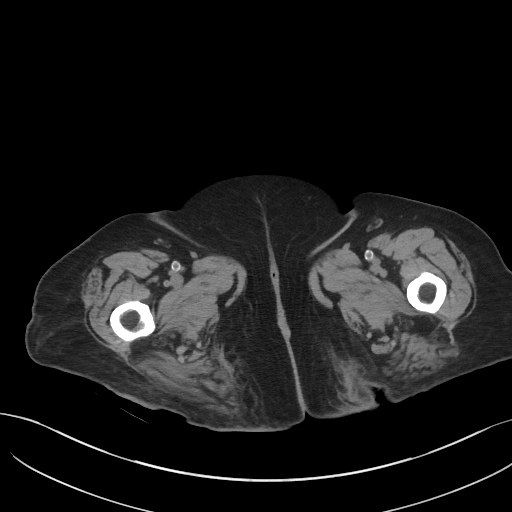
[im 5/94  bone]
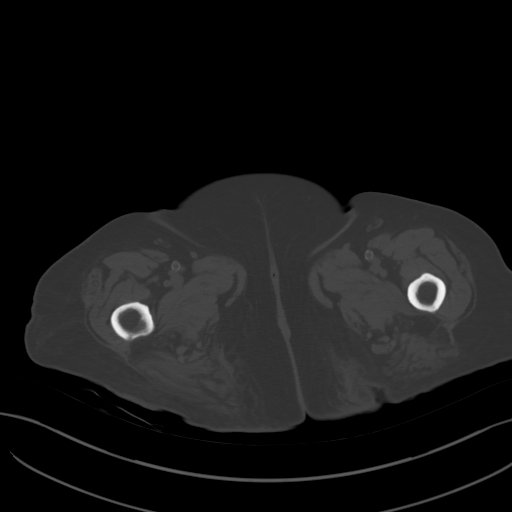
[im 15/94  soft-tissue]
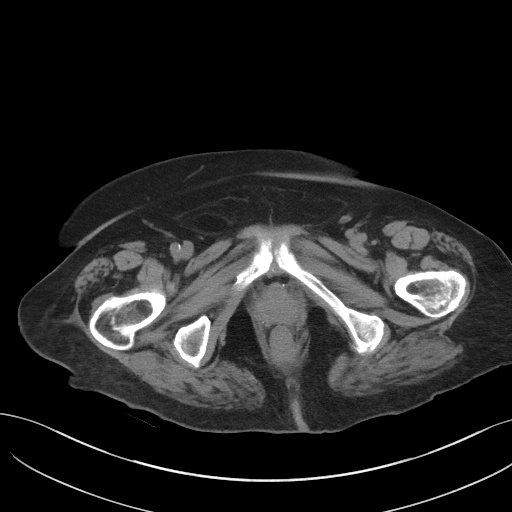
[im 20/94  soft-tissue]
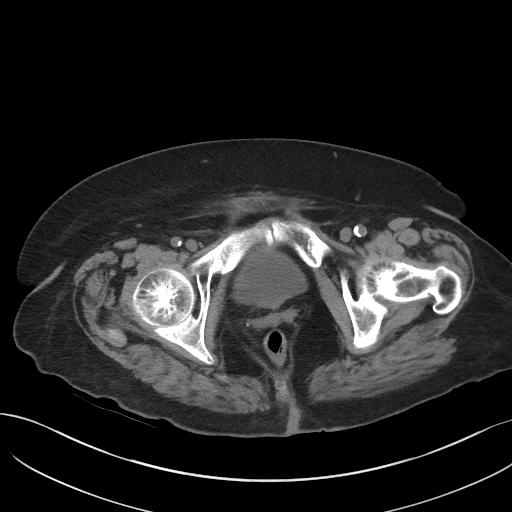
[im 25/94  soft-tissue]
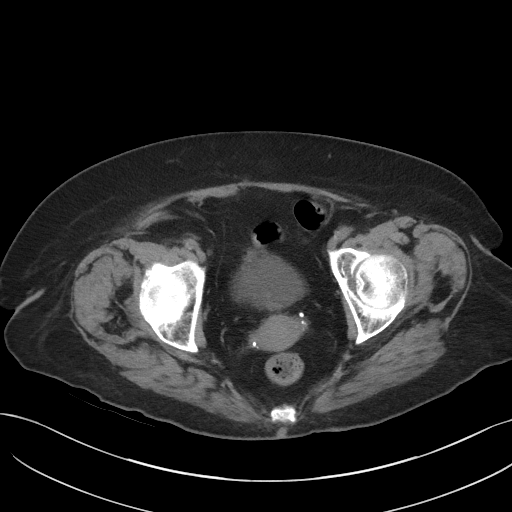
[im 35/94  soft-tissue]
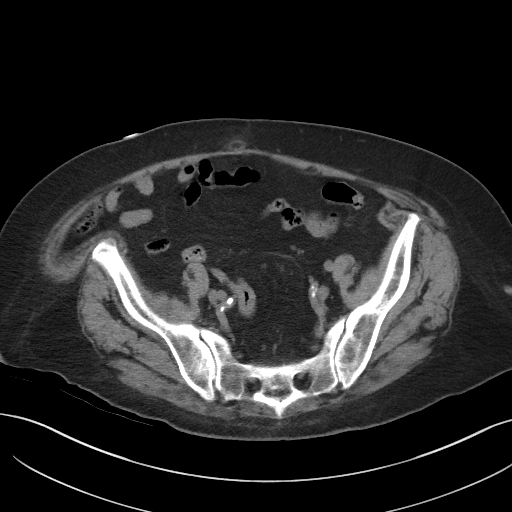
[im 40/94  soft-tissue]
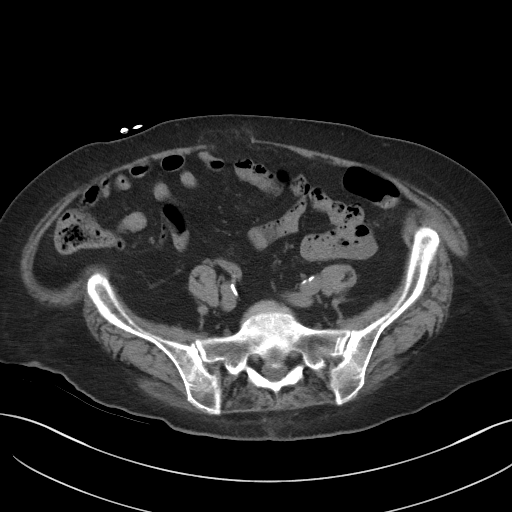
[im 49/94  soft-tissue]
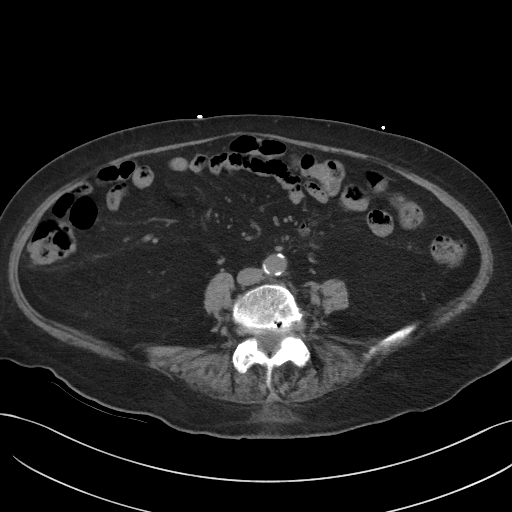
[im 54/94  soft-tissue]
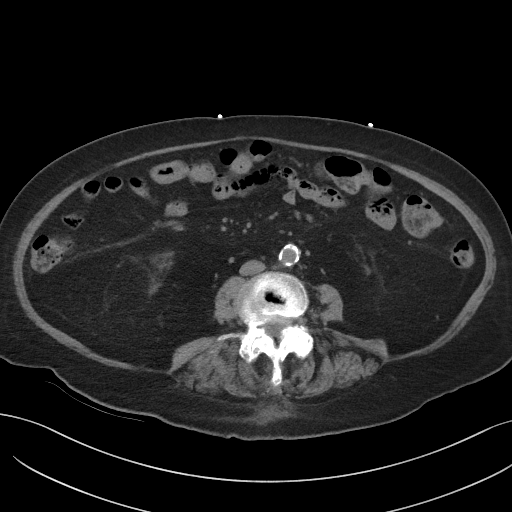
[im 59/94  soft-tissue]
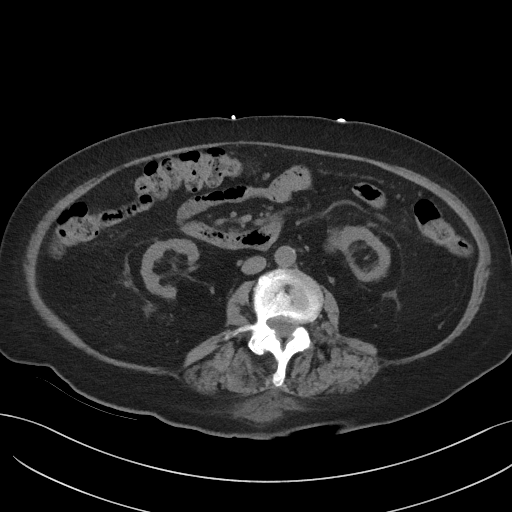
[im 59/94  bone]
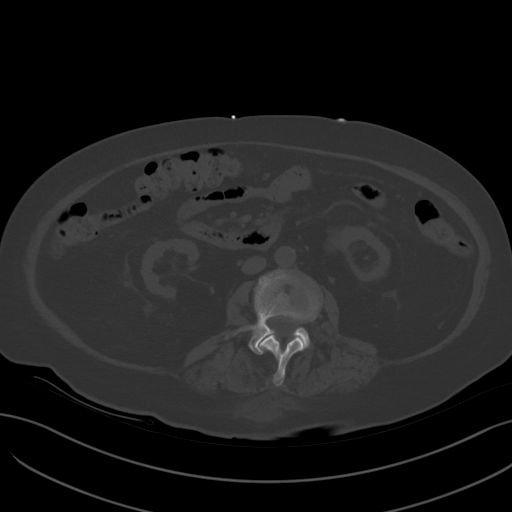
[im 69/94  soft-tissue]
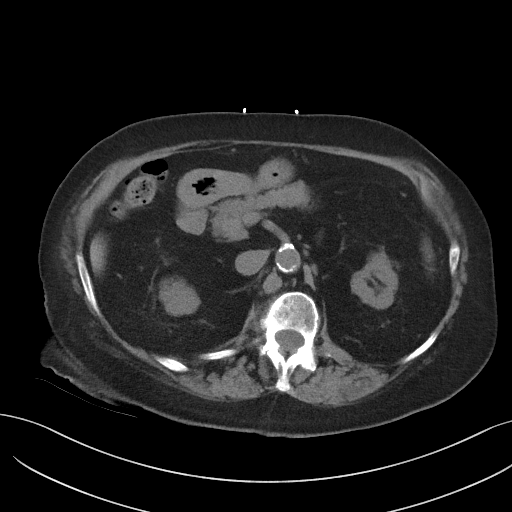
[im 74/94  soft-tissue]
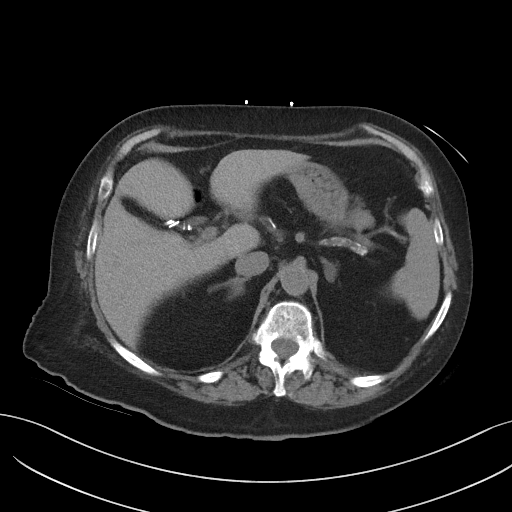
[im 79/94  soft-tissue]
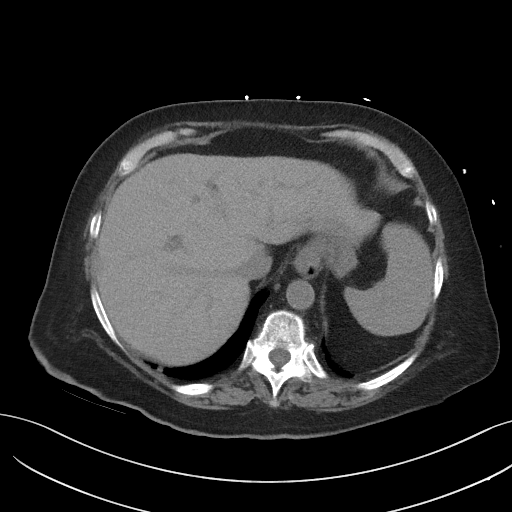
[im 89/94  soft-tissue]
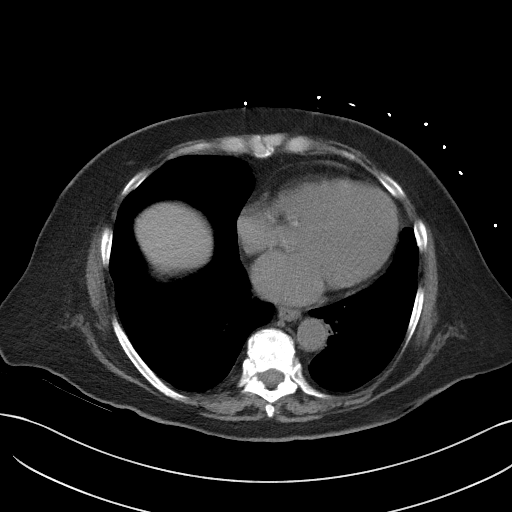

[Series 5: coronal st · coronal · 0.90mm/px · 3 of 101 slices shown]
[im 34/101  soft-tissue]
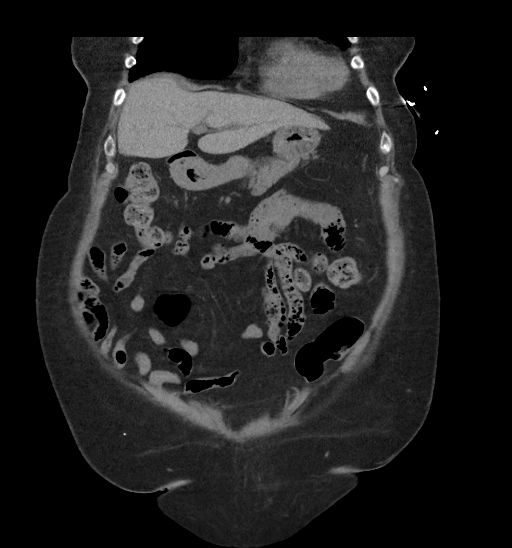
[im 45/101  soft-tissue]
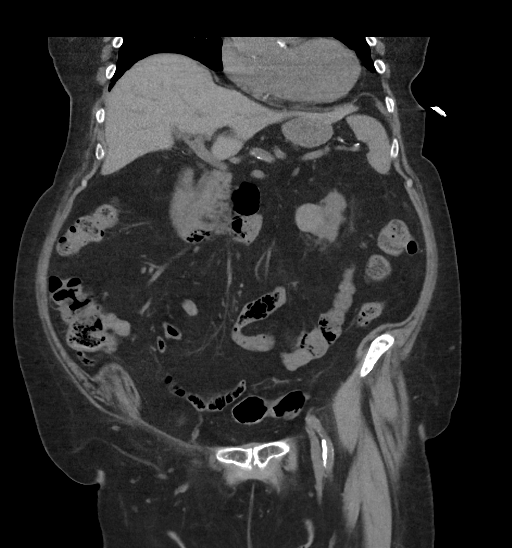
[im 56/101  soft-tissue]
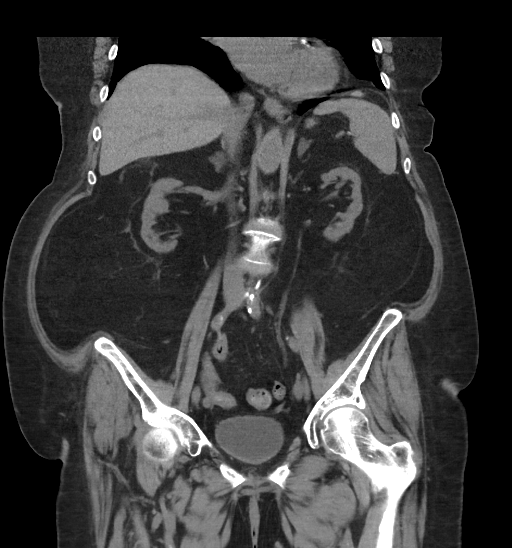

[16 of 46 positions shown; findings below may reference images not displayed]

FINDINGS: Lower chest: No acute abnormality.

Hepatobiliary: No focal liver abnormality is seen. Status post
cholecystectomy. No biliary dilatation.

Pancreas: Unremarkable. No pancreatic ductal dilatation or
surrounding inflammatory changes.

Spleen: Normal in size without focal abnormality.

Adrenals/Urinary Tract: A 1.8 cm x 2.0 cm low-attenuation right
adrenal mass is seen. A 1.3 cm x 0.9 cm low-attenuation left adrenal
mass is also noted. The kidneys are normal in size with mild diffuse
bilateral renal cortical thinning. A 3.0 cm x 3.2 cm lobulated
isodense area is seen within the anteromedial aspect of the mid left
kidney. There is no evidence of renal calculi or hydronephrosis.
Bladder is unremarkable.

Stomach/Bowel: There is a small hiatal hernia. The appendix is not
clearly identified. No evidence of bowel wall thickening,
distention, or inflammatory changes.

Vascular/Lymphatic: Marked severity aortic atherosclerosis. No
enlarged abdominal or pelvic lymph nodes.

Reproductive: Uterus and bilateral adnexa are unremarkable.

Other: A 4.5 cm x 2.2 cm fat-containing ventral hernia is seen just
above the level of the umbilicus.

Musculoskeletal: Multilevel degenerative changes seen throughout the
lumbar spine.
IMPRESSION: 1. No evidence of urinary calculus or hydronephrosis.
2. 3.0 cm lobulated isodense area within the left kidney. Malignancy
is not excluded. MRI with and without contrast is recommended.
3. Bilateral low-attenuation adrenal masses, likely benign.
4. 4.5 cm fat-containing ventral hernia just above the level of the
umbilicus.

Aortic Atherosclerosis ([id number]).
